# Patient Record
Sex: Female | Born: 1960 | Race: White | Hispanic: No | Marital: Single | Smoking: Never smoker
Health system: Southern US, Community
[De-identification: ages and names within clinical notes are randomized; demographics above are authoritative.]

## PROBLEM LIST (undated history)

## (undated) DIAGNOSIS — G4700 Insomnia, unspecified: Secondary | ICD-10-CM

## (undated) DIAGNOSIS — K219 Gastro-esophageal reflux disease without esophagitis: Secondary | ICD-10-CM

## (undated) DIAGNOSIS — I10 Essential (primary) hypertension: Secondary | ICD-10-CM

## (undated) DIAGNOSIS — C801 Malignant (primary) neoplasm, unspecified: Secondary | ICD-10-CM

## (undated) DIAGNOSIS — J302 Other seasonal allergic rhinitis: Secondary | ICD-10-CM

## (undated) DIAGNOSIS — Z923 Personal history of irradiation: Secondary | ICD-10-CM

## (undated) DIAGNOSIS — Z9221 Personal history of antineoplastic chemotherapy: Secondary | ICD-10-CM

## (undated) HISTORY — PX: FOOT SURGERY: SHX648

## (undated) HISTORY — PX: CYST REMOVAL NECK: SHX6281

---

## 1999-12-04 ENCOUNTER — Encounter: Admission: RE | Admit: 1999-12-04 | Discharge: 1999-12-04 | Payer: Self-pay | Admitting: Obstetrics and Gynecology

## 1999-12-04 ENCOUNTER — Encounter: Payer: Self-pay | Admitting: Obstetrics and Gynecology

## 1999-12-08 ENCOUNTER — Encounter: Payer: Self-pay | Admitting: Obstetrics and Gynecology

## 1999-12-08 ENCOUNTER — Encounter: Admission: RE | Admit: 1999-12-08 | Discharge: 1999-12-08 | Payer: Self-pay | Admitting: Obstetrics and Gynecology

## 2000-01-08 ENCOUNTER — Other Ambulatory Visit: Admission: RE | Admit: 2000-01-08 | Discharge: 2000-01-08 | Payer: Self-pay | Admitting: Obstetrics and Gynecology

## 2000-01-09 ENCOUNTER — Encounter: Payer: Self-pay | Admitting: Obstetrics and Gynecology

## 2000-01-09 ENCOUNTER — Ambulatory Visit (HOSPITAL_COMMUNITY): Admission: RE | Admit: 2000-01-09 | Discharge: 2000-01-09 | Payer: Self-pay | Admitting: Obstetrics and Gynecology

## 2000-03-20 ENCOUNTER — Ambulatory Visit (HOSPITAL_COMMUNITY): Admission: RE | Admit: 2000-03-20 | Discharge: 2000-03-20 | Payer: Self-pay | Admitting: Obstetrics and Gynecology

## 2000-03-20 ENCOUNTER — Encounter: Payer: Self-pay | Admitting: Obstetrics and Gynecology

## 2000-04-04 ENCOUNTER — Other Ambulatory Visit: Admission: RE | Admit: 2000-04-04 | Discharge: 2000-04-04 | Payer: Self-pay | Admitting: Obstetrics and Gynecology

## 2000-04-04 ENCOUNTER — Encounter (INDEPENDENT_AMBULATORY_CARE_PROVIDER_SITE_OTHER): Payer: Self-pay | Admitting: Specialist

## 2001-03-07 ENCOUNTER — Other Ambulatory Visit: Admission: RE | Admit: 2001-03-07 | Discharge: 2001-03-07 | Payer: Self-pay | Admitting: Obstetrics and Gynecology

## 2002-01-16 ENCOUNTER — Encounter: Payer: Self-pay | Admitting: Obstetrics and Gynecology

## 2002-01-16 ENCOUNTER — Encounter: Admission: RE | Admit: 2002-01-16 | Discharge: 2002-01-16 | Payer: Self-pay | Admitting: Obstetrics and Gynecology

## 2002-05-22 ENCOUNTER — Other Ambulatory Visit: Admission: RE | Admit: 2002-05-22 | Discharge: 2002-05-22 | Payer: Self-pay | Admitting: Obstetrics and Gynecology

## 2003-03-24 ENCOUNTER — Encounter: Admission: RE | Admit: 2003-03-24 | Discharge: 2003-03-24 | Payer: Self-pay | Admitting: Obstetrics and Gynecology

## 2003-05-28 ENCOUNTER — Other Ambulatory Visit: Admission: RE | Admit: 2003-05-28 | Discharge: 2003-05-28 | Payer: Self-pay | Admitting: Obstetrics and Gynecology

## 2004-06-12 ENCOUNTER — Encounter: Admission: RE | Admit: 2004-06-12 | Discharge: 2004-06-12 | Payer: Self-pay | Admitting: Obstetrics and Gynecology

## 2004-08-11 ENCOUNTER — Other Ambulatory Visit: Admission: RE | Admit: 2004-08-11 | Discharge: 2004-08-11 | Payer: Self-pay | Admitting: Obstetrics and Gynecology

## 2005-07-27 ENCOUNTER — Encounter: Admission: RE | Admit: 2005-07-27 | Discharge: 2005-07-27 | Payer: Self-pay | Admitting: Obstetrics and Gynecology

## 2005-10-12 ENCOUNTER — Other Ambulatory Visit: Admission: RE | Admit: 2005-10-12 | Discharge: 2005-10-12 | Payer: Self-pay | Admitting: Obstetrics and Gynecology

## 2006-09-13 ENCOUNTER — Encounter: Admission: RE | Admit: 2006-09-13 | Discharge: 2006-09-13 | Payer: Self-pay | Admitting: Obstetrics and Gynecology

## 2007-10-14 ENCOUNTER — Encounter: Admission: RE | Admit: 2007-10-14 | Discharge: 2007-10-14 | Payer: Self-pay | Admitting: Obstetrics and Gynecology

## 2009-05-10 ENCOUNTER — Encounter: Admission: RE | Admit: 2009-05-10 | Discharge: 2009-05-10 | Payer: Self-pay | Admitting: Obstetrics and Gynecology

## 2010-05-24 ENCOUNTER — Other Ambulatory Visit: Payer: Self-pay | Admitting: Obstetrics and Gynecology

## 2010-05-24 DIAGNOSIS — Z1231 Encounter for screening mammogram for malignant neoplasm of breast: Secondary | ICD-10-CM

## 2010-05-31 ENCOUNTER — Ambulatory Visit
Admission: RE | Admit: 2010-05-31 | Discharge: 2010-05-31 | Disposition: A | Discharge status: Home / Self Care | Payer: BC Managed Care – PPO | Source: Ambulatory Visit | Attending: Obstetrics and Gynecology | Admitting: Obstetrics and Gynecology

## 2010-05-31 DIAGNOSIS — Z1231 Encounter for screening mammogram for malignant neoplasm of breast: Secondary | ICD-10-CM

## 2011-06-27 ENCOUNTER — Other Ambulatory Visit: Payer: Self-pay | Admitting: Obstetrics and Gynecology

## 2011-06-27 DIAGNOSIS — Z1231 Encounter for screening mammogram for malignant neoplasm of breast: Secondary | ICD-10-CM

## 2011-07-02 ENCOUNTER — Other Ambulatory Visit: Payer: Self-pay | Admitting: Obstetrics and Gynecology

## 2011-07-02 DIAGNOSIS — Z780 Asymptomatic menopausal state: Secondary | ICD-10-CM

## 2011-07-13 ENCOUNTER — Ambulatory Visit: Payer: BC Managed Care – PPO

## 2011-07-20 ENCOUNTER — Ambulatory Visit
Admission: RE | Admit: 2011-07-20 | Discharge: 2011-07-20 | Disposition: A | Discharge status: Home / Self Care | Payer: Managed Care, Other (non HMO) | Source: Ambulatory Visit | Attending: Obstetrics and Gynecology | Admitting: Obstetrics and Gynecology

## 2011-07-20 DIAGNOSIS — Z1231 Encounter for screening mammogram for malignant neoplasm of breast: Secondary | ICD-10-CM

## 2011-07-20 DIAGNOSIS — Z780 Asymptomatic menopausal state: Secondary | ICD-10-CM

## 2012-06-30 ENCOUNTER — Other Ambulatory Visit: Payer: Self-pay

## 2012-06-30 DIAGNOSIS — Z1231 Encounter for screening mammogram for malignant neoplasm of breast: Secondary | ICD-10-CM

## 2012-07-21 ENCOUNTER — Ambulatory Visit
Admission: RE | Admit: 2012-07-21 | Discharge: 2012-07-21 | Disposition: A | Discharge status: Home / Self Care | Payer: Managed Care, Other (non HMO) | Source: Ambulatory Visit

## 2012-07-21 DIAGNOSIS — Z1231 Encounter for screening mammogram for malignant neoplasm of breast: Secondary | ICD-10-CM

## 2013-07-02 ENCOUNTER — Other Ambulatory Visit: Payer: Self-pay

## 2013-07-02 DIAGNOSIS — Z1231 Encounter for screening mammogram for malignant neoplasm of breast: Secondary | ICD-10-CM

## 2013-07-27 ENCOUNTER — Encounter (INDEPENDENT_AMBULATORY_CARE_PROVIDER_SITE_OTHER): Payer: Self-pay

## 2013-07-27 ENCOUNTER — Ambulatory Visit
Admission: RE | Admit: 2013-07-27 | Discharge: 2013-07-27 | Disposition: A | Discharge status: Home / Self Care | Payer: BC Managed Care – PPO | Source: Ambulatory Visit

## 2013-07-27 DIAGNOSIS — Z1231 Encounter for screening mammogram for malignant neoplasm of breast: Secondary | ICD-10-CM

## 2014-08-16 ENCOUNTER — Other Ambulatory Visit: Payer: Self-pay

## 2014-08-16 DIAGNOSIS — Z803 Family history of malignant neoplasm of breast: Secondary | ICD-10-CM

## 2014-08-16 DIAGNOSIS — Z1231 Encounter for screening mammogram for malignant neoplasm of breast: Secondary | ICD-10-CM

## 2014-09-27 ENCOUNTER — Ambulatory Visit
Admission: RE | Admit: 2014-09-27 | Discharge: 2014-09-27 | Disposition: A | Discharge status: Home / Self Care | Payer: BLUE CROSS/BLUE SHIELD | Source: Ambulatory Visit

## 2014-09-27 DIAGNOSIS — Z803 Family history of malignant neoplasm of breast: Secondary | ICD-10-CM

## 2014-09-27 DIAGNOSIS — Z1231 Encounter for screening mammogram for malignant neoplasm of breast: Secondary | ICD-10-CM

## 2015-11-11 ENCOUNTER — Other Ambulatory Visit: Payer: Self-pay | Admitting: Obstetrics and Gynecology

## 2015-11-11 DIAGNOSIS — Z1231 Encounter for screening mammogram for malignant neoplasm of breast: Secondary | ICD-10-CM

## 2015-12-29 ENCOUNTER — Ambulatory Visit
Admission: RE | Admit: 2015-12-29 | Discharge: 2015-12-29 | Disposition: A | Discharge status: Home / Self Care | Payer: BLUE CROSS/BLUE SHIELD | Source: Ambulatory Visit | Attending: Obstetrics and Gynecology | Admitting: Obstetrics and Gynecology

## 2015-12-29 DIAGNOSIS — Z1231 Encounter for screening mammogram for malignant neoplasm of breast: Secondary | ICD-10-CM

## 2016-11-19 ENCOUNTER — Other Ambulatory Visit: Payer: Self-pay | Admitting: Obstetrics and Gynecology

## 2016-11-19 DIAGNOSIS — Z139 Encounter for screening, unspecified: Secondary | ICD-10-CM

## 2016-12-31 ENCOUNTER — Ambulatory Visit
Admission: RE | Admit: 2016-12-31 | Discharge: 2016-12-31 | Disposition: A | Discharge status: Home / Self Care | Payer: BLUE CROSS/BLUE SHIELD | Source: Ambulatory Visit | Attending: Obstetrics and Gynecology | Admitting: Obstetrics and Gynecology

## 2016-12-31 DIAGNOSIS — Z139 Encounter for screening, unspecified: Secondary | ICD-10-CM

## 2018-01-15 ENCOUNTER — Other Ambulatory Visit: Payer: Self-pay | Admitting: Obstetrics and Gynecology

## 2018-01-15 DIAGNOSIS — Z1231 Encounter for screening mammogram for malignant neoplasm of breast: Secondary | ICD-10-CM

## 2018-02-24 ENCOUNTER — Ambulatory Visit: Payer: 59

## 2018-02-24 ENCOUNTER — Ambulatory Visit
Admission: RE | Admit: 2018-02-24 | Discharge: 2018-02-24 | Disposition: A | Discharge status: Home / Self Care | Payer: Managed Care, Other (non HMO) | Source: Ambulatory Visit | Attending: Obstetrics and Gynecology | Admitting: Obstetrics and Gynecology

## 2018-02-24 DIAGNOSIS — Z1231 Encounter for screening mammogram for malignant neoplasm of breast: Secondary | ICD-10-CM

## 2018-02-26 ENCOUNTER — Other Ambulatory Visit: Payer: Self-pay | Admitting: Obstetrics and Gynecology

## 2018-02-26 DIAGNOSIS — R928 Other abnormal and inconclusive findings on diagnostic imaging of breast: Secondary | ICD-10-CM

## 2018-02-28 ENCOUNTER — Ambulatory Visit
Admission: RE | Admit: 2018-02-28 | Discharge: 2018-02-28 | Disposition: A | Discharge status: Home / Self Care | Payer: Managed Care, Other (non HMO) | Source: Ambulatory Visit | Attending: Obstetrics and Gynecology | Admitting: Obstetrics and Gynecology

## 2018-02-28 ENCOUNTER — Other Ambulatory Visit: Payer: Self-pay | Admitting: Obstetrics and Gynecology

## 2018-02-28 DIAGNOSIS — R928 Other abnormal and inconclusive findings on diagnostic imaging of breast: Secondary | ICD-10-CM

## 2018-02-28 DIAGNOSIS — N6310 Unspecified lump in the right breast, unspecified quadrant: Secondary | ICD-10-CM

## 2018-03-02 DIAGNOSIS — C801 Malignant (primary) neoplasm, unspecified: Secondary | ICD-10-CM

## 2018-03-02 HISTORY — DX: Malignant (primary) neoplasm, unspecified: C80.1

## 2018-03-04 ENCOUNTER — Other Ambulatory Visit: Payer: Managed Care, Other (non HMO)

## 2018-03-06 ENCOUNTER — Ambulatory Visit
Admission: RE | Admit: 2018-03-06 | Discharge: 2018-03-06 | Disposition: A | Discharge status: Home / Self Care | Payer: Managed Care, Other (non HMO) | Source: Ambulatory Visit | Attending: Obstetrics and Gynecology | Admitting: Obstetrics and Gynecology

## 2018-03-06 DIAGNOSIS — N6310 Unspecified lump in the right breast, unspecified quadrant: Secondary | ICD-10-CM

## 2018-03-17 ENCOUNTER — Other Ambulatory Visit: Payer: Self-pay | Admitting: General Surgery

## 2018-03-17 ENCOUNTER — Other Ambulatory Visit: Payer: Self-pay

## 2018-03-17 ENCOUNTER — Encounter (HOSPITAL_BASED_OUTPATIENT_CLINIC_OR_DEPARTMENT_OTHER)
Admission: RE | Admit: 2018-03-17 | Discharge: 2018-03-17 | Disposition: A | Discharge status: Home / Self Care | Payer: Managed Care, Other (non HMO) | Source: Ambulatory Visit | Attending: General Surgery | Admitting: General Surgery

## 2018-03-17 ENCOUNTER — Encounter (HOSPITAL_BASED_OUTPATIENT_CLINIC_OR_DEPARTMENT_OTHER): Payer: Self-pay | Admitting: *Deleted

## 2018-03-17 DIAGNOSIS — C50311 Malignant neoplasm of lower-inner quadrant of right female breast: Secondary | ICD-10-CM

## 2018-03-17 DIAGNOSIS — Z01818 Encounter for other preprocedural examination: Secondary | ICD-10-CM | POA: Diagnosis not present

## 2018-03-17 LAB — BASIC METABOLIC PANEL
Anion gap: 9 (ref 5–15)
BUN: 14 mg/dL (ref 6–20)
CO2: 26 mmol/L (ref 22–32)
Calcium: 9.7 mg/dL (ref 8.9–10.3)
Chloride: 101 mmol/L (ref 98–111)
Creatinine, Ser: 0.76 mg/dL (ref 0.44–1.00)
GFR calc Af Amer: >60 mL/min (ref >60)
GFR calc non Af Amer: >60 mL/min (ref >60)
Glucose, Bld: 125 mg/dL — ABNORMAL HIGH (ref 70–99)
Potassium: 2.9 mmol/L — ABNORMAL LOW (ref 3.5–5.1)
Sodium: 136 mmol/L (ref 135–145)

## 2018-03-17 NOTE — Progress Notes (Signed)
Instructed Kim Montgomery to drink Ensure by 1030 day of surgery with teach back method. Instructed on using CHG soap as well.

## 2018-03-18 ENCOUNTER — Telehealth: Payer: Self-pay | Admitting: Hematology and Oncology

## 2018-03-18 NOTE — Progress Notes (Signed)
K+ 2.9, Dr. Eligha Bridegroom instructed pt to take potassium chloride SA TID until surgery, notified pt.  Pt verbalized understanding. EKG reviewed, will proceed with surgery as scheduled.

## 2018-03-18 NOTE — Telephone Encounter (Signed)
Pt has been cld and scheduled to see Dr. Lindi Adie on 3/18 at 330pm. Pt aware to arrive 15 minutes early.

## 2018-03-18 NOTE — Progress Notes (Signed)
[facility] CONSULT NOTE  Patient Care Team: [name], MD as PCP - General (Family Medicine)  CHIEF COMPLAINTS/PURPOSE OF CONSULTATION: Newly diagnosed breast cancer  HISTORY OF PRESENTING ILLNESS:  Kim Montgomery 58 y.o. female is here because of recent diagnosis of right breast cancer.  The cancer was detected on a screening mammogram on 02/24/18. A diagnostic mammogram on 02/28/18 showed an 6m mass in the right breast. A biopsy from 03/06/18 showed the cancer to be grade 2 IDC with DCIS, HER2 positive, ER 100%, PR 1%, Ki67 30%.   She presents to the clinic today to discuss her treatment plan.  She met with Dr. WDonne Hazelwho plans for surgery next Friday.   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  She reports/denies a family history of breast cancer. Her lumpectomy is shceduled for 3/23/20with Dr. WDonne Hazel   I reviewed her records extensively and collaborated the history with the patient.  SUMMARY OF ONCOLOGIC HISTORY:   Malignant neoplasm of lower-outer quadrant of right breast of female, estrogen receptor positive ([place]   03/06/2018 Initial Diagnosis    Screening mammogram detected right breast calcifications, 8 mm mass detected by ultrasound, axilla negative, biopsy revealed grade 2 IDC with high-grade DCIS, ER 100%, PR 1%, Ki-67 30%, HER-2 +3+ by IHC, T1BN0 stage Ia    03/19/2018 Cancer Staging    Staging form: Breast, AJCC 8th Edition - Clinical stage from 03/19/2018: Stage IA (cT1b, cN0, cM0, G2, ER+, PR+, HER2+) - Signed by GNicholas Lose MD on 03/19/2018      MEDICAL HISTORY:  Past Medical History:  Diagnosis Date  . Cancer ([place] 03/2018   right breast cancer  . GERD (gastroesophageal reflux disease)   . Hypertension   . Insomnia   . Seasonal allergies     SURGICAL HISTORY: Past Surgical History:  Procedure Laterality Date  . CYST REMOVAL NECK     at age 58 . FOOT SURGERY Right     SOCIAL  HISTORY: Social History   Socioeconomic History  . Marital status: Single    Spouse name: Not on file  . Number of children: Not on file  . Years of education: Not on file  . Highest education level: Not on file  Occupational History  . Not on file  Social Needs  . Financial resource strain: Not on file  . Food insecurity:    Worry: Not on file    Inability: Not on file  . Transportation needs:    Medical: Not on file    Non-medical: Not on file  Tobacco Use  . Smoking status: Never Smoker  . Smokeless tobacco: Never Used  Substance and Sexual Activity  . Alcohol use: Yes    Comment: social  . Drug use: Never  . Sexual activity: Not on file  Lifestyle  . Physical activity:    Days per week: Not on file    Minutes per session: Not on file  . Stress: Not on file  Relationships  . Social connections:    Talks on phone: Not on file    Gets together: Not on file    Attends religious service: Not on file    Active member of club or organization: Not on file    Attends meetings of clubs or organizations: Not on file    Relationship status: Not on file  . Intimate partner violence:    Fear of current or ex  partner: Not on file    Emotionally abused: Not on file    Physically abused: Not on file    Forced sexual activity: Not on file  Other Topics Concern  . Not on file  Social History Narrative  . Not on file    FAMILY HISTORY: Family History  Problem Relation Age of Onset  . Breast cancer Sister        early to mid 94's    ALLERGIES:  has No Known Allergies.  MEDICATIONS:  Current Outpatient Medications  Medication Sig Dispense Refill  . albuterol (PROVENTIL HFA;VENTOLIN HFA) 108 (90 Base) MCG/ACT inhaler Inhale into the lungs every 6 (six) hours as needed for wheezing or shortness of breath.    [name] atenolol-chlorthalidone (TENORETIC) 100-25 MG tablet Take 1 tablet by mouth daily.    . clonazePAM (KLONOPIN) 0.5 MG tablet Take 0.5 mg by mouth at bedtime.    [name]  omeprazole (PRILOSEC) 20 MG capsule Take 20 mg by mouth daily.    . potassium chloride SA (K-DUR,KLOR-CON) 20 MEQ tablet Take 20 mEq by mouth 2 (two) times daily.     No current facility-administered medications for this visit.     REVIEW OF SYSTEMS:   Constitutional: Denies fevers, chills or abnormal night sweats Eyes: Denies blurriness of vision, double vision or watery eyes Ears, nose, mouth, throat, and face: Denies mucositis or sore throat Respiratory: Denies cough, dyspnea or wheezes Cardiovascular: Denies palpitation, chest discomfort or lower extremity swelling Gastrointestinal:  Denies nausea, heartburn or change in bowel habits Skin: Denies abnormal skin rashes Lymphatics: Denies new lymphadenopathy or easy bruising Neurological:Denies numbness, tingling or new weaknesses Behavioral/Psych: Mood is stable, no new changes  Breast:  Denies any palpable lumps or discharge All other systems were reviewed with the patient and are negative.  PHYSICAL EXAMINATION: ECOG PERFORMANCE STATUS: 0 - Asymptomatic  Vitals:   03/19/18 1538  BP: 122/72  Pulse: 74  Resp: 18  Temp: 99 F (37.2 C)  SpO2: 97%   Filed Weights   03/19/18 1538  Weight: 156 lb 8 oz (71 kg)    GENERAL:alert, no distress and comfortable SKIN: skin color, texture, turgor are normal, no rashes or significant lesions EYES: normal, conjunctiva are pink and non-injected, sclera clear OROPHARYNX:no exudate, no erythema and lips, buccal mucosa, and tongue normal  NECK: supple, thyroid normal size, non-tender, without nodularity LYMPH:  no palpable lymphadenopathy in the cervical, axillary or inguinal LUNGS: clear to auscultation and percussion with normal breathing effort HEART: regular rate & rhythm and no murmurs and no lower extremity edema ABDOMEN:abdomen soft, non-tender and normal bowel sounds Musculoskeletal:no cyanosis of digits and no clubbing  PSYCH: alert & oriented x 3 with fluent speech NEURO: no  focal motor/sensory deficits BREAST: No palpable nodules in breast. No palpable axillary or supraclavicular lymphadenopathy (exam performed in the presence of a chaperone)   LABORATORY DATA:  I have reviewed the data as listed No results found for: WBC, HGB, HCT, MCV, PLT Lab Results  Component Value Date   NA 136 03/17/2018   K 2.9 (L) 03/17/2018   CL 101 03/17/2018   CO2 26 03/17/2018    RADIOGRAPHIC STUDIES: I have personally reviewed the radiological reports and agreed with the findings in the report.  ASSESSMENT AND PLAN:  Malignant neoplasm of lower-outer quadrant of right breast of female, estrogen receptor positive ([place]) 03/06/2018:Screening mammogram detected right breast calcifications, 8 mm mass detected by ultrasound, axilla negative, biopsy revealed grade 2 IDC with high-grade DCIS,  ER 100%, PR 1%, Ki-67 30%, HER-2 +3+ by IHC, T1BN0 stage Ia  Pathology and radiology counseling: Discussed with the patient, the details of pathology including the type of breast cancer,the clinical staging, the significance of ER, PR and HER-2/neu receptors and the implications for treatment. After reviewing the pathology in detail, we proceeded to discuss the different treatment options between surgery, radiation, chemotherapy, antiestrogen therapies.  Recommendation: 1.  Breast conserving surgery with sentinel lymph node biopsy 2.  Adjuvant chemotherapy with Herceptin followed by 1 year of Herceptin maintenance if the final tumor is greater than 5 mm. 3.  Adjuvant radiation therapy 4.  Followed by adjuvant antiestrogen therapy  I briefly discussed the risks and benefits of chemotherapy with Taxol Herceptin and Herceptin maintenance.  We will await the results of the final surgery to make a determination on benefits of chemo. Patient is very keen on receiving chemotherapy and she has been planning for working through the chemo by working from home. Return to clinic after surgery to discuss her  treatment plan.    All questions were answered. The patient knows to call the clinic with any problems, questions or concerns.   Nicholas Lose, MD 03/19/2018   I, Cloyde Reams Dorshimer, am acting as scribe for Nicholas Lose, MD.  I have reviewed the above documentation for accuracy and completeness, and I agree with the above.

## 2018-03-19 ENCOUNTER — Other Ambulatory Visit: Payer: Self-pay

## 2018-03-19 ENCOUNTER — Inpatient Hospital Stay: Payer: Managed Care, Other (non HMO) | Attending: Hematology and Oncology | Admitting: Hematology and Oncology

## 2018-03-19 DIAGNOSIS — I10 Essential (primary) hypertension: Secondary | ICD-10-CM | POA: Insufficient documentation

## 2018-03-19 DIAGNOSIS — Z170 Estrogen receptor positive status [ER+]: Secondary | ICD-10-CM | POA: Diagnosis not present

## 2018-03-19 DIAGNOSIS — Z79899 Other long term (current) drug therapy: Secondary | ICD-10-CM | POA: Insufficient documentation

## 2018-03-19 DIAGNOSIS — C50511 Malignant neoplasm of lower-outer quadrant of right female breast: Secondary | ICD-10-CM | POA: Diagnosis not present

## 2018-03-19 NOTE — Assessment & Plan Note (Signed)
03/06/2018:Screening mammogram detected right breast calcifications, 8 mm mass detected by ultrasound, axilla negative, biopsy revealed grade 2 IDC with high-grade DCIS, ER 100%, PR 1%, Ki-67 30%, HER-2 +3+ by IHC, T1BN0 stage Ia  Pathology and radiology counseling: Discussed with the patient, the details of pathology including the type of breast cancer,the clinical staging, the significance of ER, PR and HER-2/neu receptors and the implications for treatment. After reviewing the pathology in detail, we proceeded to discuss the different treatment options between surgery, radiation, chemotherapy, antiestrogen therapies.  Recommendation: 1.  Breast conserving surgery with sentinel lymph node biopsy 2.  Adjuvant chemotherapy with Herceptin followed by 1 year of Herceptin maintenance if the final tumor is greater than 5 mm. 3.  Adjuvant radiation therapy 4.  Followed by adjuvant antiestrogen therapy  I briefly discussed the risks and benefits of chemotherapy with Taxol Herceptin and Herceptin maintenance.  We will await the results of the final surgery to make a determination on benefits of chemo.  Return to clinic after surgery to discuss her treatment plan.

## 2018-03-20 ENCOUNTER — Encounter: Payer: Self-pay | Admitting: *Deleted

## 2018-03-21 ENCOUNTER — Ambulatory Visit
Admission: RE | Admit: 2018-03-21 | Discharge: 2018-03-21 | Disposition: A | Discharge status: Home / Self Care | Payer: Managed Care, Other (non HMO) | Source: Ambulatory Visit | Attending: General Surgery | Admitting: General Surgery

## 2018-03-21 ENCOUNTER — Inpatient Hospital Stay: Admission: RE | Admit: 2018-03-21 | Payer: Managed Care, Other (non HMO) | Source: Ambulatory Visit

## 2018-03-21 ENCOUNTER — Other Ambulatory Visit: Payer: Self-pay

## 2018-03-21 DIAGNOSIS — C50311 Malignant neoplasm of lower-inner quadrant of right female breast: Secondary | ICD-10-CM

## 2018-03-23 NOTE — Anesthesia Preprocedure Evaluation (Addendum)
Anesthesia Evaluation  Patient identified by MRN, date of birth, ID band Patient awake    Reviewed: Allergy & Precautions, NPO status , Patient's Chart, lab work & pertinent test results  Airway Mallampati: II  TM Distance: >3 FB Neck ROM: Full    Dental no notable dental hx. (+) Teeth Intact, Dental Advisory Given   Pulmonary neg pulmonary ROS,    Pulmonary exam normal breath sounds clear to auscultation       Cardiovascular hypertension, Pt. on medications Normal cardiovascular exam Rhythm:Regular Rate:Normal     Neuro/Psych negative neurological ROS     GI/Hepatic Neg liver ROS,   Endo/Other  negative endocrine ROS  Renal/GU negative Renal ROS     Musculoskeletal   Abdominal   Peds  Hematology   Anesthesia Other Findings   Reproductive/Obstetrics                            Anesthesia Physical Anesthesia Plan  ASA: II  Anesthesia Plan: General   Post-op Pain Management:  Regional for Post-op pain   Induction:   PONV Risk Score and Plan: 3 and Treatment may vary due to age or medical condition and Scopolamine patch - Pre-op  Airway Management Planned: LMA  Additional Equipment:   Intra-op Plan:   Post-operative Plan: Extubation in OR  Informed Consent: I have reviewed the patients History and Physical, chart, labs and discussed the procedure including the risks, benefits and alternatives for the proposed anesthesia with the patient or authorized representative who has indicated his/her understanding and acceptance.     Dental advisory given  Plan Discussed with: CRNA  Anesthesia Plan Comments: (R breast lumpectomy w R axillary w Pecs block)       Anesthesia Quick Evaluation

## 2018-03-24 ENCOUNTER — Ambulatory Visit (HOSPITAL_BASED_OUTPATIENT_CLINIC_OR_DEPARTMENT_OTHER): Payer: Managed Care, Other (non HMO) | Admitting: Anesthesiology

## 2018-03-24 ENCOUNTER — Encounter (HOSPITAL_BASED_OUTPATIENT_CLINIC_OR_DEPARTMENT_OTHER): Payer: Self-pay | Admitting: Certified Registered"

## 2018-03-24 ENCOUNTER — Ambulatory Visit (HOSPITAL_COMMUNITY)
Admission: RE | Admit: 2018-03-24 | Discharge: 2018-03-24 | Disposition: A | Discharge status: Home / Self Care | Payer: Managed Care, Other (non HMO) | Source: Ambulatory Visit | Attending: General Surgery | Admitting: General Surgery

## 2018-03-24 ENCOUNTER — Ambulatory Visit (HOSPITAL_COMMUNITY)
Admission: RE | Admit: 2018-03-24 | Discharge: 2018-03-24 | Disposition: A | Discharge status: Home / Self Care | Payer: Managed Care, Other (non HMO) | Attending: General Surgery | Admitting: General Surgery

## 2018-03-24 ENCOUNTER — Ambulatory Visit
Admission: RE | Admit: 2018-03-24 | Discharge: 2018-03-24 | Disposition: A | Discharge status: Home / Self Care | Payer: Managed Care, Other (non HMO) | Source: Ambulatory Visit | Attending: General Surgery | Admitting: General Surgery

## 2018-03-24 ENCOUNTER — Encounter (HOSPITAL_BASED_OUTPATIENT_CLINIC_OR_DEPARTMENT_OTHER)
Admission: RE | Disposition: A | Discharge status: Home / Self Care | Payer: Self-pay | Source: Home / Self Care | Attending: General Surgery

## 2018-03-24 ENCOUNTER — Other Ambulatory Visit: Payer: Self-pay

## 2018-03-24 DIAGNOSIS — C50911 Malignant neoplasm of unspecified site of right female breast: Secondary | ICD-10-CM | POA: Insufficient documentation

## 2018-03-24 DIAGNOSIS — Z803 Family history of malignant neoplasm of breast: Secondary | ICD-10-CM | POA: Diagnosis not present

## 2018-03-24 DIAGNOSIS — Z79899 Other long term (current) drug therapy: Secondary | ICD-10-CM | POA: Insufficient documentation

## 2018-03-24 DIAGNOSIS — I10 Essential (primary) hypertension: Secondary | ICD-10-CM | POA: Diagnosis not present

## 2018-03-24 DIAGNOSIS — K219 Gastro-esophageal reflux disease without esophagitis: Secondary | ICD-10-CM | POA: Insufficient documentation

## 2018-03-24 DIAGNOSIS — C50311 Malignant neoplasm of lower-inner quadrant of right female breast: Secondary | ICD-10-CM

## 2018-03-24 HISTORY — DX: Gastro-esophageal reflux disease without esophagitis: K21.9

## 2018-03-24 HISTORY — PX: BREAST LUMPECTOMY: SHX2

## 2018-03-24 HISTORY — DX: Other seasonal allergic rhinitis: J30.2

## 2018-03-24 HISTORY — PX: BREAST LUMPECTOMY WITH RADIOACTIVE SEED AND SENTINEL LYMPH NODE BIOPSY: SHX6550

## 2018-03-24 HISTORY — DX: Malignant (primary) neoplasm, unspecified: C80.1

## 2018-03-24 HISTORY — DX: Essential (primary) hypertension: I10

## 2018-03-24 HISTORY — DX: Insomnia, unspecified: G47.00

## 2018-03-24 LAB — BASIC METABOLIC PANEL
Anion gap: 10 (ref 5–15)
BUN: 10 mg/dL (ref 6–20)
CO2: 25 mmol/L (ref 22–32)
Calcium: 9.5 mg/dL (ref 8.9–10.3)
Chloride: 102 mmol/L (ref 98–111)
Creatinine, Ser: 0.75 mg/dL (ref 0.44–1.00)
GFR calc Af Amer: >60 mL/min (ref >60)
GFR calc non Af Amer: >60 mL/min (ref >60)
Glucose, Bld: 84 mg/dL (ref 70–99)
Potassium: 3.3 mmol/L — ABNORMAL LOW (ref 3.5–5.1)
Sodium: 137 mmol/L (ref 135–145)

## 2018-03-24 SURGERY — BREAST LUMPECTOMY WITH RADIOACTIVE SEED AND SENTINEL LYMPH NODE BIOPSY
Anesthesia: General | Site: Breast | Laterality: Right

## 2018-03-24 MED ORDER — SCOPOLAMINE 1 MG/3DAYS TD PT72
1.0000 | MEDICATED_PATCH | Freq: Once | TRANSDERMAL | Status: DC | PRN
  Administered 2018-03-24: 1.5 mg via TRANSDERMAL

## 2018-03-24 MED ORDER — PROPOFOL 10 MG/ML IV BOLUS
INTRAVENOUS | Status: AC
  Filled 2018-03-24: qty 20

## 2018-03-24 MED ORDER — GABAPENTIN 100 MG PO CAPS
ORAL_CAPSULE | ORAL | Status: AC
  Filled 2018-03-24: qty 1

## 2018-03-24 MED ORDER — ACETAMINOPHEN 10 MG/ML IV SOLN: 1000.0000 mg | Freq: Once | INTRAVENOUS | Status: DC | PRN

## 2018-03-24 MED ORDER — EPHEDRINE SULFATE 50 MG/ML IJ SOLN
INTRAMUSCULAR | Status: DC | PRN
  Administered 2018-03-24: 10 mg via INTRAVENOUS

## 2018-03-24 MED ORDER — GABAPENTIN 100 MG PO CAPS
100.0000 mg | ORAL_CAPSULE | ORAL | Status: AC
  Administered 2018-03-24: 100 mg via ORAL

## 2018-03-24 MED ORDER — BUPIVACAINE HCL (PF) 0.25 % IJ SOLN
INTRAMUSCULAR | Status: DC | PRN
  Administered 2018-03-24: 9 mL

## 2018-03-24 MED ORDER — LACTATED RINGERS IV SOLN
INTRAVENOUS | Status: DC
  Administered 2018-03-24: 13:00:00 via INTRAVENOUS

## 2018-03-24 MED ORDER — PROPOFOL 10 MG/ML IV BOLUS
INTRAVENOUS | Status: DC | PRN
  Administered 2018-03-24: 150 mg via INTRAVENOUS

## 2018-03-24 MED ORDER — PROPOFOL 500 MG/50ML IV EMUL
INTRAVENOUS | Status: AC
  Filled 2018-03-24: qty 50

## 2018-03-24 MED ORDER — MIDAZOLAM HCL 2 MG/2ML IJ SOLN
1.0000 mg | INTRAMUSCULAR | Status: DC | PRN
  Administered 2018-03-24: 1 mg via INTRAVENOUS

## 2018-03-24 MED ORDER — MIDAZOLAM HCL 2 MG/2ML IJ SOLN
INTRAMUSCULAR | Status: AC
  Filled 2018-03-24: qty 2

## 2018-03-24 MED ORDER — LIDOCAINE 2% (20 MG/ML) 5 ML SYRINGE
INTRAMUSCULAR | Status: AC
  Filled 2018-03-24: qty 15

## 2018-03-24 MED ORDER — BUPIVACAINE HCL (PF) 0.25 % IJ SOLN
INTRAMUSCULAR | Status: AC
  Filled 2018-03-24: qty 30

## 2018-03-24 MED ORDER — HYDROCODONE-ACETAMINOPHEN 7.5-325 MG PO TABS: 1.0000 | ORAL_TABLET | Freq: Once | ORAL | Status: DC | PRN

## 2018-03-24 MED ORDER — HEPARIN SOD (PORK) LOCK FLUSH 100 UNIT/ML IV SOLN
INTRAVENOUS | Status: AC
  Filled 2018-03-24: qty 5

## 2018-03-24 MED ORDER — DEXAMETHASONE SODIUM PHOSPHATE 10 MG/ML IJ SOLN
INTRAMUSCULAR | Status: AC
  Filled 2018-03-24: qty 2

## 2018-03-24 MED ORDER — PROPOFOL 500 MG/50ML IV EMUL
INTRAVENOUS | Status: DC | PRN
  Administered 2018-03-24: 25 ug/kg/min via INTRAVENOUS

## 2018-03-24 MED ORDER — KETOROLAC TROMETHAMINE 15 MG/ML IJ SOLN
15.0000 mg | INTRAMUSCULAR | Status: AC
  Administered 2018-03-24: 15 mg via INTRAVENOUS

## 2018-03-24 MED ORDER — TECHNETIUM TC 99M SULFUR COLLOID FILTERED
1.0000 | Freq: Once | INTRAVENOUS | Status: AC | PRN
  Administered 2018-03-24: 1 via INTRADERMAL

## 2018-03-24 MED ORDER — HYDROMORPHONE HCL 1 MG/ML IJ SOLN: 0.2500 mg | INTRAMUSCULAR | Status: DC | PRN

## 2018-03-24 MED ORDER — SODIUM CHLORIDE (PF) 0.9 % IJ SOLN
INTRAMUSCULAR | Status: AC
  Filled 2018-03-24: qty 10

## 2018-03-24 MED ORDER — SCOPOLAMINE 1 MG/3DAYS TD PT72
MEDICATED_PATCH | TRANSDERMAL | Status: AC
  Filled 2018-03-24: qty 1

## 2018-03-24 MED ORDER — BUPIVACAINE LIPOSOME 1.3 % IJ SUSP
INTRAMUSCULAR | Status: DC | PRN
  Administered 2018-03-24: 10 mL

## 2018-03-24 MED ORDER — KETOROLAC TROMETHAMINE 15 MG/ML IJ SOLN
INTRAMUSCULAR | Status: AC
  Filled 2018-03-24: qty 1

## 2018-03-24 MED ORDER — CEFAZOLIN SODIUM-DEXTROSE 2-4 GM/100ML-% IV SOLN
INTRAVENOUS | Status: AC
  Filled 2018-03-24: qty 100

## 2018-03-24 MED ORDER — DEXAMETHASONE SODIUM PHOSPHATE 4 MG/ML IJ SOLN
INTRAMUSCULAR | Status: DC | PRN
  Administered 2018-03-24: 10 mg via INTRAVENOUS

## 2018-03-24 MED ORDER — PROMETHAZINE HCL 25 MG/ML IJ SOLN: 6.2500 mg | INTRAMUSCULAR | Status: DC | PRN

## 2018-03-24 MED ORDER — OXYCODONE HCL 5 MG PO TABS: 5.0000 mg | ORAL_TABLET | Freq: Four times a day (QID) | ORAL | 0 refills | Status: DC | PRN

## 2018-03-24 MED ORDER — HEPARIN (PORCINE) IN NACL 1000-0.9 UT/500ML-% IV SOLN
INTRAVENOUS | Status: AC
  Filled 2018-03-24: qty 500

## 2018-03-24 MED ORDER — MEPERIDINE HCL 25 MG/ML IJ SOLN: 6.2500 mg | INTRAMUSCULAR | Status: DC | PRN

## 2018-03-24 MED ORDER — ACETAMINOPHEN 500 MG PO TABS
1000.0000 mg | ORAL_TABLET | ORAL | Status: AC
  Administered 2018-03-24: 1000 mg via ORAL

## 2018-03-24 MED ORDER — LIDOCAINE HCL (CARDIAC) PF 100 MG/5ML IV SOSY
PREFILLED_SYRINGE | INTRAVENOUS | Status: DC | PRN
  Administered 2018-03-24: 30 mg via INTRAVENOUS

## 2018-03-24 MED ORDER — BUPIVACAINE HCL (PF) 0.5 % IJ SOLN
INTRAMUSCULAR | Status: DC | PRN
  Administered 2018-03-24: 16 mL

## 2018-03-24 MED ORDER — FENTANYL CITRATE (PF) 100 MCG/2ML IJ SOLN
50.0000 ug | INTRAMUSCULAR | Status: AC | PRN
  Administered 2018-03-24 (×2): 50 ug via INTRAVENOUS
  Administered 2018-03-24: 25 ug via INTRAVENOUS

## 2018-03-24 MED ORDER — FENTANYL CITRATE (PF) 100 MCG/2ML IJ SOLN
INTRAMUSCULAR | Status: AC
  Filled 2018-03-24: qty 2

## 2018-03-24 MED ORDER — CEFAZOLIN SODIUM-DEXTROSE 2-4 GM/100ML-% IV SOLN
2.0000 g | INTRAVENOUS | Status: AC
  Administered 2018-03-24: 2 g via INTRAVENOUS

## 2018-03-24 MED ORDER — ACETAMINOPHEN 500 MG PO TABS
ORAL_TABLET | ORAL | Status: AC
  Filled 2018-03-24: qty 2

## 2018-03-24 MED ORDER — ONDANSETRON HCL 4 MG/2ML IJ SOLN
INTRAMUSCULAR | Status: AC
  Filled 2018-03-24: qty 8

## 2018-03-24 MED ORDER — METHYLENE BLUE 0.5 % INJ SOLN
INTRAVENOUS | Status: AC
  Filled 2018-03-24: qty 10

## 2018-03-24 SURGICAL SUPPLY — 76 items
ADH SKN CLS APL DERMABOND .7 (GAUZE/BANDAGES/DRESSINGS) ×2
APL PRP STRL LF DISP 70% ISPRP (MISCELLANEOUS) ×2
APL SKNCLS STERI-STRIP NONHPOA (GAUZE/BANDAGES/DRESSINGS) ×2
APPLIER CLIP 9.375 MED OPEN (MISCELLANEOUS)
APR CLP MED 9.3 20 MLT OPN (MISCELLANEOUS)
BAG DECANTER FOR FLEXI CONT (MISCELLANEOUS) ×4 IMPLANT
BENZOIN TINCTURE PRP APPL 2/3 (GAUZE/BANDAGES/DRESSINGS) ×4 IMPLANT
BINDER BREAST LRG (GAUZE/BANDAGES/DRESSINGS) IMPLANT
BINDER BREAST MEDIUM (GAUZE/BANDAGES/DRESSINGS) IMPLANT
BINDER BREAST XLRG (GAUZE/BANDAGES/DRESSINGS) ×3 IMPLANT
BINDER BREAST XXLRG (GAUZE/BANDAGES/DRESSINGS) IMPLANT
BLADE SURG 11 STRL SS (BLADE) ×4 IMPLANT
BLADE SURG 15 STRL LF DISP TIS (BLADE) ×2 IMPLANT
BLADE SURG 15 STRL SS (BLADE) ×4
CANISTER SUC SOCK COL 7IN (MISCELLANEOUS) IMPLANT
CANISTER SUCT 1200ML W/VALVE (MISCELLANEOUS) IMPLANT
CHLORAPREP W/TINT 26 (MISCELLANEOUS) ×4 IMPLANT
CLIP APPLIE 9.375 MED OPEN (MISCELLANEOUS) IMPLANT
CLIP VESOCCLUDE SM WIDE 6/CT (CLIP) ×4 IMPLANT
CLOSURE WOUND 1/2 X4 (GAUZE/BANDAGES/DRESSINGS) ×1
COVER BACK TABLE 60X90IN (DRAPES) ×4 IMPLANT
COVER MAYO STAND STRL (DRAPES) ×4 IMPLANT
COVER PROBE 5X48 (MISCELLANEOUS)
COVER PROBE W GEL 5X96 (DRAPES) ×4 IMPLANT
COVER WAND RF STERILE (DRAPES) IMPLANT
DECANTER SPIKE VIAL GLASS SM (MISCELLANEOUS) IMPLANT
DERMABOND ADVANCED (GAUZE/BANDAGES/DRESSINGS) ×2
DERMABOND ADVANCED .7 DNX12 (GAUZE/BANDAGES/DRESSINGS) ×2 IMPLANT
DRAPE C-ARM 42X72 X-RAY (DRAPES) ×4 IMPLANT
DRAPE LAPAROSCOPIC ABDOMINAL (DRAPES) ×4 IMPLANT
DRAPE UTILITY XL STRL (DRAPES) ×4 IMPLANT
DRSG TEGADERM 4X4.75 (GAUZE/BANDAGES/DRESSINGS) IMPLANT
ELECT COATED BLADE 2.86 ST (ELECTRODE) ×4 IMPLANT
ELECT REM PT RETURN 9FT ADLT (ELECTROSURGICAL) ×4
ELECTRODE REM PT RTRN 9FT ADLT (ELECTROSURGICAL) ×2 IMPLANT
GAUZE SPONGE 4X4 12PLY STRL LF (GAUZE/BANDAGES/DRESSINGS) ×4 IMPLANT
GLOVE BIO SURGEON STRL SZ7 (GLOVE) ×8 IMPLANT
GLOVE BIOGEL PI IND STRL 7.5 (GLOVE) ×6 IMPLANT
GLOVE BIOGEL PI INDICATOR 7.5 (GLOVE) ×6
GOWN STRL REUS W/ TWL LRG LVL3 (GOWN DISPOSABLE) ×4 IMPLANT
GOWN STRL REUS W/TWL LRG LVL3 (GOWN DISPOSABLE) ×8
HEMOSTAT ARISTA ABSORB 3G PWDR (HEMOSTASIS) IMPLANT
ILLUMINATOR WAVEGUIDE N/F (MISCELLANEOUS) ×3 IMPLANT
IV KIT MINILOC 20X1 SAFETY (NEEDLE) IMPLANT
KIT CVR 48X5XPRB PLUP LF (MISCELLANEOUS) IMPLANT
KIT MARKER MARGIN INK (KITS) ×4 IMPLANT
LIGHT WAVEGUIDE WIDE FLAT (MISCELLANEOUS) IMPLANT
NDL HYPO 25X1 1.5 SAFETY (NEEDLE) ×1 IMPLANT
NDL SAFETY ECLIPSE 18X1.5 (NEEDLE) IMPLANT
NEEDLE HYPO 18GX1.5 SHARP (NEEDLE)
NEEDLE HYPO 25X1 1.5 SAFETY (NEEDLE) ×4 IMPLANT
NS IRRIG 1000ML POUR BTL (IV SOLUTION) IMPLANT
PACK BASIN DAY SURGERY FS (CUSTOM PROCEDURE TRAY) ×4 IMPLANT
PENCIL BUTTON HOLSTER BLD 10FT (ELECTRODE) ×7 IMPLANT
SLEEVE SCD COMPRESS KNEE MED (MISCELLANEOUS) ×4 IMPLANT
SPONGE LAP 4X18 RFD (DISPOSABLE) ×4 IMPLANT
STRIP CLOSURE SKIN 1/2X4 (GAUZE/BANDAGES/DRESSINGS) ×3 IMPLANT
SUT ETHILON 2 0 FS 18 (SUTURE) IMPLANT
SUT MNCRL AB 4-0 PS2 18 (SUTURE) ×7 IMPLANT
SUT MON AB 5-0 PS2 18 (SUTURE) IMPLANT
SUT PROLENE 2 0 SH DA (SUTURE) ×4 IMPLANT
SUT SILK 2 0 SH (SUTURE) ×4 IMPLANT
SUT SILK 2 0 TIES 17X18 (SUTURE)
SUT SILK 2-0 18XBRD TIE BLK (SUTURE) IMPLANT
SUT VIC AB 2-0 SH 27 (SUTURE) ×8
SUT VIC AB 2-0 SH 27XBRD (SUTURE) ×3 IMPLANT
SUT VIC AB 3-0 SH 27 (SUTURE) ×8
SUT VIC AB 3-0 SH 27X BRD (SUTURE) ×3 IMPLANT
SUT VIC AB 5-0 PS2 18 (SUTURE) IMPLANT
SYR 5ML LUER SLIP (SYRINGE) ×4 IMPLANT
SYR CONTROL 10ML LL (SYRINGE) ×4 IMPLANT
TOWEL GREEN STERILE FF (TOWEL DISPOSABLE) ×4 IMPLANT
TRAY FAXITRON CT DISP (TRAY / TRAY PROCEDURE) ×4 IMPLANT
TUBE CONNECTING 20'X1/4 (TUBING)
TUBE CONNECTING 20X1/4 (TUBING) IMPLANT
YANKAUER SUCT BULB TIP NO VENT (SUCTIONS) IMPLANT

## 2018-03-24 NOTE — Transfer of Care (Signed)
Immediate Anesthesia Transfer of Care Note  Patient: Kim Montgomery  Procedure(s) Performed: RIGHT BREAST LUMPECTOMY WITH RADIOACTIVE SEED AND RIGHT AXILLARY SENTINEL LYMPH NODE BIOPSY (Right Breast)  Patient Location: PACU  Anesthesia Type:GA combined with regional for post-op pain  Level of Consciousness: awake, alert , oriented and patient cooperative  Airway & Oxygen Therapy: Patient Spontanous Breathing and Patient connected to face mask oxygen  Post-op Assessment: Report given to RN and Post -op Vital signs reviewed and stable  Post vital signs: Reviewed and stable  Last Vitals:  Vitals Value Taken Time  BP    Temp    Pulse 73 03/24/2018  3:15 PM  Resp    SpO2 100 % 03/24/2018  3:15 PM  Vitals shown include unvalidated device data.  Last Pain:  Vitals:   03/24/18 1226  TempSrc: Oral  PainSc: 0-No pain         Complications: No apparent anesthesia complications

## 2018-03-24 NOTE — Interval H&P Note (Signed)
History and Physical Interval Note:  03/24/2018 2:04 PM discused with Dr Lindi Adie today, will hold on port and await final tumor size.  Kim Montgomery  has presented today for surgery, with the diagnosis of right breast cancer.  The various methods of treatment have been discussed with the patient and family. After consideration of risks, benefits and other options for treatment, the patient has consented to  Procedure(s): RIGHT BREAST LUMPECTOMY WITH RADIOACTIVE SEED AND RIGHT AXILLARY SENTINEL LYMPH NODE BIOPSY (Right) INSERTION PORT-A-CATH WITH ULTRASOUND (N/A) as a surgical intervention.  The patient's history has been reviewed, patient examined, no change in status, stable for surgery.  I have reviewed the patient's chart and labs.  Questions were answered to the patient's satisfaction.     Rolm Bookbinder

## 2018-03-24 NOTE — Progress Notes (Signed)
Assisted Dr. Houser with right, ultrasound guided, pectoralis block. Side rails up, monitors on throughout procedure. See vital signs in flow sheet. Tolerated Procedure well. 

## 2018-03-24 NOTE — Anesthesia Procedure Notes (Addendum)
Anesthesia Regional Block: Pectoralis block   Pre-Anesthetic Checklist: ,, timeout performed, Correct Patient, Correct Site, Correct Laterality, Correct Procedure, Correct Position, site marked, Risks and benefits discussed, pre-op evaluation,  At surgeon's request and post-op pain management  Laterality: Right  Prep: Maximum Sterile Barrier Precautions used, chloraprep       Needles:  Injection technique: Single-shot  Needle Type: Echogenic Needle     Needle Length: 9cm  Needle Gauge: 21     Additional Needles:   Procedures:,,,, ultrasound used (permanent image in chart),,,,  Narrative:  Start time: 03/24/2018 12:49 PM End time: 03/24/2018 12:58 PM Injection made incrementally with aspirations every 5 mL.  Performed by: Personally  Anesthesiologist: Barnet Glasgow, MD  Additional Notes: Block assessed. Patient tolerated procedure well.

## 2018-03-24 NOTE — Discharge Instructions (Signed)
NO TYLENOL BEFORE 6:30 PM today!   NO IBUPROFEN BEFORE 6:30PM TODAY!        [facility] Office Phone Number [phone]  POST OP INSTRUCTIONS Take 400 mg of ibuprofen every 8 hours or 650 mg tylenol every 6 hours for next 72 hours then as needed. Use ice several times daily also. Always review your discharge instruction sheet given to you by the facility where your surgery was performed.  IF YOU HAVE DISABILITY OR FAMILY LEAVE FORMS, YOU MUST BRING THEM TO THE OFFICE FOR PROCESSING.  DO NOT GIVE THEM TO YOUR DOCTOR.  1. A prescription for pain medication may be given to you upon discharge.  Take your pain medication as prescribed, if needed.  If narcotic pain medicine is not needed, then you may take acetaminophen (Tylenol), naprosyn (Alleve) or ibuprofen (Advil) as needed. 2. Take your usually prescribed medications unless otherwise directed 3. If you need a refill on your pain medication, please contact your pharmacy.  They will contact our office to request authorization.  Prescriptions will not be filled after 5pm or on week-ends. 4. You should eat very light the first 24 hours after surgery, such as soup, crackers, pudding, etc.  Resume your normal diet the day after surgery. 5. Most patients will experience some swelling and bruising in the breast.  Ice packs and a good support bra will help.  Wear the breast binder provided or a sports bra for 72 hours day and night.  After that wear a sports bra during the day until you return to the office. Swelling and bruising can take several days to resolve.  6. It is common to experience some constipation if taking pain medication after surgery.  Increasing fluid intake and taking a stool softener will usually help or prevent this problem from occurring.  A mild laxative (Milk of Magnesia or Miralax) should be taken according to package directions if there are no bowel movements after 48 hours. 7. Unless discharge instructions  indicate otherwise, you may remove your bandages 48 hours after surgery and you may shower at that time.  You may have steri-strips (small skin tapes) in place directly over the incision.  These strips should be left on the skin for 7-10 days and will come off on their own.  If your surgeon used skin glue on the incision, you may shower in 24 hours.  The glue will flake off over the next 2-3 weeks.  Any sutures or staples will be removed at the office during your follow-up visit. 8. ACTIVITIES:  You may resume regular daily activities (gradually increasing) beginning the next day.  Wearing a good support bra or sports bra minimizes pain and swelling.  You may have sexual intercourse when it is comfortable. a. You may drive when you no longer are taking prescription pain medication, you can comfortably wear a seatbelt, and you can safely maneuver your car and apply brakes. b. RETURN TO WORK:  ______________________________________________________________________________________ 9. You should see your doctor in the office for a follow-up appointment approximately two weeks after your surgery.  Your doctors nurse will typically make your follow-up appointment when she calls you with your pathology report.  Expect your pathology report 3-4 business days after your surgery.  You may call to check if you do not hear from [place] after three days. 10. OTHER INSTRUCTIONS: _______________________________________________________________________________________________ _____________________________________________________________________________________________________________________________________ _____________________________________________________________________________________________________________________________________ _____________________________________________________________________________________________________________________________________  WHEN TO CALL DR WAKEFIELD: 1. Fever over 101.0 2. Nausea  and/or vomiting. 3. Extreme swelling or bruising. 4.  Continued bleeding from incision. 5. Increased pain, redness, or drainage from the incision.  The clinic staff is available to answer your questions during regular business hours.  Please dont hesitate to call and ask to speak to one of the nurses for clinical concerns.  If you have a medical emergency, go to the nearest emergency room or call 911.  A surgeon from [facility] is always on call at the hospital.  For further questions, please visit [email or web address]      Post Anesthesia Home Care Instructions  Activity: Get plenty of rest for the remainder of the day. A responsible individual must stay with you for 24 hours following the procedure.  For the next 24 hours, DO NOT: -Drive a car -Paediatric nurse -Drink alcoholic beverages -Take any medication unless instructed by your physician -Make any legal decisions or sign important papers.  Meals: Start with liquid foods such as gelatin or soup. Progress to regular foods as tolerated. Avoid greasy, spicy, heavy foods. If nausea and/or vomiting occur, drink only clear liquids until the nausea and/or vomiting subsides. Call your physician if vomiting continues.  Special Instructions/Symptoms: Your throat may feel dry or sore from the anesthesia or the breathing tube placed in your throat during surgery. If this causes discomfort, gargle with warm salt water. The discomfort should disappear within 24 hours.  If you had a scopolamine patch placed behind your ear for the management of post- operative nausea and/or vomiting:  1. The medication in the patch is effective for 72 hours, after which it should be removed.  Wrap patch in a tissue and discard in the trash. Wash hands thoroughly with soap and water. 2. You may remove the patch earlier than 72 hours if you experience unpleasant side effects which may include dry mouth, dizziness or visual  disturbances. 3. Avoid touching the patch. Wash your hands with soap and water after contact with the patch.           Information for Discharge Teaching: EXPAREL (bupivacaine liposome injectable suspension)   Your surgeon or anesthesiologist gave you EXPAREL(bupivacaine) to help control your pain after surgery.   EXPAREL is a local anesthetic that provides pain relief by numbing the tissue around the surgical site.  EXPAREL is designed to release pain medication over time and can control pain for up to 72 hours.  Depending on how you respond to EXPAREL, you may require less pain medication during your recovery.  Possible side effects:  Temporary loss of sensation or ability to move in the area where bupivacaine was injected.  Nausea, vomiting, constipation  Rarely, numbness and tingling in your mouth or lips, lightheadedness, or anxiety may occur.  Call your doctor right away if you think you may be experiencing any of these sensations, or if you have other questions regarding possible side effects.  Follow all other discharge instructions given to you by your surgeon or nurse. Eat a healthy diet and drink plenty of water or other fluids.  If you return to the hospital for any reason within 96 hours following the administration of EXPAREL, it is important for health care providers to know that you have received this anesthetic. A teal colored band has been placed on your arm with the date, time and amount of EXPAREL you have received in order to alert and inform your health care providers. Please leave this armband in place for the full 96 hours following administration, and then you may remove the band.

## 2018-03-24 NOTE — Anesthesia Procedure Notes (Signed)
Procedure Name: LMA Insertion Date/Time: 03/24/2018 2:14 PM Performed by: Signe Colt, CRNA Pre-anesthesia Checklist: Patient identified, Emergency Drugs available, Suction available and Patient being monitored Patient Re-evaluated:Patient Re-evaluated prior to induction Oxygen Delivery Method: Circle system utilized Preoxygenation: Pre-oxygenation with 100% oxygen Induction Type: IV induction Ventilation: Mask ventilation without difficulty LMA: LMA inserted LMA Size: 4.0 Number of attempts: 1 Airway Equipment and Method: Bite block Placement Confirmation: positive ETCO2 Tube secured with: Tape Dental Injury: Teeth and Oropharynx as per pre-operative assessment

## 2018-03-24 NOTE — Interval H&P Note (Signed)
History and Physical Interval Note:  03/24/2018 1:45 PM  Kim Montgomery  has presented today for surgery, with the diagnosis of right breast cancer.  The various methods of treatment have been discussed with the patient and family. After consideration of risks, benefits and other options for treatment, the patient has consented to  Procedure(s): RIGHT BREAST LUMPECTOMY WITH RADIOACTIVE SEED AND RIGHT AXILLARY SENTINEL LYMPH NODE BIOPSY (Right) INSERTION PORT-A-CATH WITH ULTRASOUND (N/A) as a surgical intervention.  The patient's history has been reviewed, patient examined, no change in status, stable for surgery.  I have reviewed the patient's chart and labs.  Questions were answered to the patient's satisfaction.     Rolm Bookbinder

## 2018-03-24 NOTE — H&P (Signed)
58 yof referred by Dr Marvel Plan for new right breast cancer. she has fh in her sister who had dcis at age 60 but no other. she has no mass or dc. she underwent screening mm that shows c density breasts. she has right breast calcifications. US showed an 21mm mass with negative axillary [place]. biopsy was done with clip placement. core biopsy was grade 2 idc with hg dcis. this was er pos, pr neg, her 2 positive and Ki is 30%. she is here to discuss options   Past Surgical History Illene Regulus, CMA; 03/17/2018 10:43 AM) Breast Biopsy  Right. Colon Polyp Removal - Colonoscopy  Foot Surgery  Right.  Diagnostic Studies History Illene Regulus, CMA; 03/17/2018 10:43 AM) Colonoscopy  1-5 years ago Mammogram  within last year Pap Smear  1-5 years ago  Allergies Illene Regulus, CMA; 03/17/2018 10:43 AM) No Known Drug Allergies [03/17/2018]:  Medication History (Alisha Spillers, CMA; 03/17/2018 10:44 AM) Atenolol-Chlorthalidone (100-25MG  Tablet, Oral) Active. Klor-Con M20 [facility] Tablet ER, Oral) Active. PriLOSEC OTC (20MG  Tablet DR, Oral) Active. Medications Reconciled  Social History Illene Regulus, CMA; 03/17/2018 10:43 AM) Alcohol use  Moderate alcohol use. No drug use  Tobacco use  Former smoker.  Family History Illene Regulus, [place]; 03/17/2018 10:43 AM) Cancer  Brother, Father, Sister. Diabetes Mellitus  Father. Heart Disease  Father. Hypertension  Brother, Father, Mother, Sister. Migraine Headache  Mother.  Pregnancy / Birth History Illene Regulus, CMA; 03/17/2018 10:43 AM) Age at menarche  45 years. Age of menopause  45-50 Contraceptive History  Oral contraceptives. Gravida  0 Para  0  Other Problems (Alisha Spillers, CMA; 03/17/2018 10:43 AM) Breast Cancer  Gastroesophageal Reflux Disease  High blood pressure  Lump In Breast     Review of Systems (Alisha Spillers CMA; 03/17/2018 10:43 AM) General Not Present- Appetite Loss, Chills,  Fatigue, Fever, Night Sweats, Weight Gain and Weight Loss. Skin Not Present- Change in Wart/Mole, Dryness, Hives, Jaundice, New Lesions, Non-Healing Wounds, Rash and Ulcer. HEENT Not Present- Earache, Hearing Loss, Hoarseness, Nose Bleed, Oral Ulcers, Ringing in the Ears, Seasonal Allergies, Sinus Pain, Sore Throat, Visual Disturbances, Wears glasses/contact lenses and Yellow Eyes. Respiratory Not Present- Bloody sputum, Chronic Cough, Difficulty Breathing, Snoring and Wheezing. Breast Present- Breast Mass. Not Present- Breast Pain, Nipple Discharge and Skin Changes. Cardiovascular Not Present- Chest Pain, Difficulty Breathing Lying Down, Leg Cramps, Palpitations, Rapid Heart Rate, Shortness of Breath and Swelling of Extremities. Gastrointestinal Not Present- Abdominal Pain, Bloating, Bloody Stool, Change in Bowel Habits, Chronic diarrhea, Constipation, Difficulty Swallowing, Excessive gas, Gets full quickly at meals, Hemorrhoids, Indigestion, Nausea, Rectal Pain and Vomiting. Female Genitourinary Not Present- Frequency, Nocturia, Painful Urination, Pelvic Pain and Urgency. Musculoskeletal Not Present- Back Pain, Joint Pain, Joint Stiffness, Muscle Pain, Muscle Weakness and Swelling of Extremities. Neurological Not Present- Decreased Memory, Fainting, Headaches, Numbness, Seizures, Tingling, Tremor, Trouble walking and Weakness. Psychiatric Not Present- Anxiety, Bipolar, Change in Sleep Pattern, Depression, Fearful and Frequent crying. Endocrine Not Present- Cold Intolerance, Excessive Hunger, Hair Changes, Heat Intolerance, Hot flashes and New Diabetes. Hematology Not Present- Blood Thinners, Easy Bruising, Excessive bleeding, Gland problems, HIV and Persistent Infections.  Vitals (Alisha Spillers CMA; 03/17/2018 10:43 AM) 03/17/2018 10:43 AM Weight: 157 lb Height: 61in Body Surface Area: 1.7 m Body Mass Index: 29.66 kg/m  Pulse: 71 (Regular)  BP: 110/64 (Sitting, Left Arm,  Standard)       Physical Exam Rolm Bookbinder MD; 03/17/2018 12:23 PM) General Mental Status-Alert.  Head and Neck Trachea-midline. Thyroid Gland Characteristics -  normal size and consistency.  Eye Sclera/Conjunctiva - Bilateral-No scleral icterus.  Chest and Lung Exam Chest and lung exam reveals -quiet, even and easy respiratory effort with no use of accessory muscles and on auscultation, normal breath sounds, no adventitious sounds and normal vocal resonance.  Breast Nipples-No Discharge. Breast Lump-No Palpable Breast Mass.  Cardiovascular Cardiovascular examination reveals -normal heart sounds, regular rate and rhythm with no murmurs.  Abdomen Note: soft nt/ no hepatomegaly   Neurologic Neurologic evaluation reveals -alert and oriented x 3 with no impairment of recent or remote memory.  Lymphatic Head & Neck  General Head & Neck Lymphatics: Bilateral - Description - Normal. Axillary  General Axillary Region: Bilateral - Description - Normal. Note: no [place] adenopathy     Assessment & Plan Rolm Bookbinder MD; 03/17/2018 12:25 PM) STAGE I CARCINOMA OF BREAST, RIGHT (C50.911) Story: Right breast seed guided lumpectomy, right ax sn biopsy, port, needs med onc prior surgery We discussed the staging and pathophysiology of breast cancer. We discussed all of the different options for treatment for breast cancer including surgery, chemotherapy, radiation therapy, Herceptin, and antiestrogen therapy. We discussed a sentinel lymph node biopsy as she does not appear to having lymph node involvement right now. We discussed the performance of that with injection of radioactive tracer. We discussed that there is a chance of having a positive node with a sentinel lymph node biopsy and we will await the permanent pathology to make any other first further decisions in terms of her treatment. We discussed up to a 5% risk lifetime of chronic shoulder pain as  well as lymphedema associated with a sentinel lymph node biopsy. We discussed the options for treatment of the breast cancer which included lumpectomy versus a mastectomy. We discussed the performance of the lumpectomy with radioactive seed placement. We discussed a 5% chance of a positive margin requiring reexcision in the operating room. We also discussed that she will need radiation therapy if she undergoes lumpectomy. We discussed mastectomy Mastectomy can be followed by reconstruction. The decision for lumpectomy vs mastectomy has no impact on decision for chemotherapy. Most mastectomy patients will not need radiation therapy. We discussed that there is no difference in her survival whether she undergoes lumpectomy with radiation therapy or antiestrogen therapy versus a mastectomy. There is also no real difference between her recurrence in the breast. we also discussed port placement We discussed the risks of operation including bleeding, infection, possible reoperation. She understands her further therapy will be based on what her stages at the time of her operation.

## 2018-03-24 NOTE — Anesthesia Postprocedure Evaluation (Signed)
Anesthesia Post Note  Patient: Kim Montgomery  Procedure(s) Performed: RIGHT BREAST LUMPECTOMY WITH RADIOACTIVE SEED AND RIGHT AXILLARY SENTINEL LYMPH NODE BIOPSY (Right Breast)     Patient location during evaluation: PACU Anesthesia Type: General Level of consciousness: awake and alert Pain management: pain level controlled Vital Signs Assessment: post-procedure vital signs reviewed and stable Respiratory status: spontaneous breathing, nonlabored ventilation, respiratory function stable and patient connected to nasal cannula oxygen Cardiovascular status: blood pressure returned to baseline and stable Postop Assessment: no apparent nausea or vomiting Anesthetic complications: no    Last Vitals:  Vitals:   03/24/18 1522 03/24/18 1530  BP:  (!) 118/58  Pulse: 85 85  Resp: 13 15  Temp: 36.5 C   SpO2: 100% 100%    Last Pain:  Vitals:   03/24/18 1530  TempSrc:   PainSc: 0-No pain                 Lidia Collum

## 2018-03-24 NOTE — Op Note (Signed)
Preoperative diagnosis: Stage I right breast cancer, triple positive Postoperative diagnosis: Same as above Procedure: 1.  Right breast seed guided lumpectomy 2.  Right deep axillary sentinel lymph node biopsy Surgeon: Dr. Serita Grammes Anesthesia: General with pectoral block Estimated blood loss: 20 cc Complications: None Drains: None Specimens: 1.  Right breast tissue containing the clip marked with paint 2.  Additional anterior margin marked short superior, long lateral, double deep 3.  Right deep axillary sentinel lymph nodes with highest count of 339 Sponge and count was correct at completion Disposition to recovery stable condition  Indications: This is a 58 year old female with a triple positive right breast cancer.  We have discussed options and elected to proceed with a seed guided lumpectomy and sentinel node biopsy.  After discussion with oncology were going to hold off on a port until we get the final pathology with the final size of the tumor.  Procedure: After informed consent was obtained the patient first underwent a pectoral block.  She then was injected with technetium in the standard periareolar fashion.  She was then placed under general anesthesia.  She had SCDs in place.  Antibiotics were given.  She was prepped and draped in the standard sterile surgical fashion.  A surgical timeout was then performed.  I located the radioactive seed in the right lower outer quadrant.  I then infiltrated Marcaine in the inframammary fold.  I then made an incision to hide it later.  I used the lighted retractor system to tunnel to the lesion.  I then remove the palpable tumor as well as the surrounding tissue in that attempt to get a clear margin.  I took the seed out separately.  I confirmed removal of the seed with mammography.  I confirmed removal of the clip with a mammogram as well.  I thought the anterior margin was close and I remove this up to the skin.  This was marked with  suture.  I then obtained hemostasis.  I placed clips in the cavity.  I then sewed this together with 2-0 Vicryl, 3-0 Vicryl, and 4-0 Monocryl.  Glue and Steri-Strips were applied.  I then located the sentinel nodes in the low axilla.  I infiltrated Marcaine and made a curvilinear incision below the axillary hairline.  I dissected through the axillary fascia.  I found several sentinel lymph nodes that were radioactive but not enlarged.  I remove these and passed them off the table.  I then did not notice any additional radioactivity in the axilla.  Hemostasis was obtained.  I closed this with 2-0 Vicryl, 3-0 Vicryl, and 4-0 Monocryl.  Glue and Steri-Strips were applied.  She tolerated this well was extubated and transferred to recovery stable.

## 2018-03-25 ENCOUNTER — Encounter (HOSPITAL_BASED_OUTPATIENT_CLINIC_OR_DEPARTMENT_OTHER): Payer: Self-pay | Admitting: General Surgery

## 2018-03-30 NOTE — Assessment & Plan Note (Signed)
03/06/2018:Screening mammogram detected right breast calcifications, 8 mm mass detected by ultrasound, axilla negative, biopsy revealed grade 2 IDC with high-grade DCIS, ER 100%, PR 1%, Ki-67 30%, HER-2 +3+ by IHC, T1BN0 stage Ia  3.23.20: Rt Lumpectomy Grade 2 IDC 0.6 cm, 0/4 LN Neg, Er 100%, PR 1%, Her 2: 3+ pos, T1bN0 Stage 1A  Pathology counseling: I discussed the final pathology report of the patient provided  a copy of this report. I discussed the margins as well as lymph node surgeries. We also discussed the final staging along with previously performed ER/PR and HER-2/neu testing.  Plan: 1. Adj Chemo with Taxol-Herceptin weekly X 12 followed by Herceptin q 3 weeks for a year. 2. Adj RT 3. Foll by Adj Anti estrogen therapy.  Chemo Counseling: Discussed the risks and benefits of chemo including hair loss, cytopenias, LFT changes, cardiac dysfunction due to herceptin  RTC to start chemo

## 2018-03-31 ENCOUNTER — Inpatient Hospital Stay (HOSPITAL_BASED_OUTPATIENT_CLINIC_OR_DEPARTMENT_OTHER): Payer: Managed Care, Other (non HMO) | Admitting: Hematology and Oncology

## 2018-03-31 ENCOUNTER — Telehealth: Payer: Self-pay | Admitting: *Deleted

## 2018-03-31 ENCOUNTER — Other Ambulatory Visit: Payer: Self-pay | Admitting: *Deleted

## 2018-03-31 DIAGNOSIS — Z170 Estrogen receptor positive status [ER+]: Secondary | ICD-10-CM | POA: Diagnosis not present

## 2018-03-31 DIAGNOSIS — C50511 Malignant neoplasm of lower-outer quadrant of right female breast: Secondary | ICD-10-CM

## 2018-03-31 MED ORDER — ONDANSETRON HCL 8 MG PO TABS: 8.0000 mg | ORAL_TABLET | Freq: Two times a day (BID) | ORAL | 1 refills | Status: DC | PRN

## 2018-03-31 MED ORDER — PROCHLORPERAZINE MALEATE 10 MG PO TABS: 10.0000 mg | ORAL_TABLET | Freq: Four times a day (QID) | ORAL | 1 refills | Status: DC | PRN

## 2018-03-31 MED ORDER — LIDOCAINE-PRILOCAINE 2.5-2.5 % EX CREA: TOPICAL_CREAM | CUTANEOUS | 3 refills | Status: DC

## 2018-03-31 NOTE — Progress Notes (Signed)
START ON PATHWAY REGIMEN - Breast   Paclitaxel Weekly + Trastuzumab Weekly:   Administer weekly:     Paclitaxel      Trastuzumab-xxxx      Trastuzumab-xxxx   **Always confirm dose/schedule in your pharmacy ordering system**  Trastuzumab (Maintenance - NO Loading Dose):   A cycle is every 21 days:     Trastuzumab-xxxx   **Always confirm dose/schedule in your pharmacy ordering system**  Patient Characteristics: Postoperative without Neoadjuvant Therapy (Pathologic Staging), Invasive Disease, Adjuvant Therapy, HER2 Positive, ER Positive, Node Negative, pT1b, pN0/N46m, Chemotherapy Indicated Therapeutic Status: Postoperative without Neoadjuvant Therapy (Pathologic Staging) AJCC Grade: G2 AJCC N Category: pN0 AJCC M Category: cM0 ER Status: Positive (+) AJCC 8 Stage Grouping: IA HER2 Status: Positive (+) Oncotype Dx Recurrence Score: Not Appropriate AJCC T Category: pT1b PR Status: Negative (-) Intervention Indicated: Chemotherapy Intent of Therapy: Curative Intent, Discussed with Patient

## 2018-03-31 NOTE — Progress Notes (Signed)
HEMATOLOGY-ONCOLOGY TELEPHONE VISIT PROGRESS NOTE  I connected with @PTNAME@ on 03/31/18 at 10:45 AM EDT by telephone and verified that I am speaking with the correct person using two identifiers.  I discussed the limitations, risks, security and privacy concerns of performing an evaluation and management service by telephone and the availability of in person appointments.  I also discussed with the patient that there may be a patient responsible charge related to this service. The patient expressed understanding and agreed to proceed.   History of Present Illness: To discuss chemo plan . 1 Week post op    Malignant neoplasm of lower-outer quadrant of right breast of female, estrogen receptor positive (HCC)   03/06/2018 Initial Diagnosis    Screening mammogram detected right breast calcifications, 8 mm mass detected by ultrasound, axilla negative, biopsy revealed grade 2 IDC with high-grade DCIS, ER 100%, PR 1%, Ki-67 30%, HER-2 +3+ by IHC, T1BN0 stage Ia    03/19/2018 Cancer Staging    Staging form: Breast, AJCC 8th Edition - Clinical stage from 03/19/2018: Stage IA (cT1b, cN0, cM0, G2, ER+, PR+, HER2+) - Signed by Gudena, Vinay, MD on 03/19/2018     Observations/Objective: Patient is recovering well from surgery. Bruising from surgery. Not much pain    Assessment Plan:  Malignant neoplasm of lower-outer quadrant of right breast of female, estrogen receptor positive (HCC) 03/06/2018:Screening mammogram detected right breast calcifications, 8 mm mass detected by ultrasound, axilla negative, biopsy revealed grade 2 IDC with high-grade DCIS, ER 100%, PR 1%, Ki-67 30%, HER-2 +3+ by IHC, T1BN0 stage Ia  3.23.20: Rt Lumpectomy Grade 2 IDC 0.6 cm, 0/4 LN Neg, Er 100%, PR 1%, Her 2: 3+ pos, T1bN0 Stage 1A  Pathology counseling: I discussed the final pathology report of the patient provided  a copy of this report. I discussed the margins as well as lymph node surgeries. We also discussed the final  staging along with previously performed ER/PR and HER-2/neu testing.  Plan: 1. Adj Chemo with Taxol-Herceptin weekly X 12 followed by Herceptin q 3 weeks for a year. 2. Adj RT 3. Foll by Adj Anti estrogen therapy.  Chemo Counseling: Discussed the risks and benefits of chemo including hair loss, cytopenias, LFT changes, cardiac dysfunction due to herceptin  RTC to start chemo end of April. I sent a message to Dr.Wakefield for port    I discussed the assessment and treatment plan with the patient. The patient was provided an opportunity to ask questions and all were answered. The patient agreed with the plan and demonstrated an understanding of the instructions. The patient was advised to call back or seek an in-person evaluation if the symptoms worsen or if the condition fails to improve as anticipated.   I provided 20 minutes of non-face-to-face time during this encounter. Viinay K Gudena, MD   

## 2018-03-31 NOTE — Telephone Encounter (Signed)
Left vm for pt informing she will receive a call from Dr. Cristal Generous office regarding port placement prior to surgery. Contact information provided for questions or concerns.

## 2018-04-02 ENCOUNTER — Other Ambulatory Visit: Payer: Self-pay | Admitting: General Surgery

## 2018-04-04 ENCOUNTER — Telehealth: Payer: Self-pay | Admitting: Hematology and Oncology

## 2018-04-04 NOTE — Telephone Encounter (Signed)
Scheduled appts per 4/2 sch msg. Called patient, no answer, left msg. Will mail printout. Left callback number if changes need to be made.

## 2018-04-10 ENCOUNTER — Telehealth: Payer: Self-pay | Admitting: Hematology and Oncology

## 2018-04-10 ENCOUNTER — Telehealth: Payer: Self-pay | Admitting: *Deleted

## 2018-04-10 NOTE — Telephone Encounter (Signed)
Left vm informing pt that chemo start date will change d/t concerns of covid. Informed scheduling will call with new date and time. Contact information provided for questions or needs.

## 2018-04-10 NOTE — Telephone Encounter (Signed)
Rescheduled and changed appts per 4/9 sch msg. Called to inform patient of changes. No answer. Left message of cancelled appts and new dates. Left call back number

## 2018-04-18 ENCOUNTER — Telehealth: Payer: Self-pay | Admitting: Hematology and Oncology

## 2018-04-18 NOTE — Telephone Encounter (Signed)
R/s appt per 4/16 sch message- pt is aware of appt date and time

## 2018-04-21 ENCOUNTER — Other Ambulatory Visit (HOSPITAL_COMMUNITY): Payer: Managed Care, Other (non HMO)

## 2018-04-21 ENCOUNTER — Other Ambulatory Visit: Payer: Managed Care, Other (non HMO)

## 2018-04-29 ENCOUNTER — Ambulatory Visit: Payer: Managed Care, Other (non HMO) | Admitting: Hematology and Oncology

## 2018-04-29 ENCOUNTER — Ambulatory Visit: Payer: Managed Care, Other (non HMO)

## 2018-04-29 ENCOUNTER — Other Ambulatory Visit: Payer: Managed Care, Other (non HMO)

## 2018-05-05 ENCOUNTER — Inpatient Hospital Stay: Payer: Managed Care, Other (non HMO) | Attending: Hematology and Oncology

## 2018-05-05 ENCOUNTER — Other Ambulatory Visit: Payer: Self-pay

## 2018-05-05 ENCOUNTER — Telehealth: Payer: Self-pay | Admitting: *Deleted

## 2018-05-05 ENCOUNTER — Ambulatory Visit (HOSPITAL_COMMUNITY)
Admission: RE | Admit: 2018-05-05 | Discharge: 2018-05-05 | Disposition: A | Discharge status: Home / Self Care | Payer: Managed Care, Other (non HMO) | Source: Ambulatory Visit | Attending: Hematology and Oncology | Admitting: Hematology and Oncology

## 2018-05-05 DIAGNOSIS — Z170 Estrogen receptor positive status [ER+]: Secondary | ICD-10-CM

## 2018-05-05 DIAGNOSIS — C50511 Malignant neoplasm of lower-outer quadrant of right female breast: Secondary | ICD-10-CM | POA: Insufficient documentation

## 2018-05-05 DIAGNOSIS — Z5111 Encounter for antineoplastic chemotherapy: Secondary | ICD-10-CM | POA: Insufficient documentation

## 2018-05-05 DIAGNOSIS — Z5112 Encounter for antineoplastic immunotherapy: Secondary | ICD-10-CM | POA: Insufficient documentation

## 2018-05-05 DIAGNOSIS — K1231 Oral mucositis (ulcerative) due to antineoplastic therapy: Secondary | ICD-10-CM | POA: Insufficient documentation

## 2018-05-05 DIAGNOSIS — Z79899 Other long term (current) drug therapy: Secondary | ICD-10-CM | POA: Insufficient documentation

## 2018-05-05 DIAGNOSIS — I10 Essential (primary) hypertension: Secondary | ICD-10-CM | POA: Insufficient documentation

## 2018-05-05 DIAGNOSIS — R51 Headache: Secondary | ICD-10-CM | POA: Insufficient documentation

## 2018-05-05 DIAGNOSIS — G4700 Insomnia, unspecified: Secondary | ICD-10-CM | POA: Insufficient documentation

## 2018-05-05 DIAGNOSIS — Z87891 Personal history of nicotine dependence: Secondary | ICD-10-CM | POA: Diagnosis not present

## 2018-05-05 NOTE — Progress Notes (Signed)
  Echocardiogram 2D Echocardiogram has been performed.  [name] 05/05/2018, 12:00 PM

## 2018-05-06 ENCOUNTER — Other Ambulatory Visit: Payer: Managed Care, Other (non HMO)

## 2018-05-06 ENCOUNTER — Ambulatory Visit: Payer: Managed Care, Other (non HMO)

## 2018-05-06 ENCOUNTER — Ambulatory Visit: Payer: Managed Care, Other (non HMO) | Admitting: Hematology and Oncology

## 2018-05-07 ENCOUNTER — Other Ambulatory Visit: Payer: Self-pay

## 2018-05-07 ENCOUNTER — Encounter (HOSPITAL_BASED_OUTPATIENT_CLINIC_OR_DEPARTMENT_OTHER): Payer: Self-pay | Admitting: *Deleted

## 2018-05-08 ENCOUNTER — Other Ambulatory Visit: Payer: Self-pay | Admitting: General Surgery

## 2018-05-09 ENCOUNTER — Encounter (HOSPITAL_BASED_OUTPATIENT_CLINIC_OR_DEPARTMENT_OTHER)
Admission: RE | Admit: 2018-05-09 | Discharge: 2018-05-09 | Disposition: A | Discharge status: Home / Self Care | Payer: Managed Care, Other (non HMO) | Source: Ambulatory Visit | Attending: General Surgery | Admitting: General Surgery

## 2018-05-09 ENCOUNTER — Other Ambulatory Visit (HOSPITAL_COMMUNITY)
Admission: RE | Admit: 2018-05-09 | Discharge: 2018-05-09 | Disposition: A | Discharge status: Home / Self Care | Payer: Managed Care, Other (non HMO) | Source: Ambulatory Visit | Attending: General Surgery | Admitting: General Surgery

## 2018-05-09 ENCOUNTER — Other Ambulatory Visit: Payer: Self-pay

## 2018-05-09 DIAGNOSIS — Z01812 Encounter for preprocedural laboratory examination: Secondary | ICD-10-CM | POA: Insufficient documentation

## 2018-05-09 DIAGNOSIS — Z1159 Encounter for screening for other viral diseases: Secondary | ICD-10-CM | POA: Insufficient documentation

## 2018-05-09 LAB — BASIC METABOLIC PANEL
Anion gap: 10 (ref 5–15)
BUN: 13 mg/dL (ref 6–20)
CO2: 26 mmol/L (ref 22–32)
Calcium: 9.6 mg/dL (ref 8.9–10.3)
Chloride: 101 mmol/L (ref 98–111)
Creatinine, Ser: 0.76 mg/dL (ref 0.44–1.00)
GFR calc Af Amer: >60 mL/min (ref >60)
GFR calc non Af Amer: >60 mL/min (ref >60)
Glucose, Bld: 95 mg/dL (ref 70–99)
Potassium: 4.1 mmol/L (ref 3.5–5.1)
Sodium: 137 mmol/L (ref 135–145)

## 2018-05-09 NOTE — Progress Notes (Signed)
Pt given and instructed to drink Ensure day of surgery by [phone] with teach back method.

## 2018-05-11 LAB — NOVEL CORONAVIRUS, NAA (HOSP ORDER, SEND-OUT TO REF LAB; TAT 18-24 HRS): SARS-CoV-2, NAA: NOT DETECTED

## 2018-05-12 NOTE — Assessment & Plan Note (Addendum)
03/06/2018:Screening mammogram detected right breast calcifications, 8 mm mass detected by ultrasound, axilla negative, biopsy revealed grade 2 IDC with high-grade DCIS, ER 100%, PR 1%, Ki-67 30%, HER-2 +3+ by IHC, T1BN0 stage Ia  3.23.20: Rt Lumpectomy Grade 2 IDC 0.6 cm, 0/4 LN Neg, Er 100%, PR 1%, Her 2: 3+ pos, T1bN0 Stage 1A Treatment plan: 1.  Adjuvant chemo with Taxol-Herceptin weekly X 12 followed by Herceptin q 3 weeks for a year. 2.  Adjuvant radiation therapy 3. Foll by adjuvant anti estrogen therapy. --------------------------------------------------------------------------------------------------------------------------------------------------------------- Current treatment: Cycle 1 day 1 adjuvant Taxol Herceptin Labs reviewed Chemo consent obtained Chemo education completed Echocardiogram: 05/05/2018: EF 60 to 65% Return to clinic in 1 week for toxicity check and follow-up with cycle 2

## 2018-05-12 NOTE — Anesthesia Preprocedure Evaluation (Addendum)
Anesthesia Evaluation  Patient identified by MRN, date of birth, ID band Patient awake    Reviewed: Allergy & Precautions, NPO status , Patient's Chart, lab work & pertinent test results  History of Anesthesia Complications Negative for: history of anesthetic complications  Airway Mallampati: II  TM Distance: >3 FB Neck ROM: Full    Dental  (+) Dental Advisory Given, Caps   Pulmonary neg pulmonary ROS,    breath sounds clear to auscultation       Cardiovascular hypertension, Pt. on medications and Pt. on home beta blockers  Rhythm:Regular Rate:Normal   '20 TTE - WNL    Neuro/Psych negative neurological ROS  negative psych ROS   GI/Hepatic Neg liver ROS, GERD  Medicated and Controlled,  Endo/Other  negative endocrine ROS  Renal/GU negative Renal ROS     Musculoskeletal negative musculoskeletal ROS (+)   Abdominal   Peds  Hematology negative hematology ROS (+)   Anesthesia Other Findings   Reproductive/Obstetrics  Breast cancer                             Anesthesia Physical Anesthesia Plan  ASA: II  Anesthesia Plan: General   Post-op Pain Management:    Induction: Intravenous  PONV Risk Score and Plan: 3 and Treatment may vary due to age or medical condition, Ondansetron and Dexamethasone  Airway Management Planned: LMA  Additional Equipment: None  Intra-op Plan:   Post-operative Plan: Extubation in OR  Informed Consent: I have reviewed the patients History and Physical, chart, labs and discussed the procedure including the risks, benefits and alternatives for the proposed anesthesia with the patient or authorized representative who has indicated his/her understanding and acceptance.     Dental advisory given  Plan Discussed with: CRNA and Anesthesiologist  Anesthesia Plan Comments:        Anesthesia Quick Evaluation

## 2018-05-13 ENCOUNTER — Encounter (HOSPITAL_BASED_OUTPATIENT_CLINIC_OR_DEPARTMENT_OTHER): Payer: Self-pay | Admitting: *Deleted

## 2018-05-13 ENCOUNTER — Ambulatory Visit (HOSPITAL_BASED_OUTPATIENT_CLINIC_OR_DEPARTMENT_OTHER)
Admission: RE | Admit: 2018-05-13 | Discharge: 2018-05-13 | Disposition: A | Discharge status: Home / Self Care | Payer: Managed Care, Other (non HMO) | Attending: General Surgery | Admitting: General Surgery

## 2018-05-13 ENCOUNTER — Other Ambulatory Visit: Payer: Managed Care, Other (non HMO)

## 2018-05-13 ENCOUNTER — Ambulatory Visit: Payer: Managed Care, Other (non HMO)

## 2018-05-13 ENCOUNTER — Ambulatory Visit (HOSPITAL_COMMUNITY): Payer: Managed Care, Other (non HMO)

## 2018-05-13 ENCOUNTER — Ambulatory Visit: Payer: Managed Care, Other (non HMO) | Admitting: Hematology and Oncology

## 2018-05-13 ENCOUNTER — Encounter (HOSPITAL_BASED_OUTPATIENT_CLINIC_OR_DEPARTMENT_OTHER)
Admission: RE | Disposition: A | Discharge status: Home / Self Care | Payer: Self-pay | Source: Home / Self Care | Attending: General Surgery

## 2018-05-13 ENCOUNTER — Ambulatory Visit (HOSPITAL_BASED_OUTPATIENT_CLINIC_OR_DEPARTMENT_OTHER): Payer: Managed Care, Other (non HMO) | Admitting: Anesthesiology

## 2018-05-13 DIAGNOSIS — K219 Gastro-esophageal reflux disease without esophagitis: Secondary | ICD-10-CM | POA: Diagnosis not present

## 2018-05-13 DIAGNOSIS — C50911 Malignant neoplasm of unspecified site of right female breast: Secondary | ICD-10-CM | POA: Insufficient documentation

## 2018-05-13 DIAGNOSIS — Z79899 Other long term (current) drug therapy: Secondary | ICD-10-CM | POA: Diagnosis not present

## 2018-05-13 DIAGNOSIS — Z803 Family history of malignant neoplasm of breast: Secondary | ICD-10-CM | POA: Diagnosis not present

## 2018-05-13 DIAGNOSIS — I10 Essential (primary) hypertension: Secondary | ICD-10-CM | POA: Diagnosis not present

## 2018-05-13 DIAGNOSIS — Z95828 Presence of other vascular implants and grafts: Secondary | ICD-10-CM

## 2018-05-13 DIAGNOSIS — Z170 Estrogen receptor positive status [ER+]: Secondary | ICD-10-CM | POA: Insufficient documentation

## 2018-05-13 HISTORY — PX: PORTACATH PLACEMENT: SHX2246

## 2018-05-13 SURGERY — INSERTION, TUNNELED CENTRAL VENOUS DEVICE, WITH PORT
Anesthesia: General | Site: Chest

## 2018-05-13 MED ORDER — HEPARIN (PORCINE) IN NACL 1000-0.9 UT/500ML-% IV SOLN
INTRAVENOUS | Status: AC
  Filled 2018-05-13: qty 500

## 2018-05-13 MED ORDER — DEXAMETHASONE SODIUM PHOSPHATE 10 MG/ML IJ SOLN
INTRAMUSCULAR | Status: AC
  Filled 2018-05-13: qty 1

## 2018-05-13 MED ORDER — LIDOCAINE 2% (20 MG/ML) 5 ML SYRINGE
INTRAMUSCULAR | Status: AC
  Filled 2018-05-13: qty 5

## 2018-05-13 MED ORDER — PROMETHAZINE HCL 25 MG/ML IJ SOLN: 6.2500 mg | INTRAMUSCULAR | Status: DC | PRN

## 2018-05-13 MED ORDER — FENTANYL CITRATE (PF) 100 MCG/2ML IJ SOLN
INTRAMUSCULAR | Status: AC
  Filled 2018-05-13: qty 2

## 2018-05-13 MED ORDER — GABAPENTIN 100 MG PO CAPS
ORAL_CAPSULE | ORAL | Status: AC
  Filled 2018-05-13: qty 1

## 2018-05-13 MED ORDER — BUPIVACAINE HCL (PF) 0.25 % IJ SOLN
INTRAMUSCULAR | Status: AC
  Filled 2018-05-13: qty 150

## 2018-05-13 MED ORDER — MIDAZOLAM HCL 2 MG/2ML IJ SOLN
INTRAMUSCULAR | Status: AC
  Filled 2018-05-13: qty 2

## 2018-05-13 MED ORDER — CHLORHEXIDINE GLUCONATE CLOTH 2 % EX PADS: 6.0000 | MEDICATED_PAD | Freq: Once | CUTANEOUS | Status: DC

## 2018-05-13 MED ORDER — FENTANYL CITRATE (PF) 100 MCG/2ML IJ SOLN
25.0000 ug | INTRAMUSCULAR | Status: DC | PRN
  Administered 2018-05-13: 25 ug via INTRAVENOUS

## 2018-05-13 MED ORDER — OXYCODONE HCL 5 MG PO TABS: 5.0000 mg | ORAL_TABLET | Freq: Once | ORAL | Status: DC | PRN

## 2018-05-13 MED ORDER — HEPARIN (PORCINE) IN NACL 2-0.9 UNITS/ML
INTRAMUSCULAR | Status: AC | PRN
  Administered 2018-05-13: 1

## 2018-05-13 MED ORDER — METHYLENE BLUE 0.5 % INJ SOLN
INTRAVENOUS | Status: AC
  Filled 2018-05-13: qty 10

## 2018-05-13 MED ORDER — BUPIVACAINE HCL (PF) 0.25 % IJ SOLN
INTRAMUSCULAR | Status: DC | PRN
  Administered 2018-05-13: 10 mL

## 2018-05-13 MED ORDER — HEPARIN SOD (PORK) LOCK FLUSH 100 UNIT/ML IV SOLN
INTRAVENOUS | Status: DC | PRN
  Administered 2018-05-13: 500 [IU]

## 2018-05-13 MED ORDER — ONDANSETRON HCL 4 MG/2ML IJ SOLN
INTRAMUSCULAR | Status: AC
  Filled 2018-05-13: qty 2

## 2018-05-13 MED ORDER — SCOPOLAMINE 1 MG/3DAYS TD PT72: 1.0000 | MEDICATED_PATCH | Freq: Once | TRANSDERMAL | Status: DC | PRN

## 2018-05-13 MED ORDER — PHENYLEPHRINE 40 MCG/ML (10ML) SYRINGE FOR IV PUSH (FOR BLOOD PRESSURE SUPPORT)
PREFILLED_SYRINGE | INTRAVENOUS | Status: DC | PRN
  Administered 2018-05-13 (×2): 80 ug via INTRAVENOUS

## 2018-05-13 MED ORDER — MIDAZOLAM HCL 2 MG/2ML IJ SOLN
1.0000 mg | INTRAMUSCULAR | Status: DC | PRN
  Administered 2018-05-13: 2 mg via INTRAVENOUS

## 2018-05-13 MED ORDER — SODIUM CHLORIDE (PF) 0.9 % IJ SOLN
INTRAMUSCULAR | Status: AC
  Filled 2018-05-13: qty 10

## 2018-05-13 MED ORDER — FENTANYL CITRATE (PF) 100 MCG/2ML IJ SOLN
50.0000 ug | INTRAMUSCULAR | Status: DC | PRN
  Administered 2018-05-13: 50 ug via INTRAVENOUS

## 2018-05-13 MED ORDER — OXYCODONE HCL 5 MG/5ML PO SOLN: 5.0000 mg | Freq: Once | ORAL | Status: DC | PRN

## 2018-05-13 MED ORDER — CEFAZOLIN SODIUM-DEXTROSE 2-4 GM/100ML-% IV SOLN
INTRAVENOUS | Status: AC
  Filled 2018-05-13: qty 100

## 2018-05-13 MED ORDER — ONDANSETRON HCL 4 MG/2ML IJ SOLN
INTRAMUSCULAR | Status: DC | PRN
  Administered 2018-05-13: 4 mg via INTRAVENOUS

## 2018-05-13 MED ORDER — CEFAZOLIN SODIUM-DEXTROSE 2-4 GM/100ML-% IV SOLN
2.0000 g | INTRAVENOUS | Status: AC
  Administered 2018-05-13: 2 g via INTRAVENOUS

## 2018-05-13 MED ORDER — DEXAMETHASONE SODIUM PHOSPHATE 4 MG/ML IJ SOLN
INTRAMUSCULAR | Status: DC | PRN
  Administered 2018-05-13: 10 mg via INTRAVENOUS

## 2018-05-13 MED ORDER — PROPOFOL 500 MG/50ML IV EMUL
INTRAVENOUS | Status: AC
  Filled 2018-05-13: qty 50

## 2018-05-13 MED ORDER — HEPARIN SOD (PORK) LOCK FLUSH 100 UNIT/ML IV SOLN
INTRAVENOUS | Status: AC
  Filled 2018-05-13: qty 5

## 2018-05-13 MED ORDER — PROPOFOL 10 MG/ML IV BOLUS
INTRAVENOUS | Status: DC | PRN
  Administered 2018-05-13: 150 mg via INTRAVENOUS

## 2018-05-13 MED ORDER — ACETAMINOPHEN 500 MG PO TABS
1000.0000 mg | ORAL_TABLET | ORAL | Status: AC
  Administered 2018-05-13: 07:00:00 1000 mg via ORAL

## 2018-05-13 MED ORDER — LIDOCAINE 2% (20 MG/ML) 5 ML SYRINGE
INTRAMUSCULAR | Status: DC | PRN
  Administered 2018-05-13: 80 mg via INTRAVENOUS

## 2018-05-13 MED ORDER — GABAPENTIN 100 MG PO CAPS
100.0000 mg | ORAL_CAPSULE | ORAL | Status: AC
  Administered 2018-05-13: 100 mg via ORAL

## 2018-05-13 MED ORDER — LACTATED RINGERS IV SOLN
INTRAVENOUS | Status: DC
  Administered 2018-05-13: 07:00:00 via INTRAVENOUS

## 2018-05-13 MED ORDER — ACETAMINOPHEN 500 MG PO TABS
ORAL_TABLET | ORAL | Status: AC
  Filled 2018-05-13: qty 2

## 2018-05-13 SURGICAL SUPPLY — 54 items
ADH SKN CLS APL DERMABOND .7 (GAUZE/BANDAGES/DRESSINGS) ×1
APL PRP STRL LF DISP 70% ISPRP (MISCELLANEOUS) ×1
APL SKNCLS STERI-STRIP NONHPOA (GAUZE/BANDAGES/DRESSINGS) ×1
BAG DECANTER FOR FLEXI CONT (MISCELLANEOUS) ×3 IMPLANT
BENZOIN TINCTURE PRP APPL 2/3 (GAUZE/BANDAGES/DRESSINGS) ×3 IMPLANT
BLADE SURG 11 STRL SS (BLADE) ×3 IMPLANT
BLADE SURG 15 STRL LF DISP TIS (BLADE) ×1 IMPLANT
BLADE SURG 15 STRL SS (BLADE) ×3
CANISTER SUCT 1200ML W/VALVE (MISCELLANEOUS) IMPLANT
CHLORAPREP W/TINT 26 (MISCELLANEOUS) ×3 IMPLANT
CLOSURE WOUND 1/2 X4 (GAUZE/BANDAGES/DRESSINGS) ×1
COVER BACK TABLE REUSABLE LG (DRAPES) ×3 IMPLANT
COVER MAYO STAND REUSABLE (DRAPES) ×3 IMPLANT
COVER PROBE 5X48 (MISCELLANEOUS)
COVER WAND RF STERILE (DRAPES) IMPLANT
DECANTER SPIKE VIAL GLASS SM (MISCELLANEOUS) IMPLANT
DERMABOND ADVANCED (GAUZE/BANDAGES/DRESSINGS) ×2
DERMABOND ADVANCED .7 DNX12 (GAUZE/BANDAGES/DRESSINGS) ×1 IMPLANT
DRAPE C-ARM 42X72 X-RAY (DRAPES) ×3 IMPLANT
DRAPE LAPAROSCOPIC ABDOMINAL (DRAPES) ×3 IMPLANT
DRAPE UTILITY XL STRL (DRAPES) ×3 IMPLANT
DRSG TEGADERM 4X4.75 (GAUZE/BANDAGES/DRESSINGS) ×6 IMPLANT
ELECT COATED BLADE 2.86 ST (ELECTRODE) ×3 IMPLANT
ELECT REM PT RETURN 9FT ADLT (ELECTROSURGICAL) ×3
ELECTRODE REM PT RTRN 9FT ADLT (ELECTROSURGICAL) ×1 IMPLANT
GAUZE SPONGE 4X4 12PLY STRL LF (GAUZE/BANDAGES/DRESSINGS) ×3 IMPLANT
GLOVE BIO SURGEON STRL SZ7 (GLOVE) ×3 IMPLANT
GLOVE BIOGEL PI IND STRL 7.5 (GLOVE) ×1 IMPLANT
GLOVE BIOGEL PI INDICATOR 7.5 (GLOVE) ×2
GOWN STRL REUS W/ TWL LRG LVL3 (GOWN DISPOSABLE) ×2 IMPLANT
GOWN STRL REUS W/TWL LRG LVL3 (GOWN DISPOSABLE) ×6
IV KIT MINILOC 20X1 SAFETY (NEEDLE) IMPLANT
KIT CVR 48X5XPRB PLUP LF (MISCELLANEOUS) IMPLANT
KIT PORT POWER 8FR ISP CVUE (Port) ×3 IMPLANT
NDL HYPO 25X1 1.5 SAFETY (NEEDLE) ×1 IMPLANT
NDL SAFETY ECLIPSE 18X1.5 (NEEDLE) IMPLANT
NEEDLE HYPO 18GX1.5 SHARP (NEEDLE)
NEEDLE HYPO 25X1 1.5 SAFETY (NEEDLE) ×3 IMPLANT
PACK BASIN DAY SURGERY FS (CUSTOM PROCEDURE TRAY) ×3 IMPLANT
PENCIL BUTTON HOLSTER BLD 10FT (ELECTRODE) ×3 IMPLANT
SLEEVE SCD COMPRESS KNEE MED (MISCELLANEOUS) ×3 IMPLANT
STRIP CLOSURE SKIN 1/2X4 (GAUZE/BANDAGES/DRESSINGS) ×2 IMPLANT
SUT MNCRL AB 4-0 PS2 18 (SUTURE) ×3 IMPLANT
SUT PROLENE 2 0 SH DA (SUTURE) ×3 IMPLANT
SUT SILK 2 0 TIES 17X18 (SUTURE)
SUT SILK 2-0 18XBRD TIE BLK (SUTURE) IMPLANT
SUT VIC AB 3-0 SH 27 (SUTURE) ×3
SUT VIC AB 3-0 SH 27X BRD (SUTURE) ×1 IMPLANT
SYR 5ML LUER SLIP (SYRINGE) ×5 IMPLANT
SYR CONTROL 10ML LL (SYRINGE) ×3 IMPLANT
TOWEL GREEN STERILE FF (TOWEL DISPOSABLE) ×3 IMPLANT
TUBE CONNECTING 20'X1/4 (TUBING)
TUBE CONNECTING 20X1/4 (TUBING) IMPLANT
YANKAUER SUCT BULB TIP NO VENT (SUCTIONS) IMPLANT

## 2018-05-13 NOTE — Op Note (Signed)
Preoperative diagnosis stage I her 2 positive breast cancer Postoperative diagnosis: same as above Procedure: right ij US guided powerport insertion Surgeon: Dr Serita Grammes EBL: minimal Anes: general  Specimensnone Complications none Drains none Sponge count correct Dispo to pacu stable  Indications: This is a 44 yof I have done a lumpectomy/sn on. She has her 2 positive disease and final size merits antiher2 therapy. We discussed port placement.  Procedure: After informed consent was obtained the patient was taken to the operating room. She was given antibiotics. Sequential compression devices were on her legs. She was then placed under general anesthesia. Then she was prepped and draped in the standard sterile surgical fashion. Surgical timeout was then performed.  Ithenused the ultrasound to identify the right internal jugular vein. I then accessed the vein using the ultrasound.This aspirated blood. I then placed the wire. This was confirmed by fluoroscopy and ultrasound to be in the correct position.I then infiltrated marcaine and made anincision. I created a pocket.I tunneled the line between the 2 sites.I then dilated the tract and placed the dilator assembly with the sheath. This was done under fluoroscopy. I then removed the sheath and dilator. The wire was also removed. The line was then pulled back to be in the venacava. I hooked this up to the port. I sutured this into place with 2-0 Prolene in 2 places. This aspirated blood and flushed easily.This was confirmed with a final fluoroscopy. I then closed this with 2-0 Vicryl and 4-0 Monocryl.I then accessed the port and placed heparin in it. Will leave accessed for chemotherapy tomorrow. Dermabond was placed on both the incisions.dressings were placed She tolerated this well and was transferred to the recovery room in stable condition

## 2018-05-13 NOTE — Anesthesia Postprocedure Evaluation (Signed)
Anesthesia Post Note  Patient: Kim Montgomery  Procedure(s) Performed: INSERTION PORT-A-CATH WITH ULTRASOUND (N/A Chest)     Patient location during evaluation: PACU Anesthesia Type: General Level of consciousness: awake and alert Pain management: pain level controlled Vital Signs Assessment: post-procedure vital signs reviewed and stable Respiratory status: spontaneous breathing, nonlabored ventilation and respiratory function stable Cardiovascular status: blood pressure returned to baseline and stable Postop Assessment: no apparent nausea or vomiting Anesthetic complications: no    Last Vitals:  Vitals:   05/13/18 0845 05/13/18 0900  BP: 114/64 115/67  Pulse: 69 66  Resp: 12 12  Temp:    SpO2: 95% 95%    Last Pain:  Vitals:   05/13/18 0900  TempSrc:   PainSc: [place]

## 2018-05-13 NOTE — Discharge Instructions (Signed)
PORT-A-CATH: POST OP INSTRUCTIONS  Always review your discharge instruction sheet given to you by the facility where your surgery was performed.   1. A prescription for pain medication may be given to you upon discharge. Take your pain medication as prescribed, if needed. If narcotic pain medicine is not needed, then you make take acetaminophen (Tylenol) or ibuprofen (Advil) as needed.  2. Take your usually prescribed medications unless otherwise directed. 3. If you need a refill on your pain medication, please contact our office. All narcotic pain medicine now requires a paper prescription.  Phoned in and fax refills are no longer allowed by law.  Prescriptions will not be filled after 5 pm or on weekends.  4. You should follow a light diet for the remainder of the day after your procedure. 5. Most patients will experience some mild swelling and/or bruising in the area of the incision. It may take several days to resolve. 6. It is common to experience some constipation if taking pain medication after surgery. Increasing fluid intake and taking a stool softener (such as Colace) will usually help or prevent this problem from occurring. A mild laxative (Milk of Magnesia or Miralax) should be taken according to package directions if there are no bowel movements after 48 hours.  7. Unless discharge instructions indicate otherwise, you may remove your bandages 48 hours after surgery, and you may shower at that time. You may have steri-strips (small white skin tapes) in place directly over the incision.  These strips should be left on the skin for 7-10 days.  If your surgeon used Dermabond (skin glue) on the incision, you may shower in 24 hours.  The glue will flake off over the next 2-3 weeks.  8. If your port is left accessed at the end of surgery (needle left in port), the dressing cannot get wet and should only by changed by a healthcare professional. When the port is no longer accessed (when the  needle has been removed), follow step 7.   9. ACTIVITIES:  Limit activity involving your arms for the next 72 hours. Do no strenuous exercise or activity for 1 week. You may drive when you are no longer taking prescription pain medication, you can comfortably wear a seatbelt, and you can maneuver your car. 10.You may need to see your doctor in the office for a follow-up appointment.  Please       check with your doctor.  11.When you receive a new Port-a-Cath, you will get a product guide and        ID card.  Please keep them in case you need them.  WHEN TO CALL YOUR DOCTOR [phone]): 1. Fever over 101.0 2. Chills 3. Continued bleeding from incision 4. Increased redness and tenderness at the site 5. Shortness of breath, difficulty breathing   The clinic staff is available to answer your questions during regular business hours. Please dont hesitate to call and ask to speak to one of the nurses or medical assistants for clinical concerns. If you have a medical emergency, go to the nearest emergency room or call 911.  A surgeon from [facility] is always on call at the hospital.     For further information, please visit [email or web address]    NO Tylenol before 1pm today!        Post Anesthesia Home Care Instructions  Activity: Get plenty of rest for the remainder of the day. A responsible individual must stay with you for 24  hours following the procedure.  For the next 24 hours, DO NOT: -Drive a car -Paediatric nurse -Drink alcoholic beverages -Take any medication unless instructed by your physician -Make any legal decisions or sign important papers.  Meals: Start with liquid foods such as gelatin or soup. Progress to regular foods as tolerated. Avoid greasy, spicy, heavy foods. If nausea and/or vomiting occur, drink only clear liquids until the nausea and/or vomiting subsides. Call your physician if vomiting continues.  Special  Instructions/Symptoms: Your throat may feel dry or sore from the anesthesia or the breathing tube placed in your throat during surgery. If this causes discomfort, gargle with warm salt water. The discomfort should disappear within 24 hours.  If you had a scopolamine patch placed behind your ear for the management of post- operative nausea and/or vomiting:  1. The medication in the patch is effective for 72 hours, after which it should be removed.  Wrap patch in a tissue and discard in the trash. Wash hands thoroughly with soap and water. 2. You may remove the patch earlier than 72 hours if you experience unpleasant side effects which may include dry mouth, dizziness or visual disturbances. 3. Avoid touching the patch. Wash your hands with soap and water after contact with the patch.

## 2018-05-13 NOTE — Anesthesia Procedure Notes (Signed)
Procedure Name: LMA Insertion Date/Time: 05/13/2018 7:34 AM Performed by: Lieutenant Diego, CRNA Pre-anesthesia Checklist: Patient identified, Emergency Drugs available, Suction available and Patient being monitored Patient Re-evaluated:Patient Re-evaluated prior to induction Oxygen Delivery Method: Circle system utilized Preoxygenation: Pre-oxygenation with 100% oxygen Induction Type: IV induction Ventilation: Mask ventilation without difficulty LMA: LMA inserted LMA Size: 4.0 Number of attempts: 1 Airway Equipment and Method: Bite block Placement Confirmation: positive ETCO2 and breath sounds checked- equal and bilateral Tube secured with: Tape Dental Injury: Teeth and Oropharynx as per pre-operative assessment

## 2018-05-13 NOTE — H&P (Signed)
Kim Montgomery is an 58 y.o. female.   Chief Complaint: breast cancer HPI: 85 yof s/p lump/sn for breast cancer. Doing well. Now needs to start chemotherapy. Discussed port placement  Past Medical History:  Diagnosis Date  . Cancer ([place]) 03/2018   right breast cancer  . GERD (gastroesophageal reflux disease)   . Hypertension   . Insomnia   . Seasonal allergies     Past Surgical History:  Procedure Laterality Date  . BREAST LUMPECTOMY WITH RADIOACTIVE SEED AND SENTINEL LYMPH NODE BIOPSY Right 03/24/2018   Procedure: RIGHT BREAST LUMPECTOMY WITH RADIOACTIVE SEED AND RIGHT AXILLARY SENTINEL LYMPH NODE BIOPSY;  Surgeon: Rolm Bookbinder, MD;  Location: [facility];  Service: General;  Laterality: Right;  . CYST REMOVAL NECK     at age 24  . FOOT SURGERY Right     Family History  Problem Relation Age of Onset  . Breast cancer Sister        early to mid 55's   Social History:  reports that she has never smoked. She has never used smokeless tobacco. She reports current alcohol use. She reports that she does not use drugs.  Allergies: No Known Allergies  Medications Prior to Admission  Medication Sig Dispense Refill  . albuterol (PROVENTIL HFA;VENTOLIN HFA) 108 (90 Base) MCG/ACT inhaler Inhale into the lungs every 6 (six) hours as needed for wheezing or shortness of breath.    [name] atenolol-chlorthalidone (TENORETIC) 100-25 MG tablet Take 1 tablet by mouth daily.    . clonazePAM (KLONOPIN) 0.5 MG tablet Take 0.5 mg by mouth at bedtime.    . Melatonin 10 MG TABS Take by mouth.    [name] omeprazole (PRILOSEC) 20 MG capsule Take 20 mg by mouth daily.    . potassium chloride SA (K-DUR,KLOR-CON) 20 MEQ tablet Take 20 mEq by mouth 2 (two) times daily.    [name] lidocaine-prilocaine (EMLA) cream Apply to affected area once 30 g 3  . ondansetron (ZOFRAN) 8 MG tablet Take 1 tablet (8 mg total) by mouth 2 (two) times daily as needed (Nausea or vomiting). 30 tablet 1  .  prochlorperazine (COMPAZINE) 10 MG tablet Take 1 tablet (10 mg total) by mouth every 6 (six) hours as needed (Nausea or vomiting). 30 tablet 1    No results found for this or any previous visit (from the past 48 hour(s)). No results found.  ROS Negative  Blood pressure (!) 123/59, pulse 69, temperature 98.2 F (36.8 C), temperature source Oral, height 5\' 1"  (1.549 m), weight 71.4 kg, SpO2 99 %. Physical Exam  cv rrr Lungs clear Incisions healing  Assessment/Plan Breast cancer Port placement  Rolm Bookbinder, MD 05/13/2018, 7:17 AM

## 2018-05-13 NOTE — Progress Notes (Signed)
Patient Care Team: [name], MD as PCP - General (Family Medicine)  DIAGNOSIS:    ICD-10-CM   1. Malignant neoplasm of lower-outer quadrant of right breast of female, estrogen receptor positive ([place]) C50.511    Z17.0     SUMMARY OF ONCOLOGIC HISTORY:   Malignant neoplasm of lower-outer quadrant of right breast of female, estrogen receptor positive ([place])   03/06/2018 Initial Diagnosis    Screening mammogram detected right breast calcifications, 8 mm mass detected by ultrasound, axilla negative, biopsy revealed grade 2 IDC with high-grade DCIS, ER 100%, PR 1%, Ki-67 30%, HER-2 +3+ by IHC, T1BN0 stage Ia    03/19/2018 Cancer Staging    Staging form: Breast, AJCC 8th Edition - Clinical stage from 03/19/2018: Stage IA (cT1b, cN0, cM0, G2, ER+, PR+, HER2+) - Signed by Nicholas Lose, MD on 03/19/2018    05/14/2018 -  Chemotherapy    The patient had trastuzumab (HERCEPTIN) 273 mg in sodium chloride 0.9 % 250 mL chemo infusion, 4 mg/kg = 273 mg, Intravenous,  Once, 0 of 16 cycles PACLitaxel (TAXOL) 138 mg in sodium chloride 0.9 % 250 mL chemo infusion (</= 62m/m2), 80 mg/m2 = 138 mg, Intravenous,  Once, 0 of 3 cycles  for chemotherapy treatment.      CHIEF COMPLIANT: Cycle 1 Taxol Herceptin  INTERVAL HISTORY: Kim Blanchettis a 58y.o. with above-mentioned history of right breast cancer who underwent a lumpectomy and will begin adjuvant chemotherapy with weekly Taxol and Herceptin today. ECHO on 05/05/18 showed an ejection fraction in the range of 60-65%. Her port was inserted yesterday by Dr. WDonne Hazel She presents to the clinic today for cycle 1.   REVIEW OF SYSTEMS:   Constitutional: Denies fevers, chills or abnormal weight loss Eyes: Denies blurriness of vision Ears, nose, mouth, throat, and face: Denies mucositis or sore throat Respiratory: Denies cough, dyspnea or wheezes Cardiovascular: Denies palpitation, chest discomfort Gastrointestinal: Denies nausea, heartburn or  change in bowel habits Skin: Denies abnormal skin rashes Lymphatics: Denies new lymphadenopathy or easy bruising Neurological: Denies numbness, tingling or new weaknesses Behavioral/Psych: Mood is stable, no new changes  Extremities: No lower extremity edema Breast: denies any pain or lumps or nodules in either breasts All other systems were reviewed with the patient and are negative.  I have reviewed the past medical history, past surgical history, social history and family history with the patient and they are unchanged from previous note.  ALLERGIES:  has No Known Allergies.  MEDICATIONS:  Current Outpatient Medications  Medication Sig Dispense Refill  . albuterol (PROVENTIL HFA;VENTOLIN HFA) 108 (90 Base) MCG/ACT inhaler Inhale into the lungs every 6 (six) hours as needed for wheezing or shortness of breath.    .Marland Kitchenatenolol-chlorthalidone (TENORETIC) 100-25 MG tablet Take 1 tablet by mouth daily.    . clonazePAM (KLONOPIN) 0.5 MG tablet Take 0.5 mg by mouth at bedtime.    . lidocaine-prilocaine (EMLA) cream Apply to affected area once 30 g 3  . Melatonin 10 MG TABS Take by mouth.    .Marland Kitchenomeprazole (PRILOSEC) 20 MG capsule Take 20 mg by mouth daily.    . ondansetron (ZOFRAN) 8 MG tablet Take 1 tablet (8 mg total) by mouth 2 (two) times daily as needed (Nausea or vomiting). 30 tablet 1  . potassium chloride SA (K-DUR,KLOR-CON) 20 MEQ tablet Take 20 mEq by mouth 2 (two) times daily.    . prochlorperazine (COMPAZINE) 10 MG tablet Take 1 tablet (10 mg total) by mouth every 6 (  six) hours as needed (Nausea or vomiting). 30 tablet 1   No current facility-administered medications for this visit.     PHYSICAL EXAMINATION: ECOG PERFORMANCE STATUS: 0 - Asymptomatic  Vitals:   05/14/18 1130  BP: 134/68  Pulse: 63  Resp: 17  Temp: 97.8 F (36.6 C)  SpO2: 99%   Filed Weights   05/14/18 1130  Weight: 159 lb 12.8 oz (72.5 kg)    GENERAL: alert, no distress and comfortable SKIN: skin  color, texture, turgor are normal, no rashes or significant lesions EYES: normal, Conjunctiva are pink and non-injected, sclera clear OROPHARYNX: no exudate, no erythema and lips, buccal mucosa, and tongue normal  NECK: supple, thyroid normal size, non-tender, without nodularity LYMPH: no palpable lymphadenopathy in the cervical, axillary or inguinal LUNGS: clear to auscultation and percussion with normal breathing effort HEART: regular rate & rhythm and no murmurs and no lower extremity edema ABDOMEN: abdomen soft, non-tender and normal bowel sounds MUSCULOSKELETAL: no cyanosis of digits and no clubbing  NEURO: alert & oriented x 3 with fluent speech, no focal motor/sensory deficits EXTREMITIES: No lower extremity edema  LABORATORY DATA:  I have reviewed the data as listed CMP Latest Ref Rng & Units 05/09/2018 03/24/2018 03/17/2018  Glucose 70 - 99 mg/dL 95 84 125(H)  BUN 6 - 20 mg/dL _0 Creatinine 0.44 - 1.00 mg/dL 0.76 0.75 0.76  Sodium 135 - 145 mmol/L 137 137 136  Potassium 3.5 - 5.1 mmol/L 4.1 3.3(L) 2.9(L)  Chloride 98 - 111 mmol/L 101 102 101  CO2 22 - 32 mmol/L _1 Calcium 8.9 - 10.3 mg/dL 9.6 9.5 9.7    Lab Results  Component Value Date   WBC 12.3 (H) 05/14/2018   HGB 12.5 05/14/2018   HCT 37.0 05/14/2018   MCV 92.7 05/14/2018   PLT 174 05/14/2018   NEUTROABS 9.0 (H) 05/14/2018    ASSESSMENT & PLAN:  Malignant neoplasm of lower-outer quadrant of right breast of female, estrogen receptor positive ([place]) 03/06/2018:Screening mammogram detected right breast calcifications, 8 mm mass detected by ultrasound, axilla negative, biopsy revealed grade 2 IDC with high-grade DCIS, ER 100%, PR 1%, Ki-67 30%, HER-2 +3+ by IHC, T1BN0 stage Ia  3.23.20: Rt Lumpectomy Grade 2 IDC 0.6 cm, 0/4 LN Neg, Er 100%, PR 1%, Her 2: 3+ pos, T1bN0 Stage 1A Treatment plan: 1.  Adjuvant chemo with Taxol-Herceptin weekly X 12 followed by Herceptin q 3 weeks for a year. 2.  Adjuvant  radiation therapy 3. Foll by adjuvant anti estrogen therapy. --------------------------------------------------------------------------------------------------------------------------------------------------------------- Current treatment: Cycle 1 day 1 adjuvant Taxol Herceptin Labs reviewed Chemo consent obtained Chemo education completed  Patient has soreness related to the recent port placement. Echocardiogram: 05/05/2018: EF 60 to 65% Return to clinic in 1 week for toxicity check and follow-up with cycle 2  No orders of the defined types were placed in this encounter.  The patient has a good understanding of the overall plan. she agrees with it. she will call with any problems that may develop before the next visit here.  Nicholas Lose, MD 05/14/2018  Julious Oka Dorshimer am acting as scribe for Dr. Nicholas Lose.  I have reviewed the above documentation for accuracy and completeness, and I agree with the above.

## 2018-05-13 NOTE — Interval H&P Note (Signed)
History and Physical Interval Note:  05/13/2018 7:18 AM  Kim Montgomery  has presented today for surgery, with the diagnosis of BREAST CANCER.  The various methods of treatment have been discussed with the patient and family. After consideration of risks, benefits and other options for treatment, the patient has consented to  Procedure(s): INSERTION PORT-A-CATH WITH ULTRASOUND (N/A) as a surgical intervention.  The patient's history has been reviewed, patient examined, no change in status, stable for surgery.  I have reviewed the patient's chart and labs.  Questions were answered to the patient's satisfaction.     Rolm Bookbinder

## 2018-05-13 NOTE — Transfer of Care (Signed)
Immediate Anesthesia Transfer of Care Note  Patient: Sunset Joshi  Procedure(s) Performed: INSERTION PORT-A-CATH WITH ULTRASOUND (N/A Chest)  Patient Location: PACU  Anesthesia Type:General  Level of Consciousness: sedated  Airway & Oxygen Therapy: Patient Spontanous Breathing and Patient connected to nasal cannula oxygen  Post-op Assessment: Report given to RN and Post -op Vital signs reviewed and stable  Post vital signs: Reviewed and stable  Last Vitals:  Vitals Value Taken Time  BP    Temp    Pulse    Resp    SpO2      Last Pain:  Vitals:   05/13/18 0640  TempSrc: Oral  PainSc: 0-No pain         Complications: No apparent anesthesia complications

## 2018-05-14 ENCOUNTER — Ambulatory Visit: Payer: Managed Care, Other (non HMO)

## 2018-05-14 ENCOUNTER — Encounter (HOSPITAL_BASED_OUTPATIENT_CLINIC_OR_DEPARTMENT_OTHER): Payer: Self-pay | Admitting: General Surgery

## 2018-05-14 ENCOUNTER — Encounter: Payer: Self-pay | Admitting: Hematology and Oncology

## 2018-05-14 ENCOUNTER — Inpatient Hospital Stay: Payer: Managed Care, Other (non HMO)

## 2018-05-14 ENCOUNTER — Inpatient Hospital Stay (HOSPITAL_BASED_OUTPATIENT_CLINIC_OR_DEPARTMENT_OTHER): Payer: Managed Care, Other (non HMO) | Admitting: Hematology and Oncology

## 2018-05-14 ENCOUNTER — Other Ambulatory Visit: Payer: Self-pay

## 2018-05-14 VITALS — BP 141/75 | HR 62 | Temp 98.7°F | Resp 16

## 2018-05-14 DIAGNOSIS — R51 Headache: Secondary | ICD-10-CM | POA: Diagnosis not present

## 2018-05-14 DIAGNOSIS — Z170 Estrogen receptor positive status [ER+]: Secondary | ICD-10-CM

## 2018-05-14 DIAGNOSIS — C50511 Malignant neoplasm of lower-outer quadrant of right female breast: Secondary | ICD-10-CM

## 2018-05-14 DIAGNOSIS — Z95828 Presence of other vascular implants and grafts: Secondary | ICD-10-CM

## 2018-05-14 DIAGNOSIS — Z79899 Other long term (current) drug therapy: Secondary | ICD-10-CM | POA: Diagnosis not present

## 2018-05-14 DIAGNOSIS — I10 Essential (primary) hypertension: Secondary | ICD-10-CM | POA: Diagnosis not present

## 2018-05-14 DIAGNOSIS — Z5112 Encounter for antineoplastic immunotherapy: Secondary | ICD-10-CM | POA: Diagnosis not present

## 2018-05-14 DIAGNOSIS — Z5111 Encounter for antineoplastic chemotherapy: Secondary | ICD-10-CM | POA: Diagnosis present

## 2018-05-14 DIAGNOSIS — K1231 Oral mucositis (ulcerative) due to antineoplastic therapy: Secondary | ICD-10-CM | POA: Diagnosis not present

## 2018-05-14 DIAGNOSIS — G4700 Insomnia, unspecified: Secondary | ICD-10-CM | POA: Diagnosis not present

## 2018-05-14 LAB — CMP (CANCER CENTER ONLY)
ALT: 34 U/L (ref 0–44)
AST: 29 U/L (ref 15–41)
Albumin: 3.7 g/dL (ref 3.5–5.0)
Alkaline Phosphatase: 74 U/L (ref 38–126)
Anion gap: 7 (ref 5–15)
BUN: 17 mg/dL (ref 6–20)
CO2: 29 mmol/L (ref 22–32)
Calcium: 9.1 mg/dL (ref 8.9–10.3)
Chloride: 103 mmol/L (ref 98–111)
Creatinine: 0.82 mg/dL (ref 0.44–1.00)
GFR, Est AFR Am: >60 mL/min (ref >60)
GFR, Estimated: >60 mL/min (ref >60)
Glucose, Bld: 107 mg/dL — ABNORMAL HIGH (ref 70–99)
Potassium: 3.6 mmol/L (ref 3.5–5.1)
Sodium: 139 mmol/L (ref 135–145)
Total Bilirubin: 0.4 mg/dL (ref 0.3–1.2)
Total Protein: 6.9 g/dL (ref 6.5–8.1)

## 2018-05-14 LAB — CBC WITH DIFFERENTIAL (CANCER CENTER ONLY)
Abs Immature Granulocytes: 0.04 10*3/uL (ref 0.00–0.07)
Basophils Absolute: 0 10*3/uL (ref 0.0–0.1)
Basophils Relative: 0 %
Eosinophils Absolute: 0 10*3/uL (ref 0.0–0.5)
Eosinophils Relative: 0 %
HCT: 37 % (ref 36.0–46.0)
Hemoglobin: 12.5 g/dL (ref 12.0–15.0)
Immature Granulocytes: 0 %
Lymphocytes Relative: 19 %
Lymphs Abs: 2.3 10*3/uL (ref 0.7–4.0)
MCH: 31.3 pg (ref 26.0–34.0)
MCHC: 33.8 g/dL (ref 30.0–36.0)
MCV: 92.7 fL (ref 80.0–100.0)
Monocytes Absolute: 0.9 10*3/uL (ref 0.1–1.0)
Monocytes Relative: 7 %
Neutro Abs: 9 10*3/uL — ABNORMAL HIGH (ref 1.7–7.7)
Neutrophils Relative %: 74 %
Platelet Count: 174 10*3/uL (ref 150–400)
RBC: 3.99 MIL/uL (ref 3.87–5.11)
RDW: 13.2 % (ref 11.5–15.5)
WBC Count: 12.3 10*3/uL — ABNORMAL HIGH (ref 4.0–10.5)
nRBC: 0 % (ref 0.0–0.2)

## 2018-05-14 MED ORDER — SODIUM CHLORIDE 0.9 % IV SOLN
80.0000 mg/m2 | Freq: Once | INTRAVENOUS | Status: AC
  Administered 2018-05-14: 138 mg via INTRAVENOUS
  Filled 2018-05-14: qty 23

## 2018-05-14 MED ORDER — FAMOTIDINE IN NACL 20-0.9 MG/50ML-% IV SOLN
20.0000 mg | Freq: Once | INTRAVENOUS | Status: AC
  Administered 2018-05-14: 20 mg via INTRAVENOUS

## 2018-05-14 MED ORDER — SODIUM CHLORIDE 0.9% FLUSH
10.0000 mL | INTRAVENOUS | Status: DC | PRN
  Administered 2018-05-14: 17:00:00 10 mL
  Filled 2018-05-14: qty 10

## 2018-05-14 MED ORDER — ACETAMINOPHEN 325 MG PO TABS
ORAL_TABLET | ORAL | Status: AC
  Filled 2018-05-14: qty 2

## 2018-05-14 MED ORDER — ACETAMINOPHEN 325 MG PO TABS
650.0000 mg | ORAL_TABLET | Freq: Once | ORAL | Status: AC
  Administered 2018-05-14: 650 mg via ORAL

## 2018-05-14 MED ORDER — FAMOTIDINE IN NACL 20-0.9 MG/50ML-% IV SOLN
INTRAVENOUS | Status: AC
  Filled 2018-05-14: qty 50

## 2018-05-14 MED ORDER — SODIUM CHLORIDE 0.9% FLUSH
10.0000 mL | INTRAVENOUS | Status: DC | PRN
  Administered 2018-05-14: 10 mL via INTRAVENOUS
  Filled 2018-05-14: qty 10

## 2018-05-14 MED ORDER — SODIUM CHLORIDE 0.9 % IV SOLN
Freq: Once | INTRAVENOUS | Status: AC
  Administered 2018-05-14: 12:00:00 via INTRAVENOUS
  Filled 2018-05-14: qty 250

## 2018-05-14 MED ORDER — DIPHENHYDRAMINE HCL 50 MG/ML IJ SOLN
INTRAMUSCULAR | Status: AC
  Filled 2018-05-14: qty 1

## 2018-05-14 MED ORDER — HEPARIN SOD (PORK) LOCK FLUSH 100 UNIT/ML IV SOLN
500.0000 [IU] | Freq: Once | INTRAVENOUS | Status: AC | PRN
  Administered 2018-05-14: 500 [IU]
  Filled 2018-05-14: qty 5

## 2018-05-14 MED ORDER — TRASTUZUMAB CHEMO 150 MG IV SOLR
300.0000 mg | Freq: Once | INTRAVENOUS | Status: AC
  Administered 2018-05-14: 300 mg via INTRAVENOUS
  Filled 2018-05-14: qty 14.29

## 2018-05-14 MED ORDER — DIPHENHYDRAMINE HCL 50 MG/ML IJ SOLN
50.0000 mg | Freq: Once | INTRAMUSCULAR | Status: AC
  Administered 2018-05-14: 50 mg via INTRAVENOUS

## 2018-05-14 NOTE — Patient Instructions (Signed)

## 2018-05-14 NOTE — Assessment & Plan Note (Signed)
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 Adj Chemo with Taxol-Herceptin weekly X 12 followed by Herceptin q 3 weeks for a year. 2. Adj RT 3. Foll by Adj Anti estrogen therapy. ------------------------------------------------------------------------------------------------------------------------------------------- Current treatment: Cycle 1 day 1 Taxol Herceptin Labs reviewed Echocardiogram: EF 60 to 65% Chemo consent obtained Chemo education completed  Return to clinic in 1 week for toxicity check.

## 2018-05-14 NOTE — Patient Instructions (Signed)
[facility] Discharge Instructions for Patients Receiving Chemotherapy  Today you received the following chemotherapy agents: herceptin; Taxol  To help prevent nausea and vomiting after your treatment, we encourage you to take your nausea medication as directed.   If you develop nausea and vomiting that is not controlled by your nausea medication, call the clinic.   BELOW ARE SYMPTOMS THAT SHOULD BE REPORTED IMMEDIATELY:  *FEVER GREATER THAN 100.5 F  *CHILLS WITH OR WITHOUT FEVER  NAUSEA AND VOMITING THAT IS NOT CONTROLLED WITH YOUR NAUSEA MEDICATION  *UNUSUAL SHORTNESS OF BREATH  *UNUSUAL BRUISING OR BLEEDING  TENDERNESS IN MOUTH AND THROAT WITH OR WITHOUT PRESENCE OF ULCERS  *URINARY PROBLEMS  *BOWEL PROBLEMS  UNUSUAL RASH Items with * indicate a potential emergency and should be followed up as soon as possible.  Feel free to call the clinic should you have any questions or concerns. The clinic phone number is [phone].  Please show the [place] at check-in to the Emergency Department and triage nurse.   Trastuzumab injection for infusion What is this medicine? TRASTUZUMAB (tras TOO zoo mab) is a monoclonal antibody. It is used to treat breast cancer and stomach cancer. This medicine may be used for other purposes; ask your health care provider or pharmacist if you have questions. COMMON BRAND NAME(S): Herceptin, Calla Kicks, OGIVRI What should I tell my health care provider before I take this medicine? They need to know if you have any of these conditions: -heart disease -heart failure -lung or breathing disease, like asthma -an unusual or allergic reaction to trastuzumab, benzyl alcohol, or other medications, foods, dyes, or preservatives -pregnant or trying to get pregnant -breast-feeding How should I use this medicine? This drug is given as an infusion into a vein. It is administered in a hospital or clinic by a specially trained health  care professional. Talk to your pediatrician regarding the use of this medicine in children. This medicine is not approved for use in children. Overdosage: If you think you have taken too much of this medicine contact a poison control center or emergency room at once. NOTE: This medicine is only for you. Do not share this medicine with others. What if I miss a dose? It is important not to miss a dose. Call your doctor or health care professional if you are unable to keep an appointment. What may interact with this medicine? This medicine may interact with the following medications: -certain types of chemotherapy, such as daunorubicin, doxorubicin, epirubicin, and idarubicin This list may not describe all possible interactions. Give your health care provider a list of all the medicines, herbs, non-prescription drugs, or dietary supplements you use. Also tell them if you smoke, drink alcohol, or use illegal drugs. Some items may interact with your medicine. What should I watch for while using this medicine? Visit your doctor for checks on your progress. Report any side effects. Continue your course of treatment even though you feel ill unless your doctor tells you to stop. Call your doctor or health care professional for advice if you get a fever, chills or sore throat, or other symptoms of a cold or flu. Do not treat yourself. Try to avoid being around people who are sick. You may experience fever, chills and shaking during your first infusion. These effects are usually mild and can be treated with other medicines. Report any side effects during the infusion to your health care professional. Fever and chills usually do not happen with later infusions. Do  not become pregnant while taking this medicine or for 7 months after stopping it. Women should inform their doctor if they wish to become pregnant or think they might be pregnant. Women of child-bearing potential will need to have a negative pregnancy  test before starting this medicine. There is a potential for serious side effects to an unborn child. Talk to your health care professional or pharmacist for more information. Do not breast-feed an infant while taking this medicine or for 7 months after stopping it. Women must use effective birth control with this medicine. What side effects may I notice from receiving this medicine? Side effects that you should report to your doctor or health care professional as soon as possible: -allergic reactions like skin rash, itching or hives, swelling of the face, lips, or tongue -chest pain or palpitations -cough -dizziness -feeling faint or lightheaded, falls -fever -general ill feeling or flu-like symptoms -signs of worsening heart failure like breathing problems; swelling in your legs and feet -unusually weak or tired Side effects that usually do not require medical attention (report to your doctor or health care professional if they continue or are bothersome): -bone pain -changes in taste -diarrhea -joint pain -nausea/vomiting -weight loss This list may not describe all possible side effects. Call your doctor for medical advice about side effects. You may report side effects to FDA at 1-800-FDA-1088. Where should I keep my medicine? This drug is given in a hospital or clinic and will not be stored at home. NOTE: This sheet is a summary. It may not cover all possible information. If you have questions about this medicine, talk to your doctor, pharmacist, or health care provider.  2019 Elsevier/Gold Standard (2015-12-13 14:37:52)  Paclitaxel injection What is this medicine? PACLITAXEL (PAK li TAX el) is a chemotherapy drug. It targets fast dividing cells, like cancer cells, and causes these cells to die. This medicine is used to treat ovarian cancer, breast cancer, lung cancer, Kaposi's sarcoma, and other cancers. This medicine may be used for other purposes; ask your health care provider  or pharmacist if you have questions. COMMON BRAND NAME(S): Onxol, Taxol What should I tell my health care provider before I take this medicine? They need to know if you have any of these conditions: -history of irregular heartbeat -liver disease -low blood counts, like low white cell, platelet, or red cell counts -lung or breathing disease, like asthma -tingling of the fingers or toes, or other nerve disorder -an unusual or allergic reaction to paclitaxel, alcohol, polyoxyethylated castor oil, other chemotherapy, other medicines, foods, dyes, or preservatives -pregnant or trying to get pregnant -breast-feeding How should I use this medicine? This drug is given as an infusion into a vein. It is administered in a hospital or clinic by a specially trained health care professional. Talk to your pediatrician regarding the use of this medicine in children. Special care may be needed. Overdosage: If you think you have taken too much of this medicine contact a poison control center or emergency room at once. NOTE: This medicine is only for you. Do not share this medicine with others. What if I miss a dose? It is important not to miss your dose. Call your doctor or health care professional if you are unable to keep an appointment. What may interact with this medicine? Do not take this medicine with any of the following medications: -disulfiram -metronidazole This medicine may also interact with the following medications: -antiviral medicines for hepatitis, HIV or AIDS -certain antibiotics like erythromycin  and clarithromycin -certain medicines for fungal infections like ketoconazole and itraconazole -certain medicines for seizures like carbamazepine, phenobarbital, phenytoin -gemfibrozil -nefazodone -rifampin -St. John's wort This list may not describe all possible interactions. Give your health care provider a list of all the medicines, herbs, non-prescription drugs, or dietary supplements  you use. Also tell them if you smoke, drink alcohol, or use illegal drugs. Some items may interact with your medicine. What should I watch for while using this medicine? Your condition will be monitored carefully while you are receiving this medicine. You will need important blood work done while you are taking this medicine. This medicine can cause serious allergic reactions. To reduce your risk you will need to take other medicine(s) before treatment with this medicine. If you experience allergic reactions like skin rash, itching or hives, swelling of the face, lips, or tongue, tell your doctor or health care professional right away. In some cases, you may be given additional medicines to help with side effects. Follow all directions for their use. This drug may make you feel generally unwell. This is not uncommon, as chemotherapy can affect healthy cells as well as cancer cells. Report any side effects. Continue your course of treatment even though you feel ill unless your doctor tells you to stop. Call your doctor or health care professional for advice if you get a fever, chills or sore throat, or other symptoms of a cold or flu. Do not treat yourself. This drug decreases your body's ability to fight infections. Try to avoid being around people who are sick. This medicine may increase your risk to bruise or bleed. Call your doctor or health care professional if you notice any unusual bleeding. Be careful brushing and flossing your teeth or using a toothpick because you may get an infection or bleed more easily. If you have any dental work done, tell your dentist you are receiving this medicine. Avoid taking products that contain aspirin, acetaminophen, ibuprofen, naproxen, or ketoprofen unless instructed by your doctor. These medicines may hide a fever. Do not become pregnant while taking this medicine. Women should inform their doctor if they wish to become pregnant or think they might be pregnant.  There is a potential for serious side effects to an unborn child. Talk to your health care professional or pharmacist for more information. Do not breast-feed an infant while taking this medicine. Men are advised not to father a child while receiving this medicine. This product may contain alcohol. Ask your pharmacist or healthcare provider if this medicine contains alcohol. Be sure to tell all healthcare providers you are taking this medicine. Certain medicines, like metronidazole and disulfiram, can cause an unpleasant reaction when taken with alcohol. The reaction includes flushing, headache, nausea, vomiting, sweating, and increased thirst. The reaction can last from 30 minutes to several hours. What side effects may I notice from receiving this medicine? Side effects that you should report to your doctor or health care professional as soon as possible: -allergic reactions like skin rash, itching or hives, swelling of the face, lips, or tongue -breathing problems -changes in vision -fast, irregular heartbeat -high or low blood pressure -mouth sores -pain, tingling, numbness in the hands or feet -signs of decreased platelets or bleeding - bruising, pinpoint red spots on the skin, black, tarry stools, blood in the urine -signs of decreased red blood cells - unusually weak or tired, feeling faint or lightheaded, falls -signs of infection - fever or chills, cough, sore throat, pain or difficulty passing urine -  signs and symptoms of liver injury like dark yellow or brown urine; general ill feeling or flu-like symptoms; light-colored stools; loss of appetite; nausea; right upper belly pain; unusually weak or tired; yellowing of the eyes or skin -swelling of the ankles, feet, hands -unusually slow heartbeat Side effects that usually do not require medical attention (report to your doctor or health care professional if they continue or are bothersome): -diarrhea -hair loss -loss of appetite -muscle  or joint pain -nausea, vomiting -pain, redness, or irritation at site where injected -tiredness This list may not describe all possible side effects. Call your doctor for medical advice about side effects. You may report side effects to FDA at 1-800-FDA-1088. Where should I keep my medicine? This drug is given in a hospital or clinic and will not be stored at home. NOTE: This sheet is a summary. It may not cover all possible information. If you have questions about this medicine, talk to your doctor, pharmacist, or health care provider.  2019 Elsevier/Gold Standard (2016-08-21 13:14:55)

## 2018-05-14 NOTE — Progress Notes (Signed)
Went to registration to introduce myself as Arboriculturist and to offer available resources.  Discussed one-time $1000 Radio broadcast assistant to assist with personal expenses while going through treatment. Also discussed available copay assistance for her diagnosis and treatment drug(Herceptin). Patient states she has almost reached her OOP for the year so she should not need.  Gave her my card for any additional financial questions or concerns.

## 2018-05-15 ENCOUNTER — Telehealth: Payer: Self-pay | Admitting: *Deleted

## 2018-05-15 ENCOUNTER — Telehealth: Payer: Self-pay

## 2018-05-15 NOTE — Telephone Encounter (Signed)
Spoke to pt regarding 1st. Chemo. Relate doing well, no c/o N/V. Discussed staying hydrated and taking anti-emetics. Encourage pt to call with needs. Received verbal understanding.

## 2018-05-15 NOTE — Telephone Encounter (Signed)
Nurse placed call to patient to follow up with first time treatment of Taxol.   Pt denies fever, nausea, or diarrhea.  Pt states, "I feel normal."  Nurse answered all questions, no further needs at this time.

## 2018-05-19 ENCOUNTER — Telehealth: Payer: Self-pay | Admitting: *Deleted

## 2018-05-19 NOTE — Progress Notes (Signed)
Patient Care Team: [name], MD as PCP - General (Family Medicine)  DIAGNOSIS:    ICD-10-CM   1. Malignant neoplasm of lower-outer quadrant of right breast of female, estrogen receptor positive ([place]) C50.511    Z17.0     SUMMARY OF ONCOLOGIC HISTORY:   Malignant neoplasm of lower-outer quadrant of right breast of female, estrogen receptor positive ([place])   03/06/2018 Initial Diagnosis    Screening mammogram detected right breast calcifications, 8 mm mass detected by ultrasound, axilla negative, biopsy revealed grade 2 IDC with high-grade DCIS, ER 100%, PR 1%, Ki-67 30%, HER-2 +3+ by IHC, T1BN0 stage Ia    03/19/2018 Cancer Staging    Staging form: Breast, AJCC 8th Edition - Clinical stage from 03/19/2018: Stage IA (cT1b, cN0, cM0, G2, ER+, PR+, HER2+) - Signed by Nicholas Lose, MD on 03/19/2018    03/24/2018 Surgery    Right lumpectomy [name]): IDC with DCIS, grade 3, 0.6cm, ER+ (100%), PR+ (1%), HER2+ (3+), Ki67 30%, clear margins, 0.4 SLN negative for carcinoma.     05/14/2018 -  Chemotherapy    The patient had trastuzumab (HERCEPTIN) 300 mg in sodium chloride 0.9 % 250 mL chemo infusion, 273 mg, Intravenous,  Once, 1 of 16 cycles Administration: 300 mg (05/14/2018) PACLitaxel (TAXOL) 138 mg in sodium chloride 0.9 % 250 mL chemo infusion (</= 106m/m2), 80 mg/m2 = 138 mg, Intravenous,  Once, 1 of 3 cycles Administration: 138 mg (05/14/2018)  for chemotherapy treatment.      CHIEF COMPLIANT: Cycle 2 Taxol Herceptin  INTERVAL HISTORY: Kim Emamiis a 58y.o. with above-mentioned history of right breast cancer who underwent a lumpectomy and is currently on adjuvant chemotherapy with weekly Taxol and Herceptin. She presents to the clinic today for cycle 2.   REVIEW OF SYSTEMS:   Constitutional: Denies fevers, chills or abnormal weight loss Eyes: Denies blurriness of vision Ears, nose, mouth, throat, and face: Denies mucositis or sore throat Respiratory: Denies  cough, dyspnea or wheezes Cardiovascular: Denies palpitation, chest discomfort Gastrointestinal: Denies nausea, heartburn or change in bowel habits Skin: Denies abnormal skin rashes Lymphatics: Denies new lymphadenopathy or easy bruising Neurological: Denies numbness, tingling or new weaknesses Behavioral/Psych: Mood is stable, no new changes  Extremities: No lower extremity edema Breast: denies any pain or lumps or nodules in either breasts All other systems were reviewed with the patient and are negative.  I have reviewed the past medical history, past surgical history, social history and family history with the patient and they are unchanged from previous note.  ALLERGIES:  has No Known Allergies.  MEDICATIONS:  Current Outpatient Medications  Medication Sig Dispense Refill  . albuterol (PROVENTIL HFA;VENTOLIN HFA) 108 (90 Base) MCG/ACT inhaler Inhale into the lungs every 6 (six) hours as needed for wheezing or shortness of breath.    .Marland Kitchenatenolol-chlorthalidone (TENORETIC) 100-25 MG tablet Take 1 tablet by mouth daily.    . clonazePAM (KLONOPIN) 0.5 MG tablet Take 0.5 mg by mouth at bedtime.    . lidocaine-prilocaine (EMLA) cream Apply to affected area once 30 g 3  . Melatonin 10 MG TABS Take by mouth.    .Marland Kitchenomeprazole (PRILOSEC) 20 MG capsule Take 20 mg by mouth daily.    . ondansetron (ZOFRAN) 8 MG tablet Take 1 tablet (8 mg total) by mouth 2 (two) times daily as needed (Nausea or vomiting). 30 tablet 1  . potassium chloride SA (K-DUR,KLOR-CON) 20 MEQ tablet Take 20 mEq by mouth 2 (two) times daily.    .[name]  prochlorperazine (COMPAZINE) 10 MG tablet Take 1 tablet (10 mg total) by mouth every 6 (six) hours as needed (Nausea or vomiting). 30 tablet 1   No current facility-administered medications for this visit.     PHYSICAL EXAMINATION: ECOG PERFORMANCE STATUS: 1 - Symptomatic but completely ambulatory  Vitals:   05/20/18 1014  BP: 131/76  Pulse: 68  Resp: 17  Temp: 98.2 F (36.8  C)  SpO2: 100%   Filed Weights   05/20/18 1014  Weight: 155 lb 14.4 oz (70.7 kg)    GENERAL: alert, no distress and comfortable SKIN: skin color, texture, turgor are normal, no rashes or significant lesions EYES: normal, Conjunctiva are pink and non-injected, sclera clear OROPHARYNX: no exudate, no erythema and lips, buccal mucosa, and tongue normal  NECK: supple, thyroid normal size, non-tender, without nodularity LYMPH: no palpable lymphadenopathy in the cervical, axillary or inguinal LUNGS: clear to auscultation and percussion with normal breathing effort HEART: regular rate & rhythm and no murmurs and no lower extremity edema ABDOMEN: abdomen soft, non-tender and normal bowel sounds MUSCULOSKELETAL: no cyanosis of digits and no clubbing  NEURO: alert & oriented x 3 with fluent speech, no focal motor/sensory deficits EXTREMITIES: No lower extremity edema  LABORATORY DATA:  I have reviewed the data as listed CMP Latest Ref Rng & Units 05/14/2018 05/09/2018 03/24/2018  Glucose 70 - 99 mg/dL 107(H) 95 84  BUN 6 - 20 mg/dL _0 Creatinine 0.44 - 1.00 mg/dL 0.82 0.76 0.75  Sodium 135 - 145 mmol/L 139 137 137  Potassium 3.5 - 5.1 mmol/L 3.6 4.1 3.3(L)  Chloride 98 - 111 mmol/L 103 101 102  CO2 22 - 32 mmol/L _1 Calcium 8.9 - 10.3 mg/dL 9.1 9.6 9.5  Total Protein 6.5 - 8.1 g/dL 6.9 - -  Total Bilirubin 0.3 - 1.2 mg/dL 0.4 - -  Alkaline Phos 38 - 126 U/L 74 - -  AST 15 - 41 U/L 29 - -  ALT 0 - 44 U/L 34 - -    Lab Results  Component Value Date   WBC 5.0 05/20/2018   HGB 12.8 05/20/2018   HCT 38.1 05/20/2018   MCV 92.9 05/20/2018   PLT 206 05/20/2018   NEUTROABS 3.0 05/20/2018    ASSESSMENT & PLAN:  Malignant neoplasm of lower-outer quadrant of right breast of female, estrogen receptor positive ([place]) 03/06/2018:Screening mammogram detected right breast calcifications, 8 mm mass detected by ultrasound, axilla negative, biopsy revealed grade 2 IDC with high-grade  DCIS, ER 100%, PR 1%, Ki-67 30%, HER-2 +3+ by IHC, T1BN0 stage Ia  3.23.20: Rt Lumpectomy Grade 2 IDC 0.6 cm, 0/4 LN Neg, Er 100%, PR 1%, Her 2: 3+ pos, T1bN0 Stage 1A  Treatment plan: 1. Adj Chemo with Taxol-Herceptin weekly X 12 followed by Herceptin q 3 weeks for a year. 2. Adj RT 3. Foll by Adj Anti estrogen therapy. ------------------------------------------------------------------------------------------------------------------------------------------- Current treatment: Cycle 2 day 1 Taxol Herceptin Labs reviewed Echocardiogram: EF 60 to 65%  Chemo toxicities: 1.  Intermittent headaches which are mild Tolerated the treatment extremely well.  Previous problems with carpal tunnel syndrome.  Return to clinic weekly for Taxol every other week for follow-up with me.  No orders of the defined types were placed in this encounter.  The patient has a good understanding of the overall plan. she agrees with it. she will call with any problems that may develop before the next visit here.  Nicholas Lose, MD 05/20/2018  Kim Montgomery  am acting as scribe for Dr. Nicholas Lose.  I have reviewed the above documentation for accuracy and completeness, and I agree with the above.

## 2018-05-20 ENCOUNTER — Inpatient Hospital Stay: Payer: Managed Care, Other (non HMO)

## 2018-05-20 ENCOUNTER — Inpatient Hospital Stay: Payer: Managed Care, Other (non HMO) | Admitting: Hematology and Oncology

## 2018-05-20 ENCOUNTER — Other Ambulatory Visit: Payer: Self-pay

## 2018-05-20 DIAGNOSIS — Z170 Estrogen receptor positive status [ER+]: Secondary | ICD-10-CM

## 2018-05-20 DIAGNOSIS — R51 Headache: Secondary | ICD-10-CM | POA: Diagnosis not present

## 2018-05-20 DIAGNOSIS — C50511 Malignant neoplasm of lower-outer quadrant of right female breast: Secondary | ICD-10-CM | POA: Diagnosis not present

## 2018-05-20 DIAGNOSIS — Z5112 Encounter for antineoplastic immunotherapy: Secondary | ICD-10-CM | POA: Diagnosis not present

## 2018-05-20 DIAGNOSIS — Z95828 Presence of other vascular implants and grafts: Secondary | ICD-10-CM

## 2018-05-20 LAB — CBC WITH DIFFERENTIAL (CANCER CENTER ONLY)
Abs Immature Granulocytes: 0.04 10*3/uL (ref 0.00–0.07)
Basophils Absolute: 0 10*3/uL (ref 0.0–0.1)
Basophils Relative: 1 %
Eosinophils Absolute: 0.2 10*3/uL (ref 0.0–0.5)
Eosinophils Relative: 5 %
HCT: 38.1 % (ref 36.0–46.0)
Hemoglobin: 12.8 g/dL (ref 12.0–15.0)
Immature Granulocytes: 1 %
Lymphocytes Relative: 29 %
Lymphs Abs: 1.4 10*3/uL (ref 0.7–4.0)
MCH: 31.2 pg (ref 26.0–34.0)
MCHC: 33.6 g/dL (ref 30.0–36.0)
MCV: 92.9 fL (ref 80.0–100.0)
Monocytes Absolute: 0.2 10*3/uL (ref 0.1–1.0)
Monocytes Relative: 4 %
Neutro Abs: 3 10*3/uL (ref 1.7–7.7)
Neutrophils Relative %: 60 %
Platelet Count: 206 10*3/uL (ref 150–400)
RBC: 4.1 MIL/uL (ref 3.87–5.11)
RDW: 12.5 % (ref 11.5–15.5)
WBC Count: 5 10*3/uL (ref 4.0–10.5)
nRBC: 0 % (ref 0.0–0.2)

## 2018-05-20 LAB — CMP (CANCER CENTER ONLY)
ALT: 52 U/L — ABNORMAL HIGH (ref 0–44)
AST: 42 U/L — ABNORMAL HIGH (ref 15–41)
Albumin: 3.8 g/dL (ref 3.5–5.0)
Alkaline Phosphatase: 81 U/L (ref 38–126)
Anion gap: 9 (ref 5–15)
BUN: 15 mg/dL (ref 6–20)
CO2: 27 mmol/L (ref 22–32)
Calcium: 9.8 mg/dL (ref 8.9–10.3)
Chloride: 102 mmol/L (ref 98–111)
Creatinine: 0.79 mg/dL (ref 0.44–1.00)
GFR, Est AFR Am: >60 mL/min (ref >60)
GFR, Estimated: >60 mL/min (ref >60)
Glucose, Bld: 102 mg/dL — ABNORMAL HIGH (ref 70–99)
Potassium: 3.7 mmol/L (ref 3.5–5.1)
Sodium: 138 mmol/L (ref 135–145)
Total Bilirubin: 0.7 mg/dL (ref 0.3–1.2)
Total Protein: 7.2 g/dL (ref 6.5–8.1)

## 2018-05-20 MED ORDER — HEPARIN SOD (PORK) LOCK FLUSH 100 UNIT/ML IV SOLN
500.0000 [IU] | Freq: Once | INTRAVENOUS | Status: AC | PRN
  Administered 2018-05-20: 14:00:00 500 [IU]
  Filled 2018-05-20: qty 5

## 2018-05-20 MED ORDER — DIPHENHYDRAMINE HCL 50 MG/ML IJ SOLN
50.0000 mg | Freq: Once | INTRAMUSCULAR | Status: AC
  Administered 2018-05-20: 11:00:00 50 mg via INTRAVENOUS

## 2018-05-20 MED ORDER — SODIUM CHLORIDE 0.9 % IV SOLN
Freq: Once | INTRAVENOUS | Status: AC
  Administered 2018-05-20: 11:00:00 via INTRAVENOUS
  Filled 2018-05-20: qty 250

## 2018-05-20 MED ORDER — ACETAMINOPHEN 325 MG PO TABS
650.0000 mg | ORAL_TABLET | Freq: Once | ORAL | Status: AC
  Administered 2018-05-20: 11:00:00 650 mg via ORAL

## 2018-05-20 MED ORDER — SODIUM CHLORIDE 0.9% FLUSH
10.0000 mL | INTRAVENOUS | Status: DC | PRN
  Administered 2018-05-20: 14:00:00 10 mL
  Filled 2018-05-20: qty 10

## 2018-05-20 MED ORDER — TRASTUZUMAB CHEMO 150 MG IV SOLR
150.0000 mg | Freq: Once | INTRAVENOUS | Status: AC
  Administered 2018-05-20: 150 mg via INTRAVENOUS
  Filled 2018-05-20: qty 7.14

## 2018-05-20 MED ORDER — FAMOTIDINE IN NACL 20-0.9 MG/50ML-% IV SOLN
20.0000 mg | Freq: Once | INTRAVENOUS | Status: AC
  Administered 2018-05-20: 20 mg via INTRAVENOUS

## 2018-05-20 MED ORDER — SODIUM CHLORIDE 0.9 % IV SOLN
80.0000 mg/m2 | Freq: Once | INTRAVENOUS | Status: AC
  Administered 2018-05-20: 138 mg via INTRAVENOUS
  Filled 2018-05-20: qty 23

## 2018-05-20 MED ORDER — SODIUM CHLORIDE 0.9% FLUSH
10.0000 mL | INTRAVENOUS | Status: DC | PRN
  Administered 2018-05-20: 10 mL via INTRAVENOUS
  Filled 2018-05-20: qty 10

## 2018-05-20 MED ORDER — COLD PACK MISC ONCOLOGY
1.0000 | Freq: Once | Status: AC | PRN
  Administered 2018-05-20: 12:00:00 1 via TOPICAL
  Filled 2018-05-20: qty 1

## 2018-05-20 MED ORDER — DIPHENHYDRAMINE HCL 50 MG/ML IJ SOLN
INTRAMUSCULAR | Status: AC
  Filled 2018-05-20: qty 1

## 2018-05-20 MED ORDER — FAMOTIDINE IN NACL 20-0.9 MG/50ML-% IV SOLN
INTRAVENOUS | Status: AC
  Filled 2018-05-20: qty 50

## 2018-05-20 MED ORDER — ACETAMINOPHEN 325 MG PO TABS
ORAL_TABLET | ORAL | Status: AC
  Filled 2018-05-20: qty 2

## 2018-05-20 NOTE — Patient Instructions (Signed)
[place] Discharge Instructions for Patients Receiving Chemotherapy  Today you received the following chemotherapy agents: Taxol other agent: Herceptin  To help prevent nausea and vomiting after your treatment, we encourage you to take your nausea medication: As directed by your MD   If you develop nausea and vomiting that is not controlled by your nausea medication, call the clinic.   BELOW ARE SYMPTOMS THAT SHOULD BE REPORTED IMMEDIATELY:  *FEVER GREATER THAN 100.5 F  *CHILLS WITH OR WITHOUT FEVER  NAUSEA AND VOMITING THAT IS NOT CONTROLLED WITH YOUR NAUSEA MEDICATION  *UNUSUAL SHORTNESS OF BREATH  *UNUSUAL BRUISING OR BLEEDING  TENDERNESS IN MOUTH AND THROAT WITH OR WITHOUT PRESENCE OF ULCERS  *URINARY PROBLEMS  *BOWEL PROBLEMS  UNUSUAL RASH Items with * indicate a potential emergency and should be followed up as soon as possible.  Feel free to call the clinic should you have any questions or concerns. The clinic phone number is [phone].  Please show the [facility] at check-in to the Emergency Department and triage nurse.  Coronavirus (COVID-19) Are you at risk?  Are you at risk for the Coronavirus (COVID-19)?  To be considered HIGH RISK for Coronavirus (COVID-19), you have to meet the following criteria:  . Traveled to [place], [place], [place], [place] or [place]; or in the [place] to [place], [place], [place], or [place]; and have fever, cough, and shortness of breath within the last 2 weeks of travel OR . Been in close contact with a person diagnosed with COVID-19 within the last 2 weeks and have fever, cough, and shortness of breath . IF YOU DO NOT MEET THESE CRITERIA, YOU ARE CONSIDERED LOW RISK FOR COVID-19.  What to do if you are HIGH RISK for COVID-19?  [name] If you are having a medical emergency, call 911. . Seek medical care right away. Before you go to a doctor's office, urgent care or emergency department, call ahead  and tell them about your recent travel, contact with someone diagnosed with COVID-19, and your symptoms. You should receive instructions from your physician's office regarding next steps of care.  . When you arrive at healthcare provider, tell the healthcare staff immediately you have returned from visiting [place], [place], [place], [place] or [place]; or traveled in the [place] to [place], [place], [place], or [place]; in the last two weeks or you have been in close contact with a person diagnosed with COVID-19 in the last 2 weeks.   . Tell the health care staff about your symptoms: fever, cough and shortness of breath. . After you have been seen by a medical provider, you will be either: o Tested for (COVID-19) and discharged home on quarantine except to seek medical care if symptoms worsen, and asked to  - Stay home and avoid contact with others until you get your results (4-5 days)  - Avoid travel on public transportation if possible (such as bus, train, or airplane) or o Sent to the Emergency Department by EMS for evaluation, COVID-19 testing, and possible admission depending on your condition and test results.  What to do if you are LOW RISK for COVID-19?  Reduce your risk of any infection by using the same precautions used for avoiding the common cold or flu:  [name] Wash your hands often with soap and warm water for at least 20 seconds.  If soap and water are not readily available, use an alcohol-based hand sanitizer with at least 60% alcohol.  [name]  If coughing or sneezing, cover your mouth and nose by coughing or sneezing into the elbow areas of your shirt or coat, into a tissue or into your sleeve (not your hands). . Avoid shaking hands with others and consider head nods or verbal greetings only. . Avoid touching your eyes, nose, or mouth with unwashed hands.  . Avoid close contact with people who are sick. . Avoid places or events with large numbers of people in one location, like  concerts or sporting events. . Carefully consider travel plans you have or are making. . If you are planning any travel outside or inside the [place], visit the CDC's Travelers' Health webpage for the latest health notices. . If you have some symptoms but not all symptoms, continue to monitor at home and seek medical attention if your symptoms worsen. . If you are having a medical emergency, call 911.   [place] / e-Visit: [email or web address]         [facility]: [facility]: [phone]                   [facility]: [phone]

## 2018-05-20 NOTE — Patient Instructions (Signed)

## 2018-05-22 ENCOUNTER — Telehealth: Payer: Self-pay | Admitting: *Deleted

## 2018-05-27 ENCOUNTER — Other Ambulatory Visit: Payer: Self-pay | Admitting: Hematology and Oncology

## 2018-05-27 ENCOUNTER — Inpatient Hospital Stay: Payer: Managed Care, Other (non HMO)

## 2018-05-27 ENCOUNTER — Other Ambulatory Visit: Payer: Self-pay | Admitting: Medical

## 2018-05-27 ENCOUNTER — Other Ambulatory Visit: Payer: Self-pay

## 2018-05-27 ENCOUNTER — Telehealth: Payer: Self-pay | Admitting: Emergency Medicine

## 2018-05-27 ENCOUNTER — Inpatient Hospital Stay (HOSPITAL_BASED_OUTPATIENT_CLINIC_OR_DEPARTMENT_OTHER): Payer: Managed Care, Other (non HMO) | Admitting: Medical

## 2018-05-27 VITALS — BP 124/73 | HR 72 | Temp 98.6°F | Resp 16 | Wt 153.0 lb

## 2018-05-27 DIAGNOSIS — Z5112 Encounter for antineoplastic immunotherapy: Secondary | ICD-10-CM | POA: Diagnosis not present

## 2018-05-27 DIAGNOSIS — I10 Essential (primary) hypertension: Secondary | ICD-10-CM | POA: Diagnosis not present

## 2018-05-27 DIAGNOSIS — C50511 Malignant neoplasm of lower-outer quadrant of right female breast: Secondary | ICD-10-CM | POA: Diagnosis not present

## 2018-05-27 DIAGNOSIS — K1231 Oral mucositis (ulcerative) due to antineoplastic therapy: Secondary | ICD-10-CM | POA: Diagnosis not present

## 2018-05-27 DIAGNOSIS — Z170 Estrogen receptor positive status [ER+]: Secondary | ICD-10-CM | POA: Diagnosis not present

## 2018-05-27 DIAGNOSIS — Z95828 Presence of other vascular implants and grafts: Secondary | ICD-10-CM | POA: Insufficient documentation

## 2018-05-27 LAB — CBC WITH DIFFERENTIAL (CANCER CENTER ONLY)
Abs Immature Granulocytes: 0.01 10*3/uL (ref 0.00–0.07)
Basophils Absolute: 0 10*3/uL (ref 0.0–0.1)
Basophils Relative: 1 %
Eosinophils Absolute: 0.2 10*3/uL (ref 0.0–0.5)
Eosinophils Relative: 5 %
HCT: 37.6 % (ref 36.0–46.0)
Hemoglobin: 12.7 g/dL (ref 12.0–15.0)
Immature Granulocytes: 0 %
Lymphocytes Relative: 45 %
Lymphs Abs: 1.4 10*3/uL (ref 0.7–4.0)
MCH: 30.5 pg (ref 26.0–34.0)
MCHC: 33.8 g/dL (ref 30.0–36.0)
MCV: 90.2 fL (ref 80.0–100.0)
Monocytes Absolute: 0.2 10*3/uL (ref 0.1–1.0)
Monocytes Relative: 6 %
Neutro Abs: 1.4 10*3/uL — ABNORMAL LOW (ref 1.7–7.7)
Neutrophils Relative %: 43 %
Platelet Count: 273 10*3/uL (ref 150–400)
RBC: 4.17 MIL/uL (ref 3.87–5.11)
RDW: 12 % (ref 11.5–15.5)
WBC Count: 3.2 10*3/uL — ABNORMAL LOW (ref 4.0–10.5)
nRBC: 0 % (ref 0.0–0.2)

## 2018-05-27 LAB — CMP (CANCER CENTER ONLY)
ALT: 48 U/L — ABNORMAL HIGH (ref 0–44)
AST: 35 U/L (ref 15–41)
Albumin: 4.1 g/dL (ref 3.5–5.0)
Alkaline Phosphatase: 87 U/L (ref 38–126)
Anion gap: 9 (ref 5–15)
BUN: 16 mg/dL (ref 6–20)
CO2: 29 mmol/L (ref 22–32)
Calcium: 10.1 mg/dL (ref 8.9–10.3)
Chloride: 101 mmol/L (ref 98–111)
Creatinine: 0.86 mg/dL (ref 0.44–1.00)
GFR, Est AFR Am: >60 mL/min (ref >60)
GFR, Estimated: >60 mL/min (ref >60)
Glucose, Bld: 104 mg/dL — ABNORMAL HIGH (ref 70–99)
Potassium: 3.6 mmol/L (ref 3.5–5.1)
Sodium: 139 mmol/L (ref 135–145)
Total Bilirubin: 0.6 mg/dL (ref 0.3–1.2)
Total Protein: 7.6 g/dL (ref 6.5–8.1)

## 2018-05-27 MED ORDER — DIPHENHYDRAMINE HCL 50 MG/ML IJ SOLN
INTRAMUSCULAR | Status: AC
  Filled 2018-05-27: qty 1

## 2018-05-27 MED ORDER — SODIUM CHLORIDE 0.9 % IV SOLN
65.0000 mg/m2 | Freq: Once | INTRAVENOUS | Status: AC
  Administered 2018-05-27: 114 mg via INTRAVENOUS
  Filled 2018-05-27: qty 19

## 2018-05-27 MED ORDER — HEPARIN SOD (PORK) LOCK FLUSH 100 UNIT/ML IV SOLN
500.0000 [IU] | Freq: Once | INTRAVENOUS | Status: AC | PRN
  Administered 2018-05-27: 13:00:00 500 [IU]
  Filled 2018-05-27: qty 5

## 2018-05-27 MED ORDER — SODIUM CHLORIDE 0.9% FLUSH
10.0000 mL | INTRAVENOUS | Status: DC | PRN
  Administered 2018-05-27: 10 mL
  Filled 2018-05-27: qty 10

## 2018-05-27 MED ORDER — SODIUM CHLORIDE 0.9 % IV SOLN
Freq: Once | INTRAVENOUS | Status: AC
  Administered 2018-05-27: 09:00:00 via INTRAVENOUS
  Filled 2018-05-27: qty 250

## 2018-05-27 MED ORDER — ACETAMINOPHEN 325 MG PO TABS
650.0000 mg | ORAL_TABLET | Freq: Once | ORAL | Status: AC
  Administered 2018-05-27: 11:00:00 650 mg via ORAL

## 2018-05-27 MED ORDER — FAMOTIDINE IN NACL 20-0.9 MG/50ML-% IV SOLN
20.0000 mg | Freq: Once | INTRAVENOUS | Status: AC
  Administered 2018-05-27: 11:00:00 20 mg via INTRAVENOUS

## 2018-05-27 MED ORDER — ACETAMINOPHEN 325 MG PO TABS
ORAL_TABLET | ORAL | Status: AC
  Filled 2018-05-27: qty 2

## 2018-05-27 MED ORDER — MAGIC MOUTHWASH: 5.0000 mL | Freq: Four times a day (QID) | ORAL | 3 refills | Status: DC | PRN

## 2018-05-27 MED ORDER — SODIUM CHLORIDE 0.9% FLUSH
10.0000 mL | INTRAVENOUS | Status: DC | PRN
  Administered 2018-05-27: 13:00:00 10 mL
  Filled 2018-05-27: qty 10

## 2018-05-27 MED ORDER — TRASTUZUMAB CHEMO 150 MG IV SOLR
2.0000 mg/kg | Freq: Once | INTRAVENOUS | Status: AC
  Administered 2018-05-27: 12:00:00 147 mg via INTRAVENOUS
  Filled 2018-05-27: qty 7

## 2018-05-27 MED ORDER — LIDOCAINE VISCOUS HCL 2 % MT SOLN: 5.0000 mL | OROMUCOSAL | 2 refills | Status: DC | PRN

## 2018-05-27 MED ORDER — MUGARD MT LIQD: 5.0000 mL | Freq: Every day | OROMUCOSAL | 3 refills | Status: DC

## 2018-05-27 MED ORDER — FAMOTIDINE IN NACL 20-0.9 MG/50ML-% IV SOLN
INTRAVENOUS | Status: AC
  Filled 2018-05-27: qty 50

## 2018-05-27 MED ORDER — DIPHENHYDRAMINE HCL 50 MG/ML IJ SOLN
50.0000 mg | Freq: Once | INTRAMUSCULAR | Status: AC
  Administered 2018-05-27: 11:00:00 50 mg via INTRAVENOUS

## 2018-05-27 NOTE — Addendum Note (Signed)
Addended by: Harle Stanford on: 05/27/2018 10:23 AM   Modules accepted: Orders

## 2018-05-27 NOTE — Progress Notes (Signed)
Per Dr Lindi Adie OK to treat with ANC 1.4

## 2018-05-27 NOTE — Assessment & Plan Note (Signed)
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 Adj Chemo with Taxol-Herceptin weekly X 12 followed by Herceptin q 3 weeks for a year. 2. Adj RT 3. Foll by Adj Anti estrogen therapy. ------------------------------------------------------------------------------------------------------------------------------------------- Current treatment: Cycle 4 day 1 Taxol Herceptin Labs reviewed Echocardiogram: EF 60 to 65%  Chemo toxicities: 1.  Intermittent headaches which are mild Tolerated the treatment extremely well.  Previous problems with carpal tunnel syndrome.  Return to clinic weekly for Taxol every other week for follow-up with me.

## 2018-05-27 NOTE — Progress Notes (Signed)
Symptoms Management Clinic Progress Note   Kim Montgomery [id number] 06-23-1960 58 y.o.  Kim Montgomery is managed by Dr. Nicholas Lose  Actively treated with chemotherapy/immunotherapy/hormonal therapy: yes  Current therapy: Herceptin and paclitaxel  Last treated: 05/20/2018 (cycle 1, day 8)  Next scheduled appointment with provider: 06/03/2018  Assessment: Plan:    Mucositis due to chemotherapy - Plan: Oral Wound Care Products (MUGARD) LIQD, lidocaine (XYLOCAINE) 2 % solution, magic mouthwash SOLN  Malignant neoplasm of lower-outer quadrant of right breast of female, estrogen receptor positive ([place])   Mucositis: The patient was given prescription for Mugard, viscous lidocaine, and Magic Mouthwash.  ER positive right breast cancer: The patient continues to be followed by Dr. Lindi Adie and is receiving cycle I, day 15 of Herceptin and paclitaxel today. She will next be seen on 06/03/2018.   Please see After Visit Summary for patient specific instructions.  Future Appointments  Date Time Provider [place]  06/03/2018  9:15 AM [facility] LAB 2 [facility] None  06/03/2018  9:30 AM [facility] FLUSH [facility] None  06/03/2018 10:00 AM Nicholas Lose, MD [facility] None  06/03/2018 11:00 AM [facility] INFUSION [facility] None  06/10/2018  8:45 AM [facility] LAB 2 [facility] None  06/10/2018  9:00 AM [facility] [place] FLUSH [facility] None  06/10/2018  9:30 AM [facility] INFUSION [facility] None  06/17/2018  1:00 PM [facility] LAB 5 [facility] None  06/17/2018  1:15 PM [facility] [place] FLUSH [facility] None  06/17/2018  1:45 PM Nicholas Lose, MD [facility] None  06/17/2018  2:30 PM [facility] INFUSION [facility] None    No orders of the defined types were placed in this encounter.      Subjective:   Patient ID:  Kim Montgomery is a 58 y.o. (DOB 1960/01/11) female.  Chief Complaint: No chief complaint on file.   HPI [name]  is a  58 year old female with a diagnosis of a ER positive malignant neoplasm of the right breast who is managed by Dr. Nicholas Lose.  She is status post cycle 1, day 8 of Herceptin and paclitaxel which was dosed on 05/20/2018.  She is seen in the infusion room today as she is receiving cycle 1, day 15 of Herceptin and paclitaxel.  She has developed mouth sores.  Medications: I have reviewed the patient's current medications.  Allergies: No Known Allergies  Past Medical History:  Diagnosis Date  . Cancer ([place]) 03/2018   right breast cancer  . GERD (gastroesophageal reflux disease)   . Hypertension   . Insomnia   . Seasonal allergies     Past Surgical History:  Procedure Laterality Date  . BREAST LUMPECTOMY WITH RADIOACTIVE SEED AND SENTINEL LYMPH NODE BIOPSY Right 03/24/2018   Procedure: RIGHT BREAST LUMPECTOMY WITH RADIOACTIVE SEED AND RIGHT AXILLARY SENTINEL LYMPH NODE BIOPSY;  Surgeon: Rolm Bookbinder, MD;  Location: [facility];  Service: General;  Laterality: Right;  . CYST REMOVAL NECK     at age 34  . FOOT SURGERY Right   . PORTACATH PLACEMENT N/A 05/13/2018   Procedure: INSERTION PORT-A-CATH WITH ULTRASOUND;  Surgeon: Rolm Bookbinder, MD;  Location: [facility];  Service: General;  Laterality: N/A;    Family History  Problem Relation Age of Onset  . Breast cancer Sister        early to mid 48's    Social History   Socioeconomic History  . Marital status: Single    Spouse name: Not on file  . Number of children: Not on  file  . Years of education: Not on file  . Highest education level: Not on file  Occupational History  . Not on file  Social Needs  . Financial resource strain: Not on file  . Food insecurity:    Worry: Not on file    Inability: Not on file  . Transportation needs:    Medical: Not on file    Non-medical: Not on file  Tobacco Use  . Smoking status: Never Smoker  . Smokeless tobacco: Never Used  Substance and Sexual  Activity  . Alcohol use: Yes    Comment: social  . Drug use: Never  . Sexual activity: Not on file  Lifestyle  . Physical activity:    Days per week: Not on file    Minutes per session: Not on file  . Stress: Not on file  Relationships  . Social connections:    Talks on phone: Not on file    Gets together: Not on file    Attends religious service: Not on file    Active member of club or organization: Not on file    Attends meetings of clubs or organizations: Not on file    Relationship status: Not on file  . Intimate partner violence:    Fear of current or ex partner: Not on file    Emotionally abused: Not on file    Physically abused: Not on file    Forced sexual activity: Not on file  Other Topics Concern  . Not on file  Social History Narrative  . Not on file    Past Medical History, Surgical history, Social history, and Family history were reviewed and updated as appropriate.   Please see review of systems for further details on the patient's review from today.   Review of Systems:  Review of Systems  Constitutional: Positive for appetite change. Negative for chills, diaphoresis and fever.  HENT: Positive for mouth sores. Negative for sore throat and trouble swallowing.   Respiratory: Negative for cough, shortness of breath and wheezing.   Cardiovascular: Negative for chest pain and palpitations.  Gastrointestinal: Negative for constipation, diarrhea, nausea and vomiting.    Objective:   Physical Exam:  There were no vitals taken for this visit. ECOG: 0  Physical Exam Constitutional:      General: She is not in acute distress.    Appearance: She is not toxic-appearing or diaphoretic.  HENT:     Head: Normocephalic and atraumatic.     Mouth/Throat:     Mouth: Mucous membranes are moist.     Pharynx: No oropharyngeal exudate or posterior oropharyngeal erythema.     Comments: Superficial ulcerations of the bilateral tongue greater on the right than left.  Eyes:     General: No scleral icterus.       Right eye: No discharge.        Left eye: No discharge.  Cardiovascular:     Rate and Rhythm: Normal rate and regular rhythm.     Heart sounds: Normal heart sounds. No murmur. No friction rub. No gallop.   Pulmonary:     Effort: Pulmonary effort is normal. No respiratory distress.     Breath sounds: Normal breath sounds. No wheezing or rales.  Skin:    General: Skin is warm and dry.  Neurological:     Mental Status: She is alert.  Psychiatric:        Mood and Affect: Mood normal.        Behavior: Behavior normal.  Thought Content: Thought content normal.        Judgment: Judgment normal.     Lab Review:     Component Value Date/Time   NA 139 05/27/2018 0842   K 3.6 05/27/2018 0842   CL 101 05/27/2018 0842   CO2 29 05/27/2018 0842   GLUCOSE 104 (H) 05/27/2018 0842   BUN 16 05/27/2018 0842   CREATININE 0.86 05/27/2018 0842   CALCIUM 10.1 05/27/2018 0842   PROT 7.6 05/27/2018 0842   ALBUMIN 4.1 05/27/2018 0842   AST 35 05/27/2018 0842   ALT 48 (H) 05/27/2018 0842   ALKPHOS 87 05/27/2018 0842   BILITOT 0.6 05/27/2018 0842   GFRNONAA >60 05/27/2018 0842   GFRAA >60 05/27/2018 0842       Component Value Date/Time   WBC 3.2 (L) 05/27/2018 0842   RBC 4.17 05/27/2018 0842   HGB 12.7 05/27/2018 0842   HCT 37.6 05/27/2018 0842   PLT 273 05/27/2018 0842   MCV 90.2 05/27/2018 0842   MCH 30.5 05/27/2018 0842   MCHC 33.8 05/27/2018 0842   RDW 12.0 05/27/2018 0842   LYMPHSABS 1.4 05/27/2018 0842   MONOABS 0.2 05/27/2018 0842   EOSABS 0.2 05/27/2018 0842   BASOSABS 0.0 05/27/2018 0842   -------------------------------  Imaging from last 24 hours (if applicable):  Radiology interpretation: Dg Chest Port 1 View  Result Date: 05/13/2018 CLINICAL DATA:  Check port placement EXAM: PORTABLE CHEST 1 VIEW COMPARISON:  None. FINDINGS: Cardiac shadows within normal limits. Right chest wall port is noted with catheter tip in  the mid superior vena cava. No pneumothorax is seen. No bony abnormality is noted. IMPRESSION: No pneumothorax following port placement. Electronically Signed   By: Inez Catalina M.D.   On: 05/13/2018 08:30   Dg Fluoro Guide Cv Line-no Report  Result Date: 05/13/2018 Fluoroscopy was utilized by the requesting physician.  No radiographic interpretation.

## 2018-05-27 NOTE — Patient Instructions (Signed)
[facility] Discharge Instructions for Patients Receiving Chemotherapy  Today you received the following chemotherapy agents:  Herceptin (trastuzumab), Taxol (paclitaxel)  To help prevent nausea and vomiting after your treatment, we encourage you to take your nausea medication as prescribed.   If you develop nausea and vomiting that is not controlled by your nausea medication, call the clinic.   BELOW ARE SYMPTOMS THAT SHOULD BE REPORTED IMMEDIATELY:  *FEVER GREATER THAN 100.5 F  *CHILLS WITH OR WITHOUT FEVER  NAUSEA AND VOMITING THAT IS NOT CONTROLLED WITH YOUR NAUSEA MEDICATION  *UNUSUAL SHORTNESS OF BREATH  *UNUSUAL BRUISING OR BLEEDING  TENDERNESS IN MOUTH AND THROAT WITH OR WITHOUT PRESENCE OF ULCERS  *URINARY PROBLEMS  *BOWEL PROBLEMS  UNUSUAL RASH Items with * indicate a potential emergency and should be followed up as soon as possible.  Feel free to call the clinic should you have any questions or concerns. The clinic phone number is [phone].  Please show the [facility] at check-in to the Emergency Department and triage nurse.

## 2018-05-27 NOTE — Telephone Encounter (Signed)
Returning pt's phone calls reporting that [facility] did not receive magic mouthwash prescription.  Scrip sent in again by fax by PA Lucianne Lei.  Pt VU.

## 2018-06-02 NOTE — Progress Notes (Signed)
Patient Care Team: [name], MD as PCP - General (Family Medicine)  DIAGNOSIS:    ICD-10-CM   1. Malignant neoplasm of lower-outer quadrant of right breast of female, estrogen receptor positive ([place]) C50.511    Z17.0     SUMMARY OF ONCOLOGIC HISTORY:   Malignant neoplasm of lower-outer quadrant of right breast of female, estrogen receptor positive ([place])   03/06/2018 Initial Diagnosis    Screening mammogram detected right breast calcifications, 8 mm mass detected by ultrasound, axilla negative, biopsy revealed grade 2 IDC with high-grade DCIS, ER 100%, PR 1%, Ki-67 30%, HER-2 +3+ by IHC, T1BN0 stage Ia    03/19/2018 Cancer Staging    Staging form: Breast, AJCC 8th Edition - Clinical stage from 03/19/2018: Stage IA (cT1b, cN0, cM0, G2, ER+, PR+, HER2+) - Signed by Nicholas Lose, MD on 03/19/2018    03/24/2018 Surgery    Right lumpectomy [name]): IDC with DCIS, grade 3, 0.6cm, ER+ (100%), PR+ (1%), HER2+ (3+), Ki67 30%, clear margins, 0.4 SLN negative for carcinoma.     05/14/2018 -  Chemotherapy    The patient had trastuzumab (HERCEPTIN) 300 mg in sodium chloride 0.9 % 250 mL chemo infusion, 273 mg, Intravenous,  Once, 1 of 16 cycles Administration: 300 mg (05/14/2018), 150 mg (05/20/2018), 147 mg (05/27/2018) PACLitaxel (TAXOL) 138 mg in sodium chloride 0.9 % 250 mL chemo infusion (</= 29m/m2), 80 mg/m2 = 138 mg, Intravenous,  Once, 1 of 3 cycles Dose modification: 65 mg/m2 (original dose 80 mg/m2, Cycle 2, Reason: Dose not tolerated) Administration: 138 mg (05/14/2018), 138 mg (05/20/2018), 114 mg (05/27/2018)  for chemotherapy treatment.      CHIEF COMPLIANT: Cycle 4 Taxol Herceptin  INTERVAL HISTORY: RMaddox Bratcheris a 58y.o. with above-mentioned history of right breast cancer who underwent a lumpectomy and is currently on adjuvant chemotherapy with weekly Taxol and Herceptin. On 05/27/18 she presented to the symptom management clinic for mouth sores.  The mouth sores  resolved with ice.  She presents to the clinic today for cycle 4.   REVIEW OF SYSTEMS:   Constitutional: Denies fevers, chills or abnormal weight loss, hair loss Eyes: Denies blurriness of vision Ears, nose, mouth, throat, and face: Mouth sores resolved with ice, nosebleeds intermittently Respiratory: Denies cough, dyspnea or wheezes Cardiovascular: Denies palpitation, chest discomfort Gastrointestinal: Denies nausea, heartburn or change in bowel habits Skin: Denies abnormal skin rashes Lymphatics: Denies new lymphadenopathy or easy bruising Neurological: Denies numbness, tingling or new weaknesses Behavioral/Psych: Mood is stable, no new changes  Extremities: No lower extremity edema Breast: denies any pain or lumps or nodules in either breasts All other systems were reviewed with the patient and are negative.  I have reviewed the past medical history, past surgical history, social history and family history with the patient and they are unchanged from previous note.  ALLERGIES:  has No Known Allergies.  MEDICATIONS:  Current Outpatient Medications  Medication Sig Dispense Refill  . albuterol (PROVENTIL HFA;VENTOLIN HFA) 108 (90 Base) MCG/ACT inhaler Inhale into the lungs every 6 (six) hours as needed for wheezing or shortness of breath.    .Marland Kitchenatenolol-chlorthalidone (TENORETIC) 100-25 MG tablet Take 1 tablet by mouth daily.    . clonazePAM (KLONOPIN) 0.5 MG tablet Take 0.5 mg by mouth at bedtime.    . lidocaine (XYLOCAINE) 2 % solution Use as directed 5 mLs in the mouth or throat every 3 (three) hours as needed for mouth pain. Swish and spit 200 mL 2  . lidocaine-prilocaine (  EMLA) cream Apply to affected area once 30 g 3  . magic mouthwash SOLN Take 5 mLs by mouth 4 (four) times daily as needed for mouth pain. 240 mL 3  . Melatonin 10 MG TABS Take by mouth.    [name] omeprazole (PRILOSEC) 20 MG capsule Take 20 mg by mouth daily.    . ondansetron (ZOFRAN) 8 MG tablet Take 1 tablet (8 mg  total) by mouth 2 (two) times daily as needed (Nausea or vomiting). 30 tablet 1  . Oral Wound Care Products [facility]) LIQD Use as directed 5 mLs in the mouth or throat 6 (six) times daily. 240 mL 3  . potassium chloride SA (K-DUR,KLOR-CON) 20 MEQ tablet Take 20 mEq by mouth 2 (two) times daily.    . prochlorperazine (COMPAZINE) 10 MG tablet Take 1 tablet (10 mg total) by mouth every 6 (six) hours as needed (Nausea or vomiting). 30 tablet 1   No current facility-administered medications for this visit.     PHYSICAL EXAMINATION: ECOG PERFORMANCE STATUS: 1 - Symptomatic but completely ambulatory  Vitals:   06/03/18 1016  BP: 118/68  Pulse: 72  Resp: 18  Temp: 98.2 F (36.8 C)  SpO2: 99%   Filed Weights   06/03/18 1016  Weight: 155 lb 11.2 oz (70.6 kg)    GENERAL: alert, no distress and comfortable SKIN: skin color, texture, turgor are normal, no rashes or significant lesions EYES: normal, Conjunctiva are pink and non-injected, sclera clear OROPHARYNX: no exudate, no erythema and lips, buccal mucosa, and tongue normal  NECK: supple, thyroid normal size, non-tender, without nodularity LYMPH: no palpable lymphadenopathy in the cervical, axillary or inguinal LUNGS: clear to auscultation and percussion with normal breathing effort HEART: regular rate & rhythm and no murmurs and no lower extremity edema ABDOMEN: abdomen soft, non-tender and normal bowel sounds MUSCULOSKELETAL: no cyanosis of digits and no clubbing  NEURO: alert & oriented x 3 with fluent speech, no focal motor/sensory deficits EXTREMITIES: No lower extremity edema  LABORATORY DATA:  I have reviewed the data as listed CMP Latest Ref Rng & Units 05/27/2018 05/20/2018 05/14/2018  Glucose 70 - 99 mg/dL 104(H) 102(H) 107(H)  BUN 6 - 20 mg/dL '16 15 17  ' Creatinine 0.44 - 1.00 mg/dL 0.86 0.79 0.82  Sodium 135 - 145 mmol/L 139 138 139  Potassium 3.5 - 5.1 mmol/L 3.6 3.7 3.6  Chloride 98 - 111 mmol/L 101 102 103  CO2 22 - 32  mmol/L '29 27 29  ' Calcium 8.9 - 10.3 mg/dL 10.1 9.8 9.1  Total Protein 6.5 - 8.1 g/dL 7.6 7.2 6.9  Total Bilirubin 0.3 - 1.2 mg/dL 0.6 0.7 0.4  Alkaline Phos 38 - 126 U/L 87 81 74  AST 15 - 41 U/L 35 42(H) 29  ALT 0 - 44 U/L 48(H) 52(H) 34    Lab Results  Component Value Date   WBC 4.1 06/03/2018   HGB 11.9 (L) 06/03/2018   HCT 34.6 (L) 06/03/2018   MCV 90.3 06/03/2018   PLT 250 06/03/2018   NEUTROABS 2.0 06/03/2018    ASSESSMENT & PLAN:  Malignant neoplasm of lower-outer quadrant of right breast of female, estrogen receptor positive ([place]) 03/06/2018:Screening mammogram detected right breast calcifications, 8 mm mass detected by ultrasound, axilla negative, biopsy revealed grade 2 IDC with high-grade DCIS, ER 100%, PR 1%, Ki-67 30%, HER-2 +3+ by IHC, T1BN0 stage Ia  3.23.20: Rt Lumpectomy Grade 2 IDC 0.6 cm, 0/4 LN Neg, Er 100%, PR 1%, Her 2: 3+ pos, T1bN0  Stage 1A  Treatment plan: 1. Adj Chemo with Taxol-Herceptin weekly X 12 followed by Herceptin q 3 weeks for a year. 2. Adj RT 3. Foll by Adj Anti estrogen therapy. ------------------------------------------------------------------------------------------------------------------------------------------- Current treatment: Cycle 4 day 1 Taxol Herceptin Labs reviewed Echocardiogram: EF 60 to 65%  Chemo toxicities: 1.  Intermittent headaches which are mild 2.  Mouth sores that resolved with ice 3.  Hair thinning  Tolerated the treatment extremely well.  Previous problems with carpal tunnel syndrome.  Return to clinic weekly for Taxol every other week for follow-up with me.    No orders of the defined types were placed in this encounter.  The patient has a good understanding of the overall plan. she agrees with it. she will call with any problems that may develop before the next visit here.  Nicholas Lose, MD 06/03/2018  Julious Oka Dorshimer am acting as scribe for Dr. Nicholas Lose.  I have reviewed the above  documentation for accuracy and completeness, and I agree with the above.

## 2018-06-03 ENCOUNTER — Other Ambulatory Visit: Payer: Self-pay

## 2018-06-03 ENCOUNTER — Inpatient Hospital Stay: Payer: Managed Care, Other (non HMO) | Attending: Hematology and Oncology | Admitting: Hematology and Oncology

## 2018-06-03 ENCOUNTER — Inpatient Hospital Stay: Payer: Managed Care, Other (non HMO)

## 2018-06-03 ENCOUNTER — Encounter: Payer: Self-pay | Admitting: *Deleted

## 2018-06-03 DIAGNOSIS — Z7951 Long term (current) use of inhaled steroids: Secondary | ICD-10-CM | POA: Diagnosis not present

## 2018-06-03 DIAGNOSIS — C50511 Malignant neoplasm of lower-outer quadrant of right female breast: Secondary | ICD-10-CM

## 2018-06-03 DIAGNOSIS — G5600 Carpal tunnel syndrome, unspecified upper limb: Secondary | ICD-10-CM | POA: Diagnosis not present

## 2018-06-03 DIAGNOSIS — Z5111 Encounter for antineoplastic chemotherapy: Secondary | ICD-10-CM | POA: Diagnosis not present

## 2018-06-03 DIAGNOSIS — L658 Other specified nonscarring hair loss: Secondary | ICD-10-CM | POA: Diagnosis not present

## 2018-06-03 DIAGNOSIS — T451X5A Adverse effect of antineoplastic and immunosuppressive drugs, initial encounter: Secondary | ICD-10-CM | POA: Diagnosis not present

## 2018-06-03 DIAGNOSIS — Z170 Estrogen receptor positive status [ER+]: Secondary | ICD-10-CM

## 2018-06-03 DIAGNOSIS — Z5112 Encounter for antineoplastic immunotherapy: Secondary | ICD-10-CM | POA: Diagnosis not present

## 2018-06-03 DIAGNOSIS — D6481 Anemia due to antineoplastic chemotherapy: Secondary | ICD-10-CM | POA: Diagnosis not present

## 2018-06-03 DIAGNOSIS — Z79899 Other long term (current) drug therapy: Secondary | ICD-10-CM | POA: Diagnosis not present

## 2018-06-03 DIAGNOSIS — K1379 Other lesions of oral mucosa: Secondary | ICD-10-CM | POA: Insufficient documentation

## 2018-06-03 DIAGNOSIS — R51 Headache: Secondary | ICD-10-CM

## 2018-06-03 DIAGNOSIS — G629 Polyneuropathy, unspecified: Secondary | ICD-10-CM | POA: Diagnosis not present

## 2018-06-03 DIAGNOSIS — Z95828 Presence of other vascular implants and grafts: Secondary | ICD-10-CM

## 2018-06-03 LAB — CBC WITH DIFFERENTIAL (CANCER CENTER ONLY)
Abs Immature Granulocytes: 0.02 10*3/uL (ref 0.00–0.07)
Basophils Absolute: 0 10*3/uL (ref 0.0–0.1)
Basophils Relative: 1 %
Eosinophils Absolute: 0.1 10*3/uL (ref 0.0–0.5)
Eosinophils Relative: 3 %
HCT: 34.6 % — ABNORMAL LOW (ref 36.0–46.0)
Hemoglobin: 11.9 g/dL — ABNORMAL LOW (ref 12.0–15.0)
Immature Granulocytes: 1 %
Lymphocytes Relative: 41 %
Lymphs Abs: 1.7 10*3/uL (ref 0.7–4.0)
MCH: 31.1 pg (ref 26.0–34.0)
MCHC: 34.4 g/dL (ref 30.0–36.0)
MCV: 90.3 fL (ref 80.0–100.0)
Monocytes Absolute: 0.3 10*3/uL (ref 0.1–1.0)
Monocytes Relative: 8 %
Neutro Abs: 2 10*3/uL (ref 1.7–7.7)
Neutrophils Relative %: 46 %
Platelet Count: 250 10*3/uL (ref 150–400)
RBC: 3.83 MIL/uL — ABNORMAL LOW (ref 3.87–5.11)
RDW: 12.4 % (ref 11.5–15.5)
WBC Count: 4.1 10*3/uL (ref 4.0–10.5)
nRBC: 0 % (ref 0.0–0.2)

## 2018-06-03 LAB — CMP (CANCER CENTER ONLY)
ALT: 34 U/L (ref 0–44)
AST: 29 U/L (ref 15–41)
Albumin: 3.8 g/dL (ref 3.5–5.0)
Alkaline Phosphatase: 83 U/L (ref 38–126)
Anion gap: 9 (ref 5–15)
BUN: 11 mg/dL (ref 6–20)
CO2: 27 mmol/L (ref 22–32)
Calcium: 9.4 mg/dL (ref 8.9–10.3)
Chloride: 102 mmol/L (ref 98–111)
Creatinine: 0.76 mg/dL (ref 0.44–1.00)
GFR, Est AFR Am: >60 mL/min (ref >60)
GFR, Estimated: >60 mL/min (ref >60)
Glucose, Bld: 106 mg/dL — ABNORMAL HIGH (ref 70–99)
Potassium: 3.6 mmol/L (ref 3.5–5.1)
Sodium: 138 mmol/L (ref 135–145)
Total Bilirubin: 0.4 mg/dL (ref 0.3–1.2)
Total Protein: 7 g/dL (ref 6.5–8.1)

## 2018-06-03 MED ORDER — SODIUM CHLORIDE 0.9 % IV SOLN
Freq: Once | INTRAVENOUS | Status: AC
  Administered 2018-06-03: 12:00:00 via INTRAVENOUS
  Filled 2018-06-03: qty 250

## 2018-06-03 MED ORDER — SODIUM CHLORIDE 0.9% FLUSH
10.0000 mL | INTRAVENOUS | Status: DC | PRN
  Administered 2018-06-03: 10 mL
  Filled 2018-06-03: qty 10

## 2018-06-03 MED ORDER — ACETAMINOPHEN 325 MG PO TABS
650.0000 mg | ORAL_TABLET | Freq: Once | ORAL | Status: AC
  Administered 2018-06-03: 650 mg via ORAL

## 2018-06-03 MED ORDER — DIPHENHYDRAMINE HCL 50 MG/ML IJ SOLN
50.0000 mg | Freq: Once | INTRAMUSCULAR | Status: AC
  Administered 2018-06-03: 50 mg via INTRAVENOUS

## 2018-06-03 MED ORDER — FAMOTIDINE IN NACL 20-0.9 MG/50ML-% IV SOLN
20.0000 mg | Freq: Once | INTRAVENOUS | Status: AC
  Administered 2018-06-03: 20 mg via INTRAVENOUS

## 2018-06-03 MED ORDER — HEPARIN SOD (PORK) LOCK FLUSH 100 UNIT/ML IV SOLN
500.0000 [IU] | Freq: Once | INTRAVENOUS | Status: AC | PRN
  Administered 2018-06-03: 500 [IU]
  Filled 2018-06-03: qty 5

## 2018-06-03 MED ORDER — DIPHENHYDRAMINE HCL 50 MG/ML IJ SOLN
INTRAMUSCULAR | Status: AC
  Filled 2018-06-03: qty 1

## 2018-06-03 MED ORDER — SODIUM CHLORIDE 0.9 % IV SOLN
65.0000 mg/m2 | Freq: Once | INTRAVENOUS | Status: AC
  Administered 2018-06-03: 114 mg via INTRAVENOUS
  Filled 2018-06-03: qty 19

## 2018-06-03 MED ORDER — FAMOTIDINE IN NACL 20-0.9 MG/50ML-% IV SOLN
INTRAVENOUS | Status: AC
  Filled 2018-06-03: qty 50

## 2018-06-03 MED ORDER — ACETAMINOPHEN 325 MG PO TABS
ORAL_TABLET | ORAL | Status: AC
  Filled 2018-06-03: qty 2

## 2018-06-03 MED ORDER — TRASTUZUMAB CHEMO 150 MG IV SOLR
2.0000 mg/kg | Freq: Once | INTRAVENOUS | Status: AC
  Administered 2018-06-03: 147 mg via INTRAVENOUS
  Filled 2018-06-03: qty 7

## 2018-06-03 NOTE — Patient Instructions (Signed)

## 2018-06-03 NOTE — Patient Instructions (Signed)
[facility] Discharge Instructions for Patients Receiving Chemotherapy  Today you received the following chemotherapy agents:  Herceptin and Taxol.  To help prevent nausea and vomiting after your treatment, we encourage you to take your nausea medication as directed.   If you develop nausea and vomiting that is not controlled by your nausea medication, call the clinic.   BELOW ARE SYMPTOMS THAT SHOULD BE REPORTED IMMEDIATELY:  *FEVER GREATER THAN 100.5 F  *CHILLS WITH OR WITHOUT FEVER  NAUSEA AND VOMITING THAT IS NOT CONTROLLED WITH YOUR NAUSEA MEDICATION  *UNUSUAL SHORTNESS OF BREATH  *UNUSUAL BRUISING OR BLEEDING  TENDERNESS IN MOUTH AND THROAT WITH OR WITHOUT PRESENCE OF ULCERS  *URINARY PROBLEMS  *BOWEL PROBLEMS  UNUSUAL RASH Items with * indicate a potential emergency and should be followed up as soon as possible.  Feel free to call the clinic should you have any questions or concerns. The clinic phone number is [phone].  Please show the CHEMO ALERT CARD at check-in to the Emergency Department and triage nurse.   

## 2018-06-04 ENCOUNTER — Telehealth: Payer: Self-pay | Admitting: Hematology and Oncology

## 2018-06-04 ENCOUNTER — Telehealth: Payer: Self-pay | Admitting: *Deleted

## 2018-06-04 NOTE — Telephone Encounter (Signed)
Talk with patient regarding schedule °

## 2018-06-10 ENCOUNTER — Inpatient Hospital Stay: Payer: Managed Care, Other (non HMO)

## 2018-06-10 ENCOUNTER — Other Ambulatory Visit: Payer: Self-pay

## 2018-06-10 VITALS — BP 125/74 | HR 69 | Temp 99.0°F | Resp 19 | Ht 61.0 in | Wt 155.8 lb

## 2018-06-10 DIAGNOSIS — Z95828 Presence of other vascular implants and grafts: Secondary | ICD-10-CM

## 2018-06-10 DIAGNOSIS — C50511 Malignant neoplasm of lower-outer quadrant of right female breast: Secondary | ICD-10-CM

## 2018-06-10 DIAGNOSIS — Z5112 Encounter for antineoplastic immunotherapy: Secondary | ICD-10-CM | POA: Diagnosis not present

## 2018-06-10 LAB — CBC WITH DIFFERENTIAL (CANCER CENTER ONLY)
Abs Immature Granulocytes: 0.05 10*3/uL (ref 0.00–0.07)
Basophils Absolute: 0.1 10*3/uL (ref 0.0–0.1)
Basophils Relative: 1 %
Eosinophils Absolute: 0.1 10*3/uL (ref 0.0–0.5)
Eosinophils Relative: 2 %
HCT: 33.8 % — ABNORMAL LOW (ref 36.0–46.0)
Hemoglobin: 11.5 g/dL — ABNORMAL LOW (ref 12.0–15.0)
Immature Granulocytes: 1 %
Lymphocytes Relative: 36 %
Lymphs Abs: 1.6 10*3/uL (ref 0.7–4.0)
MCH: 31.2 pg (ref 26.0–34.0)
MCHC: 34 g/dL (ref 30.0–36.0)
MCV: 91.6 fL (ref 80.0–100.0)
Monocytes Absolute: 0.4 10*3/uL (ref 0.1–1.0)
Monocytes Relative: 9 %
Neutro Abs: 2.3 10*3/uL (ref 1.7–7.7)
Neutrophils Relative %: 51 %
Platelet Count: 242 10*3/uL (ref 150–400)
RBC: 3.69 MIL/uL — ABNORMAL LOW (ref 3.87–5.11)
RDW: 13 % (ref 11.5–15.5)
WBC Count: 4.5 10*3/uL (ref 4.0–10.5)
nRBC: 0 % (ref 0.0–0.2)

## 2018-06-10 LAB — CMP (CANCER CENTER ONLY)
ALT: 36 U/L (ref 0–44)
AST: 32 U/L (ref 15–41)
Albumin: 4 g/dL (ref 3.5–5.0)
Alkaline Phosphatase: 75 U/L (ref 38–126)
Anion gap: 8 (ref 5–15)
BUN: 13 mg/dL (ref 6–20)
CO2: 27 mmol/L (ref 22–32)
Calcium: 9.6 mg/dL (ref 8.9–10.3)
Chloride: 103 mmol/L (ref 98–111)
Creatinine: 0.77 mg/dL (ref 0.44–1.00)
GFR, Est AFR Am: >60 mL/min (ref >60)
GFR, Estimated: >60 mL/min (ref >60)
Glucose, Bld: 98 mg/dL (ref 70–99)
Potassium: 3.9 mmol/L (ref 3.5–5.1)
Sodium: 138 mmol/L (ref 135–145)
Total Bilirubin: 0.5 mg/dL (ref 0.3–1.2)
Total Protein: 7 g/dL (ref 6.5–8.1)

## 2018-06-10 MED ORDER — SODIUM CHLORIDE 0.9% FLUSH
10.0000 mL | INTRAVENOUS | Status: DC | PRN
  Administered 2018-06-10: 09:00:00 10 mL
  Filled 2018-06-10: qty 10

## 2018-06-10 MED ORDER — SODIUM CHLORIDE 0.9 % IV SOLN
65.0000 mg/m2 | Freq: Once | INTRAVENOUS | Status: AC
  Administered 2018-06-10: 12:00:00 114 mg via INTRAVENOUS
  Filled 2018-06-10: qty 19

## 2018-06-10 MED ORDER — FAMOTIDINE IN NACL 20-0.9 MG/50ML-% IV SOLN
20.0000 mg | Freq: Once | INTRAVENOUS | Status: AC
  Administered 2018-06-10: 11:00:00 20 mg via INTRAVENOUS

## 2018-06-10 MED ORDER — DIPHENHYDRAMINE HCL 50 MG/ML IJ SOLN
50.0000 mg | Freq: Once | INTRAMUSCULAR | Status: AC
  Administered 2018-06-10: 50 mg via INTRAVENOUS

## 2018-06-10 MED ORDER — DIPHENHYDRAMINE HCL 50 MG/ML IJ SOLN
INTRAMUSCULAR | Status: AC
  Filled 2018-06-10: qty 1

## 2018-06-10 MED ORDER — FAMOTIDINE IN NACL 20-0.9 MG/50ML-% IV SOLN
INTRAVENOUS | Status: AC
  Filled 2018-06-10: qty 50

## 2018-06-10 MED ORDER — TRASTUZUMAB CHEMO 150 MG IV SOLR
150.0000 mg | Freq: Once | INTRAVENOUS | Status: AC
  Administered 2018-06-10: 11:00:00 150 mg via INTRAVENOUS
  Filled 2018-06-10: qty 7.14

## 2018-06-10 MED ORDER — ACETAMINOPHEN 325 MG PO TABS
ORAL_TABLET | ORAL | Status: AC
  Filled 2018-06-10: qty 2

## 2018-06-10 MED ORDER — HEPARIN SOD (PORK) LOCK FLUSH 100 UNIT/ML IV SOLN
500.0000 [IU] | Freq: Once | INTRAVENOUS | Status: AC | PRN
  Administered 2018-06-10: 500 [IU]
  Filled 2018-06-10: qty 5

## 2018-06-10 MED ORDER — SODIUM CHLORIDE 0.9 % IV SOLN
Freq: Once | INTRAVENOUS | Status: AC
  Administered 2018-06-10: 10:00:00 via INTRAVENOUS
  Filled 2018-06-10: qty 250

## 2018-06-10 MED ORDER — ACETAMINOPHEN 325 MG PO TABS
650.0000 mg | ORAL_TABLET | Freq: Once | ORAL | Status: AC
  Administered 2018-06-10: 11:00:00 650 mg via ORAL

## 2018-06-10 MED ORDER — SODIUM CHLORIDE 0.9% FLUSH
10.0000 mL | INTRAVENOUS | Status: DC | PRN
  Administered 2018-06-10: 13:00:00 10 mL
  Filled 2018-06-10: qty 10

## 2018-06-10 NOTE — Assessment & Plan Note (Signed)
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 Adj Chemo with Taxol-Herceptin weekly X 12 followed by Herceptin q 3 weeks for a year. 2. Adj RT 3. Foll by Adj Anti estrogen therapy. ------------------------------------------------------------------------------------------------------------------------------------------- Current treatment: Cycle6day 1 Taxol Herceptin Labs reviewed Echocardiogram: EF 60 to 65%  Chemotoxicities: 1.Intermittent headaches which are mild 2.  Mouth sores that resolved with ice 3.  Hair thinning  Tolerated the treatment extremely well.  Previous problems with carpal tunnel syndrome.  Return to clinicweekly for Taxol every other week for follow-up with me.

## 2018-06-10 NOTE — Patient Instructions (Signed)
[facility] Discharge Instructions for Patients Receiving Chemotherapy  Today you received the following chemotherapy agents:  Herceptin and Taxol.  To help prevent nausea and vomiting after your treatment, we encourage you to take your nausea medication as directed.   If you develop nausea and vomiting that is not controlled by your nausea medication, call the clinic.   BELOW ARE SYMPTOMS THAT SHOULD BE REPORTED IMMEDIATELY:  *FEVER GREATER THAN 100.5 F  *CHILLS WITH OR WITHOUT FEVER  NAUSEA AND VOMITING THAT IS NOT CONTROLLED WITH YOUR NAUSEA MEDICATION  *UNUSUAL SHORTNESS OF BREATH  *UNUSUAL BRUISING OR BLEEDING  TENDERNESS IN MOUTH AND THROAT WITH OR WITHOUT PRESENCE OF ULCERS  *URINARY PROBLEMS  *BOWEL PROBLEMS  UNUSUAL RASH Items with * indicate a potential emergency and should be followed up as soon as possible.  Feel free to call the clinic should you have any questions or concerns. The clinic phone number is [phone].  Please show the CHEMO ALERT CARD at check-in to the Emergency Department and triage nurse.   

## 2018-06-16 NOTE — Progress Notes (Signed)
Patient Care Team: [name], MD as PCP - General (Family Medicine)  DIAGNOSIS:    ICD-10-CM   1. 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    05/14/2018 -  Chemotherapy   The patient had trastuzumab (HERCEPTIN) 300 mg in sodium chloride 0.9 % 250 mL chemo infusion, 273 mg, Intravenous,  Once, 2 of 16 cycles Administration: 300 mg (05/14/2018), 150 mg (05/20/2018), 150 mg (06/10/2018), 147 mg (05/27/2018), 147 mg (06/03/2018) PACLitaxel (TAXOL) 138 mg in sodium chloride 0.9 % 250 mL chemo infusion (</= 80m/m2), 80 mg/m2 = 138 mg, Intravenous,  Once, 2 of 3 cycles Dose modification: 65 mg/m2 (original dose 80 mg/m2, Cycle 2, Reason: Dose not tolerated) Administration: 138 mg (05/14/2018), 138 mg (05/20/2018), 114 mg (06/10/2018), 114 mg (05/27/2018), 114 mg (06/03/2018)  for chemotherapy treatment.      CHIEF COMPLIANT: Cycle 6 Taxol Herceptin  INTERVAL HISTORY: Kim Montgomery a 58y.o. with above-mentioned history of right breast cancer who underwent a lumpectomy andis currently onadjuvant chemotherapy with weekly Taxol and Herceptin.She  presents to the clinic today for cycle 6.   REVIEW OF SYSTEMS:   Constitutional: Denies fevers, chills or abnormal weight loss Eyes: Denies blurriness of vision Ears, nose, mouth, throat, and face: Denies mucositis or sore throat Respiratory: Denies cough, dyspnea or wheezes Cardiovascular: Denies palpitation, chest discomfort Gastrointestinal: Denies nausea, heartburn or change in bowel habits Skin: Denies abnormal skin rashes Lymphatics: Denies new lymphadenopathy or easy bruising Neurological: Denies numbness, tingling or new weaknesses Behavioral/Psych: Mood is stable, no new changes  Extremities: No lower extremity edema Breast: denies any pain or lumps or nodules in either breasts All other systems were reviewed with the patient and are negative.  I have reviewed the past medical history, past surgical history, social history and family history with the patient and they are unchanged from previous note.  ALLERGIES:  has No Known Allergies.  MEDICATIONS:  Current Outpatient Medications  Medication Sig Dispense Refill  . albuterol (PROVENTIL HFA;VENTOLIN HFA) 108 (90 Base) MCG/ACT inhaler Inhale into the lungs every 6 (six) hours as needed for wheezing or shortness of breath.    .Marland Kitchenatenolol-chlorthalidone (TENORETIC) 100-25 MG tablet Take 1 tablet by mouth daily.    . clonazePAM (KLONOPIN) 0.5 MG tablet Take 0.5 mg by mouth at bedtime.    . lidocaine (XYLOCAINE) 2 % solution Use as directed 5 mLs in the mouth or throat every 3 (three) hours as needed for mouth pain. Swish and spit 200 mL 2  . lidocaine-prilocaine (EMLA) cream Apply to affected area once 30 g 3  . magic mouthwash SOLN Take 5 mLs by mouth 4 (  four) times daily as needed for mouth pain. 240 mL 3  . Melatonin 10 MG TABS Take by mouth.    [name] omeprazole (PRILOSEC) 20 MG capsule Take 20 mg by mouth daily.    . ondansetron (ZOFRAN) 8 MG tablet Take 1 tablet (8 mg total) by mouth 2 (two) times daily as needed (Nausea or  vomiting). 30 tablet 1  . Oral Wound Care Products [facility]) LIQD Use as directed 5 mLs in the mouth or throat 6 (six) times daily. 240 mL 3  . potassium chloride SA (K-DUR,KLOR-CON) 20 MEQ tablet Take 20 mEq by mouth 2 (two) times daily.    . prochlorperazine (COMPAZINE) 10 MG tablet Take 1 tablet (10 mg total) by mouth every 6 (six) hours as needed (Nausea or vomiting). 30 tablet 1   No current facility-administered medications for this visit.     PHYSICAL EXAMINATION: ECOG PERFORMANCE STATUS: 1 - Symptomatic but completely ambulatory  Vitals:   06/17/18 1342  BP: 128/60  Pulse: 77  Resp: 18  Temp: 99.1 F (37.3 C)   Filed Weights   06/17/18 1342  Weight: 155 lb 9.6 oz (70.6 kg)    Physical exam not done due to COVID-19 precautions LABORATORY DATA:  I have reviewed the data as listed CMP Latest Ref Rng & Units 06/10/2018 06/03/2018 05/27/2018  Glucose 70 - 99 mg/dL 98 106(H) 104(H)  BUN 6 - 20 mg/dL _0 Creatinine 0.44 - 1.00 mg/dL 0.77 0.76 0.86  Sodium 135 - 145 mmol/L 138 138 139  Potassium 3.5 - 5.1 mmol/L 3.9 3.6 3.6  Chloride 98 - 111 mmol/L 103 102 101  CO2 22 - 32 mmol/L _1 Calcium 8.9 - 10.3 mg/dL 9.6 9.4 10.1  Total Protein 6.5 - 8.1 g/dL 7.0 7.0 7.6  Total Bilirubin 0.3 - 1.2 mg/dL 0.5 0.4 0.6  Alkaline Phos 38 - 126 U/L 75 83 87  AST 15 - 41 U/L 32 29 35  ALT 0 - 44 U/L 36 34 48(H)    Lab Results  Component Value Date   WBC 5.2 06/17/2018   HGB 10.9 (L) 06/17/2018   HCT 31.5 (L) 06/17/2018   MCV 90.0 06/17/2018   PLT 246 06/17/2018   NEUTROABS 2.7 06/17/2018    ASSESSMENT & PLAN:  Malignant neoplasm of lower-outer quadrant of right breast of female, estrogen receptor positive ([place]) 03/06/2018:Screening mammogram detected right breast calcifications, 8 mm mass detected by ultrasound, axilla negative, 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 Adj Chemo with Taxol-Herceptin weekly X 12 followed by Herceptin q 3 weeks for a year. 2. Adj RT 3. Foll by Adj Anti estrogen therapy. ------------------------------------------------------------------------------------------------------------------------------------------- Current treatment: Cycle6day 1 Taxol Herceptin Labs reviewed Echocardiogram: EF 60 to 65%  Chemotoxicities: 1.Chemo induced anemia: Monitoring 2.  Mouth sores that resolved with ice 3.  Hair thinning Watching very closely for neuropathy.  So far she has not had any symptoms of neuropathy. She is icing her hands. Tolerated the treatment extremely well.  Previous problems with carpal tunnel syndrome. She plans to go to [employer] in October.  Return to clinicweekly for Taxol every other week for follow-up with me.    No orders of the defined types were placed in this encounter.  The patient has a good understanding of the overall plan. she agrees with it. she will call with any problems that may develop before the next visit here.  Nicholas Lose, MD 06/17/2018  Julious Oka Dorshimer am acting as scribe for Dr. Nicholas Lose.  I have reviewed the above documentation for accuracy and completeness, and I agree with the above.

## 2018-06-17 ENCOUNTER — Inpatient Hospital Stay: Payer: Managed Care, Other (non HMO)

## 2018-06-17 ENCOUNTER — Other Ambulatory Visit: Payer: Self-pay

## 2018-06-17 ENCOUNTER — Inpatient Hospital Stay (HOSPITAL_BASED_OUTPATIENT_CLINIC_OR_DEPARTMENT_OTHER): Payer: Managed Care, Other (non HMO) | Admitting: Hematology and Oncology

## 2018-06-17 DIAGNOSIS — Z5112 Encounter for antineoplastic immunotherapy: Secondary | ICD-10-CM | POA: Diagnosis not present

## 2018-06-17 DIAGNOSIS — T451X5A Adverse effect of antineoplastic and immunosuppressive drugs, initial encounter: Secondary | ICD-10-CM

## 2018-06-17 DIAGNOSIS — C50511 Malignant neoplasm of lower-outer quadrant of right female breast: Secondary | ICD-10-CM

## 2018-06-17 DIAGNOSIS — Z170 Estrogen receptor positive status [ER+]: Secondary | ICD-10-CM | POA: Diagnosis not present

## 2018-06-17 DIAGNOSIS — D6481 Anemia due to antineoplastic chemotherapy: Secondary | ICD-10-CM | POA: Diagnosis not present

## 2018-06-17 DIAGNOSIS — Z95828 Presence of other vascular implants and grafts: Secondary | ICD-10-CM

## 2018-06-17 DIAGNOSIS — Z7951 Long term (current) use of inhaled steroids: Secondary | ICD-10-CM

## 2018-06-17 DIAGNOSIS — L658 Other specified nonscarring hair loss: Secondary | ICD-10-CM | POA: Diagnosis not present

## 2018-06-17 DIAGNOSIS — Z79899 Other long term (current) drug therapy: Secondary | ICD-10-CM

## 2018-06-17 DIAGNOSIS — G5600 Carpal tunnel syndrome, unspecified upper limb: Secondary | ICD-10-CM

## 2018-06-17 LAB — CBC WITH DIFFERENTIAL (CANCER CENTER ONLY)
Abs Immature Granulocytes: 0.05 10*3/uL (ref 0.00–0.07)
Basophils Absolute: 0 10*3/uL (ref 0.0–0.1)
Basophils Relative: 1 %
Eosinophils Absolute: 0.2 10*3/uL (ref 0.0–0.5)
Eosinophils Relative: 3 %
HCT: 31.5 % — ABNORMAL LOW (ref 36.0–46.0)
Hemoglobin: 10.9 g/dL — ABNORMAL LOW (ref 12.0–15.0)
Immature Granulocytes: 1 %
Lymphocytes Relative: 37 %
Lymphs Abs: 1.9 10*3/uL (ref 0.7–4.0)
MCH: 31.1 pg (ref 26.0–34.0)
MCHC: 34.6 g/dL (ref 30.0–36.0)
MCV: 90 fL (ref 80.0–100.0)
Monocytes Absolute: 0.4 10*3/uL (ref 0.1–1.0)
Monocytes Relative: 7 %
Neutro Abs: 2.7 10*3/uL (ref 1.7–7.7)
Neutrophils Relative %: 51 %
Platelet Count: 246 10*3/uL (ref 150–400)
RBC: 3.5 MIL/uL — ABNORMAL LOW (ref 3.87–5.11)
RDW: 13.2 % (ref 11.5–15.5)
WBC Count: 5.2 10*3/uL (ref 4.0–10.5)
nRBC: 0 % (ref 0.0–0.2)

## 2018-06-17 LAB — CMP (CANCER CENTER ONLY)
ALT: 39 U/L (ref 0–44)
AST: 34 U/L (ref 15–41)
Albumin: 3.9 g/dL (ref 3.5–5.0)
Alkaline Phosphatase: 78 U/L (ref 38–126)
Anion gap: 10 (ref 5–15)
BUN: 13 mg/dL (ref 6–20)
CO2: 26 mmol/L (ref 22–32)
Calcium: 9.4 mg/dL (ref 8.9–10.3)
Chloride: 102 mmol/L (ref 98–111)
Creatinine: 0.8 mg/dL (ref 0.44–1.00)
GFR, Est AFR Am: >60 mL/min (ref >60)
GFR, Estimated: >60 mL/min (ref >60)
Glucose, Bld: 125 mg/dL — ABNORMAL HIGH (ref 70–99)
Potassium: 3.3 mmol/L — ABNORMAL LOW (ref 3.5–5.1)
Sodium: 138 mmol/L (ref 135–145)
Total Bilirubin: 0.4 mg/dL (ref 0.3–1.2)
Total Protein: 7.3 g/dL (ref 6.5–8.1)

## 2018-06-17 MED ORDER — SODIUM CHLORIDE 0.9% FLUSH
10.0000 mL | INTRAVENOUS | Status: DC | PRN
  Administered 2018-06-17: 10 mL
  Filled 2018-06-17: qty 10

## 2018-06-17 MED ORDER — SODIUM CHLORIDE 0.9 % IV SOLN
65.0000 mg/m2 | Freq: Once | INTRAVENOUS | Status: AC
  Administered 2018-06-17: 114 mg via INTRAVENOUS
  Filled 2018-06-17: qty 19

## 2018-06-17 MED ORDER — TRASTUZUMAB CHEMO 150 MG IV SOLR
150.0000 mg | Freq: Once | INTRAVENOUS | Status: AC
  Administered 2018-06-17: 150 mg via INTRAVENOUS
  Filled 2018-06-17: qty 7.14

## 2018-06-17 MED ORDER — DIPHENHYDRAMINE HCL 25 MG PO CAPS
ORAL_CAPSULE | ORAL | Status: AC
  Filled 2018-06-17: qty 2

## 2018-06-17 MED ORDER — FAMOTIDINE IN NACL 20-0.9 MG/50ML-% IV SOLN
INTRAVENOUS | Status: AC
  Filled 2018-06-17: qty 50

## 2018-06-17 MED ORDER — SODIUM CHLORIDE 0.9 % IV SOLN
Freq: Once | INTRAVENOUS | Status: AC
  Administered 2018-06-17: 14:00:00 via INTRAVENOUS
  Filled 2018-06-17: qty 250

## 2018-06-17 MED ORDER — DIPHENHYDRAMINE HCL 50 MG/ML IJ SOLN
INTRAMUSCULAR | Status: AC
  Filled 2018-06-17: qty 1

## 2018-06-17 MED ORDER — ACETAMINOPHEN 325 MG PO TABS
ORAL_TABLET | ORAL | Status: AC
  Filled 2018-06-17: qty 2

## 2018-06-17 MED ORDER — HEPARIN SOD (PORK) LOCK FLUSH 100 UNIT/ML IV SOLN
500.0000 [IU] | Freq: Once | INTRAVENOUS | Status: AC | PRN
  Administered 2018-06-17: 500 [IU]
  Filled 2018-06-17: qty 5

## 2018-06-17 MED ORDER — ACETAMINOPHEN 325 MG PO TABS
650.0000 mg | ORAL_TABLET | Freq: Once | ORAL | Status: AC
  Administered 2018-06-17: 650 mg via ORAL

## 2018-06-17 MED ORDER — FAMOTIDINE IN NACL 20-0.9 MG/50ML-% IV SOLN
20.0000 mg | Freq: Once | INTRAVENOUS | Status: AC
  Administered 2018-06-17: 20 mg via INTRAVENOUS

## 2018-06-17 MED ORDER — DIPHENHYDRAMINE HCL 50 MG/ML IJ SOLN
50.0000 mg | Freq: Once | INTRAMUSCULAR | Status: AC
  Administered 2018-06-17: 50 mg via INTRAVENOUS

## 2018-06-17 NOTE — Patient Instructions (Signed)
[facility] Discharge Instructions for Patients Receiving Chemotherapy  Today you received the following chemotherapy agents Herceptin, Taxol  To help prevent nausea and vomiting after your treatment, we encourage you to take your nausea medication as directed  If you develop nausea and vomiting that is not controlled by your nausea medication, call the clinic.   BELOW ARE SYMPTOMS THAT SHOULD BE REPORTED IMMEDIATELY:  *FEVER GREATER THAN 100.5 F  *CHILLS WITH OR WITHOUT FEVER  NAUSEA AND VOMITING THAT IS NOT CONTROLLED WITH YOUR NAUSEA MEDICATION  *UNUSUAL SHORTNESS OF BREATH  *UNUSUAL BRUISING OR BLEEDING  TENDERNESS IN MOUTH AND THROAT WITH OR WITHOUT PRESENCE OF ULCERS  *URINARY PROBLEMS  *BOWEL PROBLEMS  UNUSUAL RASH Items with * indicate a potential emergency and should be followed up as soon as possible.  Feel free to call the clinic should you have any questions or concerns. The clinic phone number is [phone].  Please show the CHEMO ALERT CARD at check-in to the Emergency Department and triage nurse.   

## 2018-06-18 ENCOUNTER — Telehealth: Payer: Self-pay | Admitting: *Deleted

## 2018-06-18 NOTE — Telephone Encounter (Signed)
Called pt to assess needs during taxol treatments. Relate doing well, denies questions or needs at this time. Encourage pt to call with concerns. Received verbal understanding.

## 2018-06-24 ENCOUNTER — Other Ambulatory Visit: Payer: Self-pay

## 2018-06-24 ENCOUNTER — Inpatient Hospital Stay: Payer: Managed Care, Other (non HMO)

## 2018-06-24 VITALS — BP 123/75 | HR 72 | Temp 98.2°F | Resp 18 | Wt 155.5 lb

## 2018-06-24 DIAGNOSIS — Z95828 Presence of other vascular implants and grafts: Secondary | ICD-10-CM

## 2018-06-24 DIAGNOSIS — C50511 Malignant neoplasm of lower-outer quadrant of right female breast: Secondary | ICD-10-CM

## 2018-06-24 DIAGNOSIS — Z5112 Encounter for antineoplastic immunotherapy: Secondary | ICD-10-CM | POA: Diagnosis not present

## 2018-06-24 LAB — CBC WITH DIFFERENTIAL (CANCER CENTER ONLY)
Abs Immature Granulocytes: 0.06 10*3/uL (ref 0.00–0.07)
Basophils Absolute: 0 10*3/uL (ref 0.0–0.1)
Basophils Relative: 1 %
Eosinophils Absolute: 0.1 10*3/uL (ref 0.0–0.5)
Eosinophils Relative: 3 %
HCT: 31.3 % — ABNORMAL LOW (ref 36.0–46.0)
Hemoglobin: 10.8 g/dL — ABNORMAL LOW (ref 12.0–15.0)
Immature Granulocytes: 1 %
Lymphocytes Relative: 35 %
Lymphs Abs: 1.8 10*3/uL (ref 0.7–4.0)
MCH: 31.6 pg (ref 26.0–34.0)
MCHC: 34.5 g/dL (ref 30.0–36.0)
MCV: 91.5 fL (ref 80.0–100.0)
Monocytes Absolute: 0.3 10*3/uL (ref 0.1–1.0)
Monocytes Relative: 6 %
Neutro Abs: 2.8 10*3/uL (ref 1.7–7.7)
Neutrophils Relative %: 54 %
Platelet Count: 250 10*3/uL (ref 150–400)
RBC: 3.42 MIL/uL — ABNORMAL LOW (ref 3.87–5.11)
RDW: 13.6 % (ref 11.5–15.5)
WBC Count: 5.2 10*3/uL (ref 4.0–10.5)
nRBC: 0 % (ref 0.0–0.2)

## 2018-06-24 LAB — CMP (CANCER CENTER ONLY)
ALT: 36 U/L (ref 0–44)
AST: 34 U/L (ref 15–41)
Albumin: 3.9 g/dL (ref 3.5–5.0)
Alkaline Phosphatase: 71 U/L (ref 38–126)
Anion gap: 8 (ref 5–15)
BUN: 15 mg/dL (ref 6–20)
CO2: 28 mmol/L (ref 22–32)
Calcium: 9 mg/dL (ref 8.9–10.3)
Chloride: 102 mmol/L (ref 98–111)
Creatinine: 0.8 mg/dL (ref 0.44–1.00)
GFR, Est AFR Am: >60 mL/min (ref >60)
GFR, Estimated: >60 mL/min (ref >60)
Glucose, Bld: 96 mg/dL (ref 70–99)
Potassium: 3.4 mmol/L — ABNORMAL LOW (ref 3.5–5.1)
Sodium: 138 mmol/L (ref 135–145)
Total Bilirubin: 0.5 mg/dL (ref 0.3–1.2)
Total Protein: 7.3 g/dL (ref 6.5–8.1)

## 2018-06-24 LAB — MAGNESIUM: Magnesium: 1.7 mg/dL (ref 1.7–2.4)

## 2018-06-24 MED ORDER — FAMOTIDINE IN NACL 20-0.9 MG/50ML-% IV SOLN
INTRAVENOUS | Status: AC
  Filled 2018-06-24: qty 50

## 2018-06-24 MED ORDER — ACETAMINOPHEN 325 MG PO TABS
ORAL_TABLET | ORAL | Status: AC
  Filled 2018-06-24: qty 2

## 2018-06-24 MED ORDER — ACETAMINOPHEN 325 MG PO TABS
650.0000 mg | ORAL_TABLET | Freq: Once | ORAL | Status: AC
  Administered 2018-06-24: 650 mg via ORAL

## 2018-06-24 MED ORDER — SODIUM CHLORIDE 0.9 % IV SOLN
65.0000 mg/m2 | Freq: Once | INTRAVENOUS | Status: AC
  Administered 2018-06-24: 114 mg via INTRAVENOUS
  Filled 2018-06-24: qty 19

## 2018-06-24 MED ORDER — SODIUM CHLORIDE 0.9 % IV SOLN
Freq: Once | INTRAVENOUS | Status: AC
  Administered 2018-06-24: 13:00:00 via INTRAVENOUS
  Filled 2018-06-24: qty 250

## 2018-06-24 MED ORDER — DIPHENHYDRAMINE HCL 50 MG/ML IJ SOLN
50.0000 mg | Freq: Once | INTRAMUSCULAR | Status: AC
  Administered 2018-06-24: 50 mg via INTRAVENOUS

## 2018-06-24 MED ORDER — DIPHENHYDRAMINE HCL 50 MG/ML IJ SOLN
INTRAMUSCULAR | Status: AC
  Filled 2018-06-24: qty 1

## 2018-06-24 MED ORDER — HEPARIN SOD (PORK) LOCK FLUSH 100 UNIT/ML IV SOLN
500.0000 [IU] | Freq: Once | INTRAVENOUS | Status: AC | PRN
  Administered 2018-06-24: 500 [IU]
  Filled 2018-06-24: qty 5

## 2018-06-24 MED ORDER — FAMOTIDINE IN NACL 20-0.9 MG/50ML-% IV SOLN
20.0000 mg | Freq: Once | INTRAVENOUS | Status: AC
  Administered 2018-06-24: 20 mg via INTRAVENOUS

## 2018-06-24 MED ORDER — SODIUM CHLORIDE 0.9% FLUSH
10.0000 mL | INTRAVENOUS | Status: DC | PRN
  Administered 2018-06-24: 10 mL
  Filled 2018-06-24: qty 10

## 2018-06-24 MED ORDER — TRASTUZUMAB CHEMO 150 MG IV SOLR
150.0000 mg | Freq: Once | INTRAVENOUS | Status: AC
  Administered 2018-06-24: 150 mg via INTRAVENOUS
  Filled 2018-06-24: qty 7.14

## 2018-06-24 NOTE — Patient Instructions (Signed)
[facility] Discharge Instructions for Patients Receiving Chemotherapy  Today you received the following chemotherapy agents Herceptin, Taxol  To help prevent nausea and vomiting after your treatment, we encourage you to take your nausea medication as directed  If you develop nausea and vomiting that is not controlled by your nausea medication, call the clinic.   BELOW ARE SYMPTOMS THAT SHOULD BE REPORTED IMMEDIATELY:  *FEVER GREATER THAN 100.5 F  *CHILLS WITH OR WITHOUT FEVER  NAUSEA AND VOMITING THAT IS NOT CONTROLLED WITH YOUR NAUSEA MEDICATION  *UNUSUAL SHORTNESS OF BREATH  *UNUSUAL BRUISING OR BLEEDING  TENDERNESS IN MOUTH AND THROAT WITH OR WITHOUT PRESENCE OF ULCERS  *URINARY PROBLEMS  *BOWEL PROBLEMS  UNUSUAL RASH Items with * indicate a potential emergency and should be followed up as soon as possible.  Feel free to call the clinic should you have any questions or concerns. The clinic phone number is [phone].  Please show the CHEMO ALERT CARD at check-in to the Emergency Department and triage nurse.   

## 2018-06-25 NOTE — Assessment & Plan Note (Signed)
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 Adj Chemo with Taxol-Herceptin weekly X 12 followed by Herceptin q 3 weeks for a year. 2. Adj RT 3. Foll by Adj Anti estrogen therapy. ------------------------------------------------------------------------------------------------------------------------------------------- Current treatment: Cycle8 Taxol Herceptin Labs reviewed Echocardiogram: EF 60 to 65%  Chemotoxicities: 1.Chemo induced anemia: Monitoring 2.Mouth sores that resolved with ice 3.Hair thinning Watching very closely for neuropathy.  So far she has not had any symptoms of neuropathy. She is icing her hands. Tolerated the treatment extremely well.  Previous problems with carpal tunnel syndrome. She plans to go to [employer] in October.  Return to clinicweekly for Taxol every other week for follow-up with me.

## 2018-06-30 NOTE — Progress Notes (Signed)
Patient Care Team: [name], MD as PCP - General (Family Medicine)  DIAGNOSIS:    ICD-10-CM   1. 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    05/14/2018 -  Chemotherapy   The patient had trastuzumab (HERCEPTIN) 300 mg in sodium chloride 0.9 % 250 mL chemo infusion, 273 mg, Intravenous,  Once, 2 of 16 cycles Administration: 300 mg (05/14/2018), 150 mg (05/20/2018), 150 mg (06/10/2018), 147 mg (05/27/2018), 147 mg (06/03/2018), 150 mg (06/17/2018), 150 mg (06/24/2018) PACLitaxel (TAXOL) 138 mg in sodium chloride 0.9 % 250 mL chemo infusion (</= 30m/m2), 80 mg/m2 = 138 mg, Intravenous,  Once, 2 of 3 cycles Dose modification: 65 mg/m2 (original dose 80 mg/m2, Cycle 2, Reason: Dose not tolerated) Administration: 138 mg (05/14/2018), 138 mg (05/20/2018), 114 mg (06/10/2018), 114 mg (05/27/2018), 114 mg (06/03/2018), 114 mg (06/17/2018), 114 mg (06/24/2018)  for chemotherapy treatment.      CHIEF COMPLIANT: Cycle 8 Taxol Herceptin  INTERVAL HISTORY: RNaira Standifordis a 58y.o. with above-mentioned history of right breast cancer who underwent a  lumpectomyandis currently onadjuvant chemotherapy with weekly Taxol and Herceptin.She presents to the clinic today for cycle 8.   REVIEW OF SYSTEMS:   Constitutional: Denies fevers, chills or abnormal weight loss Eyes: Denies blurriness of vision Ears, nose, mouth, throat, and face: Denies mucositis or sore throat Respiratory: Denies cough, dyspnea or wheezes Cardiovascular: Denies palpitation, chest discomfort Gastrointestinal: Denies nausea, heartburn or change in bowel habits Skin: Denies abnormal skin rashes Lymphatics: Denies new lymphadenopathy or easy bruising Neurological: Denies numbness, tingling or new weaknesses Behavioral/Psych: Mood is stable, no new changes  Extremities: No lower extremity edema Breast: denies any pain or lumps or nodules in either breasts All other systems were reviewed with the patient and are negative.  I have reviewed the past medical history, past surgical history, social history and family history with the patient and they are unchanged from previous note.  ALLERGIES:  has No Known Allergies.  MEDICATIONS:  Current Outpatient Medications  Medication Sig Dispense Refill  . albuterol (PROVENTIL HFA;VENTOLIN HFA) 108 (90 Base) MCG/ACT inhaler Inhale into the lungs every 6 (six) hours as needed for wheezing or shortness of breath.    .Marland Kitchenatenolol-chlorthalidone (TENORETIC) 100-25 MG tablet Take 1 tablet by mouth daily.    . clonazePAM (KLONOPIN) 0.5 MG tablet Take 0.5 mg by mouth at bedtime.    . lidocaine (XYLOCAINE) 2 % solution Use as directed 5 mLs in the mouth or throat every 3 (three) hours as needed for mouth pain. Swish and spit 200 mL 2  . lidocaine-prilocaine (EMLA) cream Apply to affected area once 30 g 3  .  magic mouthwash SOLN Take 5 mLs by mouth 4 (four) times daily as needed for mouth pain. 240 mL 3  . Melatonin 10 MG TABS Take by mouth.    [name] omeprazole (PRILOSEC) 20 MG capsule Take 20 mg by mouth daily.    . ondansetron (ZOFRAN) 8 MG  tablet Take 1 tablet (8 mg total) by mouth 2 (two) times daily as needed (Nausea or vomiting). 30 tablet 1  . Oral Wound Care Products [facility]) LIQD Use as directed 5 mLs in the mouth or throat 6 (six) times daily. 240 mL 3  . potassium chloride SA (K-DUR,KLOR-CON) 20 MEQ tablet Take 20 mEq by mouth 2 (two) times daily.    . prochlorperazine (COMPAZINE) 10 MG tablet Take 1 tablet (10 mg total) by mouth every 6 (six) hours as needed (Nausea or vomiting). 30 tablet 1   No current facility-administered medications for this visit.    Facility-Administered Medications Ordered in Other Visits  Medication Dose Route Frequency Provider Last Rate Last Dose  . sodium chloride flush (NS) 0.9 % injection 10 mL  10 mL Intracatheter PRN Nicholas Lose, MD   10 mL at 07/01/18 0946    PHYSICAL EXAMINATION: ECOG PERFORMANCE STATUS: 1 - Symptomatic but completely ambulatory  There were no vitals filed for this visit. There were no vitals filed for this visit.  GENERAL: alert, no distress and comfortable SKIN: skin color, texture, turgor are normal, no rashes or significant lesions EYES: normal, Conjunctiva are pink and non-injected, sclera clear OROPHARYNX: no exudate, no erythema and lips, buccal mucosa, and tongue normal  NECK: supple, thyroid normal size, non-tender, without nodularity LYMPH: no palpable lymphadenopathy in the cervical, axillary or inguinal LUNGS: clear to auscultation and percussion with normal breathing effort HEART: regular rate & rhythm and no murmurs and no lower extremity edema ABDOMEN: abdomen soft, non-tender and normal bowel sounds MUSCULOSKELETAL: no cyanosis of digits and no clubbing  NEURO: alert & oriented x 3 with fluent speech, no focal motor/sensory deficits EXTREMITIES: No lower extremity edema  LABORATORY DATA:  I have reviewed the data as listed CMP Latest Ref Rng & Units 06/24/2018 06/17/2018 06/10/2018  Glucose 70 - 99 mg/dL 96 125(H) 98  BUN 6 - 20 mg/dL '15 13 13   ' Creatinine 0.44 - 1.00 mg/dL 0.80 0.80 0.77  Sodium 135 - 145 mmol/L 138 138 138  Potassium 3.5 - 5.1 mmol/L 3.4(L) 3.3(L) 3.9  Chloride 98 - 111 mmol/L 102 102 103  CO2 22 - 32 mmol/L '28 26 27  ' Calcium 8.9 - 10.3 mg/dL 9.0 9.4 9.6  Total Protein 6.5 - 8.1 g/dL 7.3 7.3 7.0  Total Bilirubin 0.3 - 1.2 mg/dL 0.5 0.4 0.5  Alkaline Phos 38 - 126 U/L 71 78 75  AST 15 - 41 U/L 34 34 32  ALT 0 - 44 U/L 36 39 36    Lab Results  Component Value Date   WBC 5.2 06/24/2018   HGB 10.8 (L) 06/24/2018   HCT 31.3 (L) 06/24/2018   MCV 91.5 06/24/2018   PLT 250 06/24/2018   NEUTROABS 2.8 06/24/2018    ASSESSMENT & PLAN:  Malignant neoplasm of lower-outer quadrant of right breast of female, estrogen receptor positive ([place]) 03/06/2018:Screening mammogram detected right breast calcifications, 8 mm mass detected by ultrasound, axilla negative, 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 Adj Chemo with Taxol-Herceptin weekly X 12 followed by Herceptin q 3 weeks for a year. 2. Adj RT 3. Foll by Adj Anti estrogen therapy. ------------------------------------------------------------------------------------------------------------------------------------------- Current treatment: Cycle8 Taxol Herceptin Labs reviewed Echocardiogram: EF 60 to 65%  Chemotoxicities: 1.Chemo induced anemia: Monitoring today's hemoglobin is 10.8. 2.Mouth sores that resolved with ice 3.Hair thinning Watching very closely for neuropathy.  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 I suspect that we may be reducing the dosage from cycle 10 of her chemo. Monitoring closely. She is icing her hands. Tolerated the treatment extremely well.  Previous problems with carpal tunnel syndrome. She plans to go to [employer] in  October.  Return to clinicweekly for Taxol every other week for follow-up with me.    No orders of the defined types were placed in this encounter.  The patient has a good understanding of the overall plan. she agrees with it. she will call with any problems that may develop before the next visit here.  Nicholas Lose, MD 07/01/2018  Julious Oka Dorshimer am acting as scribe for Dr. Nicholas Lose.  I have reviewed the above documentation for accuracy and completeness, and I agree with the above.

## 2018-07-01 ENCOUNTER — Inpatient Hospital Stay: Payer: Managed Care, Other (non HMO)

## 2018-07-01 ENCOUNTER — Inpatient Hospital Stay (HOSPITAL_BASED_OUTPATIENT_CLINIC_OR_DEPARTMENT_OTHER): Payer: Managed Care, Other (non HMO) | Admitting: Hematology and Oncology

## 2018-07-01 ENCOUNTER — Encounter: Payer: Self-pay | Admitting: *Deleted

## 2018-07-01 ENCOUNTER — Other Ambulatory Visit: Payer: Self-pay

## 2018-07-01 DIAGNOSIS — C50511 Malignant neoplasm of lower-outer quadrant of right female breast: Secondary | ICD-10-CM

## 2018-07-01 DIAGNOSIS — Z5111 Encounter for antineoplastic chemotherapy: Secondary | ICD-10-CM

## 2018-07-01 DIAGNOSIS — G5600 Carpal tunnel syndrome, unspecified upper limb: Secondary | ICD-10-CM

## 2018-07-01 DIAGNOSIS — T451X5A Adverse effect of antineoplastic and immunosuppressive drugs, initial encounter: Secondary | ICD-10-CM

## 2018-07-01 DIAGNOSIS — Z170 Estrogen receptor positive status [ER+]: Secondary | ICD-10-CM

## 2018-07-01 DIAGNOSIS — L658 Other specified nonscarring hair loss: Secondary | ICD-10-CM

## 2018-07-01 DIAGNOSIS — D6481 Anemia due to antineoplastic chemotherapy: Secondary | ICD-10-CM

## 2018-07-01 DIAGNOSIS — Z5112 Encounter for antineoplastic immunotherapy: Secondary | ICD-10-CM | POA: Diagnosis not present

## 2018-07-01 DIAGNOSIS — G629 Polyneuropathy, unspecified: Secondary | ICD-10-CM

## 2018-07-01 DIAGNOSIS — K1379 Other lesions of oral mucosa: Secondary | ICD-10-CM

## 2018-07-01 DIAGNOSIS — Z95828 Presence of other vascular implants and grafts: Secondary | ICD-10-CM

## 2018-07-01 DIAGNOSIS — Z79899 Other long term (current) drug therapy: Secondary | ICD-10-CM

## 2018-07-01 DIAGNOSIS — Z7951 Long term (current) use of inhaled steroids: Secondary | ICD-10-CM

## 2018-07-01 DIAGNOSIS — R51 Headache: Secondary | ICD-10-CM

## 2018-07-01 LAB — CBC WITH DIFFERENTIAL (CANCER CENTER ONLY)
Abs Immature Granulocytes: 0.05 10*3/uL (ref 0.00–0.07)
Basophils Absolute: 0 10*3/uL (ref 0.0–0.1)
Basophils Relative: 1 %
Eosinophils Absolute: 0.1 10*3/uL (ref 0.0–0.5)
Eosinophils Relative: 3 %
HCT: 31.5 % — ABNORMAL LOW (ref 36.0–46.0)
Hemoglobin: 10.7 g/dL — ABNORMAL LOW (ref 12.0–15.0)
Immature Granulocytes: 1 %
Lymphocytes Relative: 36 %
Lymphs Abs: 1.7 10*3/uL (ref 0.7–4.0)
MCH: 31.4 pg (ref 26.0–34.0)
MCHC: 34 g/dL (ref 30.0–36.0)
MCV: 92.4 fL (ref 80.0–100.0)
Monocytes Absolute: 0.3 10*3/uL (ref 0.1–1.0)
Monocytes Relative: 7 %
Neutro Abs: 2.4 10*3/uL (ref 1.7–7.7)
Neutrophils Relative %: 52 %
Platelet Count: 275 10*3/uL (ref 150–400)
RBC: 3.41 MIL/uL — ABNORMAL LOW (ref 3.87–5.11)
RDW: 14.2 % (ref 11.5–15.5)
WBC Count: 4.7 10*3/uL (ref 4.0–10.5)
nRBC: 0 % (ref 0.0–0.2)

## 2018-07-01 LAB — CMP (CANCER CENTER ONLY)
ALT: 31 U/L (ref 0–44)
AST: 29 U/L (ref 15–41)
Albumin: 3.9 g/dL (ref 3.5–5.0)
Alkaline Phosphatase: 68 U/L (ref 38–126)
Anion gap: 8 (ref 5–15)
BUN: 13 mg/dL (ref 6–20)
CO2: 27 mmol/L (ref 22–32)
Calcium: 9.7 mg/dL (ref 8.9–10.3)
Chloride: 103 mmol/L (ref 98–111)
Creatinine: 0.8 mg/dL (ref 0.44–1.00)
GFR, Est AFR Am: >60 mL/min (ref >60)
GFR, Estimated: >60 mL/min (ref >60)
Glucose, Bld: 97 mg/dL (ref 70–99)
Potassium: 4 mmol/L (ref 3.5–5.1)
Sodium: 138 mmol/L (ref 135–145)
Total Bilirubin: 0.6 mg/dL (ref 0.3–1.2)
Total Protein: 7.1 g/dL (ref 6.5–8.1)

## 2018-07-01 MED ORDER — SODIUM CHLORIDE 0.9% FLUSH
10.0000 mL | INTRAVENOUS | Status: DC | PRN
  Administered 2018-07-01: 10 mL
  Filled 2018-07-01: qty 10

## 2018-07-01 MED ORDER — ACETAMINOPHEN 325 MG PO TABS
650.0000 mg | ORAL_TABLET | Freq: Once | ORAL | Status: AC
  Administered 2018-07-01: 10:00:00 650 mg via ORAL

## 2018-07-01 MED ORDER — FAMOTIDINE IN NACL 20-0.9 MG/50ML-% IV SOLN
20.0000 mg | Freq: Once | INTRAVENOUS | Status: AC
  Administered 2018-07-01: 20 mg via INTRAVENOUS

## 2018-07-01 MED ORDER — HEPARIN SOD (PORK) LOCK FLUSH 100 UNIT/ML IV SOLN
500.0000 [IU] | Freq: Once | INTRAVENOUS | Status: AC | PRN
  Administered 2018-07-01: 14:00:00 500 [IU]
  Filled 2018-07-01: qty 5

## 2018-07-01 MED ORDER — DIPHENHYDRAMINE HCL 50 MG/ML IJ SOLN
50.0000 mg | Freq: Once | INTRAMUSCULAR | Status: AC
  Administered 2018-07-01: 11:00:00 50 mg via INTRAVENOUS

## 2018-07-01 MED ORDER — ACETAMINOPHEN 325 MG PO TABS
ORAL_TABLET | ORAL | Status: AC
  Filled 2018-07-01: qty 2

## 2018-07-01 MED ORDER — SODIUM CHLORIDE 0.9 % IV SOLN
Freq: Once | INTRAVENOUS | Status: AC
  Administered 2018-07-01: 10:00:00 via INTRAVENOUS
  Filled 2018-07-01: qty 250

## 2018-07-01 MED ORDER — DIPHENHYDRAMINE HCL 50 MG/ML IJ SOLN
INTRAMUSCULAR | Status: AC
  Filled 2018-07-01: qty 1

## 2018-07-01 MED ORDER — FAMOTIDINE IN NACL 20-0.9 MG/50ML-% IV SOLN
INTRAVENOUS | Status: AC
  Filled 2018-07-01: qty 50

## 2018-07-01 MED ORDER — TRASTUZUMAB CHEMO 150 MG IV SOLR
150.0000 mg | Freq: Once | INTRAVENOUS | Status: AC
  Administered 2018-07-01: 12:00:00 150 mg via INTRAVENOUS
  Filled 2018-07-01: qty 7.14

## 2018-07-01 MED ORDER — SODIUM CHLORIDE 0.9 % IV SOLN
65.0000 mg/m2 | Freq: Once | INTRAVENOUS | Status: AC
  Administered 2018-07-01: 13:00:00 114 mg via INTRAVENOUS
  Filled 2018-07-01: qty 19

## 2018-07-01 NOTE — Patient Instructions (Signed)
[facility] Discharge Instructions for Patients Receiving Chemotherapy  Today you received the following chemotherapy agents Herceptin, Taxol  To help prevent nausea and vomiting after your treatment, we encourage you to take your nausea medication as directed  If you develop nausea and vomiting that is not controlled by your nausea medication, call the clinic.   BELOW ARE SYMPTOMS THAT SHOULD BE REPORTED IMMEDIATELY:  *FEVER GREATER THAN 100.5 F  *CHILLS WITH OR WITHOUT FEVER  NAUSEA AND VOMITING THAT IS NOT CONTROLLED WITH YOUR NAUSEA MEDICATION  *UNUSUAL SHORTNESS OF BREATH  *UNUSUAL BRUISING OR BLEEDING  TENDERNESS IN MOUTH AND THROAT WITH OR WITHOUT PRESENCE OF ULCERS  *URINARY PROBLEMS  *BOWEL PROBLEMS  UNUSUAL RASH Items with * indicate a potential emergency and should be followed up as soon as possible.  Feel free to call the clinic should you have any questions or concerns. The clinic phone number is [phone].  Please show the CHEMO ALERT CARD at check-in to the Emergency Department and triage nurse.   

## 2018-07-08 ENCOUNTER — Other Ambulatory Visit: Payer: Self-pay

## 2018-07-08 ENCOUNTER — Inpatient Hospital Stay: Payer: Managed Care, Other (non HMO)

## 2018-07-08 ENCOUNTER — Inpatient Hospital Stay: Payer: Managed Care, Other (non HMO) | Attending: Hematology and Oncology

## 2018-07-08 VITALS — BP 114/77 | HR 75 | Resp 18 | Wt 156.0 lb

## 2018-07-08 DIAGNOSIS — Z5112 Encounter for antineoplastic immunotherapy: Secondary | ICD-10-CM | POA: Insufficient documentation

## 2018-07-08 DIAGNOSIS — T451X5A Adverse effect of antineoplastic and immunosuppressive drugs, initial encounter: Secondary | ICD-10-CM | POA: Diagnosis not present

## 2018-07-08 DIAGNOSIS — C50511 Malignant neoplasm of lower-outer quadrant of right female breast: Secondary | ICD-10-CM

## 2018-07-08 DIAGNOSIS — L658 Other specified nonscarring hair loss: Secondary | ICD-10-CM | POA: Insufficient documentation

## 2018-07-08 DIAGNOSIS — Z95828 Presence of other vascular implants and grafts: Secondary | ICD-10-CM

## 2018-07-08 DIAGNOSIS — Z5111 Encounter for antineoplastic chemotherapy: Secondary | ICD-10-CM | POA: Diagnosis present

## 2018-07-08 DIAGNOSIS — Z170 Estrogen receptor positive status [ER+]: Secondary | ICD-10-CM | POA: Insufficient documentation

## 2018-07-08 DIAGNOSIS — G5600 Carpal tunnel syndrome, unspecified upper limb: Secondary | ICD-10-CM | POA: Insufficient documentation

## 2018-07-08 DIAGNOSIS — G620 Drug-induced polyneuropathy: Secondary | ICD-10-CM | POA: Insufficient documentation

## 2018-07-08 DIAGNOSIS — Z79899 Other long term (current) drug therapy: Secondary | ICD-10-CM | POA: Diagnosis not present

## 2018-07-08 DIAGNOSIS — D6481 Anemia due to antineoplastic chemotherapy: Secondary | ICD-10-CM | POA: Diagnosis not present

## 2018-07-08 LAB — CBC WITH DIFFERENTIAL (CANCER CENTER ONLY)
Abs Immature Granulocytes: 0.1 10*3/uL — ABNORMAL HIGH (ref 0.00–0.07)
Basophils Absolute: 0 10*3/uL (ref 0.0–0.1)
Basophils Relative: 1 %
Eosinophils Absolute: 0.1 10*3/uL (ref 0.0–0.5)
Eosinophils Relative: 3 %
HCT: 30.8 % — ABNORMAL LOW (ref 36.0–46.0)
Hemoglobin: 10.6 g/dL — ABNORMAL LOW (ref 12.0–15.0)
Immature Granulocytes: 2 %
Lymphocytes Relative: 30 %
Lymphs Abs: 1.5 10*3/uL (ref 0.7–4.0)
MCH: 31.4 pg (ref 26.0–34.0)
MCHC: 34.4 g/dL (ref 30.0–36.0)
MCV: 91.1 fL (ref 80.0–100.0)
Monocytes Absolute: 0.4 10*3/uL (ref 0.1–1.0)
Monocytes Relative: 7 %
Neutro Abs: 3 10*3/uL (ref 1.7–7.7)
Neutrophils Relative %: 57 %
Platelet Count: 264 10*3/uL (ref 150–400)
RBC: 3.38 MIL/uL — ABNORMAL LOW (ref 3.87–5.11)
RDW: 15.2 % (ref 11.5–15.5)
WBC Count: 5.2 10*3/uL (ref 4.0–10.5)
nRBC: 0 % (ref 0.0–0.2)

## 2018-07-08 LAB — CMP (CANCER CENTER ONLY)
ALT: 26 U/L (ref 0–44)
AST: 28 U/L (ref 15–41)
Albumin: 3.8 g/dL (ref 3.5–5.0)
Alkaline Phosphatase: 68 U/L (ref 38–126)
Anion gap: 8 (ref 5–15)
BUN: 13 mg/dL (ref 6–20)
CO2: 27 mmol/L (ref 22–32)
Calcium: 9.2 mg/dL (ref 8.9–10.3)
Chloride: 103 mmol/L (ref 98–111)
Creatinine: 0.77 mg/dL (ref 0.44–1.00)
GFR, Est AFR Am: >60 mL/min (ref >60)
GFR, Estimated: >60 mL/min (ref >60)
Glucose, Bld: 99 mg/dL (ref 70–99)
Potassium: 3.8 mmol/L (ref 3.5–5.1)
Sodium: 138 mmol/L (ref 135–145)
Total Bilirubin: 0.5 mg/dL (ref 0.3–1.2)
Total Protein: 7.1 g/dL (ref 6.5–8.1)

## 2018-07-08 MED ORDER — HEPARIN SOD (PORK) LOCK FLUSH 100 UNIT/ML IV SOLN
500.0000 [IU] | Freq: Once | INTRAVENOUS | Status: AC | PRN
  Administered 2018-07-08: 500 [IU]
  Filled 2018-07-08: qty 5

## 2018-07-08 MED ORDER — FAMOTIDINE IN NACL 20-0.9 MG/50ML-% IV SOLN
20.0000 mg | Freq: Once | INTRAVENOUS | Status: AC
  Administered 2018-07-08: 20 mg via INTRAVENOUS

## 2018-07-08 MED ORDER — ACETAMINOPHEN 325 MG PO TABS
650.0000 mg | ORAL_TABLET | Freq: Once | ORAL | Status: AC
  Administered 2018-07-08: 650 mg via ORAL

## 2018-07-08 MED ORDER — FAMOTIDINE IN NACL 20-0.9 MG/50ML-% IV SOLN
INTRAVENOUS | Status: AC
  Filled 2018-07-08: qty 50

## 2018-07-08 MED ORDER — SODIUM CHLORIDE 0.9 % IV SOLN
Freq: Once | INTRAVENOUS | Status: AC
  Administered 2018-07-08: 10:00:00 via INTRAVENOUS
  Filled 2018-07-08: qty 250

## 2018-07-08 MED ORDER — SODIUM CHLORIDE 0.9% FLUSH
10.0000 mL | INTRAVENOUS | Status: DC | PRN
  Administered 2018-07-08: 10 mL
  Filled 2018-07-08: qty 10

## 2018-07-08 MED ORDER — DIPHENHYDRAMINE HCL 50 MG/ML IJ SOLN
50.0000 mg | Freq: Once | INTRAMUSCULAR | Status: AC
  Administered 2018-07-08: 50 mg via INTRAVENOUS

## 2018-07-08 MED ORDER — SODIUM CHLORIDE 0.9 % IV SOLN
65.0000 mg/m2 | Freq: Once | INTRAVENOUS | Status: AC
  Administered 2018-07-08: 114 mg via INTRAVENOUS
  Filled 2018-07-08: qty 19

## 2018-07-08 MED ORDER — TRASTUZUMAB CHEMO 150 MG IV SOLR
150.0000 mg | Freq: Once | INTRAVENOUS | Status: AC
  Administered 2018-07-08: 150 mg via INTRAVENOUS
  Filled 2018-07-08: qty 7.14

## 2018-07-08 MED ORDER — DIPHENHYDRAMINE HCL 50 MG/ML IJ SOLN
INTRAMUSCULAR | Status: AC
  Filled 2018-07-08: qty 1

## 2018-07-08 MED ORDER — ACETAMINOPHEN 325 MG PO TABS
ORAL_TABLET | ORAL | Status: AC
  Filled 2018-07-08: qty 2

## 2018-07-08 NOTE — Patient Instructions (Signed)
[facility] [place] Discharge Instructions for Patients Receiving Chemotherapy  Today you received the following chemotherapy agent: Taxol and Immunotherapy agent: Herceptin  To help prevent nausea and vomiting after your treatment, we encourage you to take your nausea medication as directed by your MD.   If you develop nausea and vomiting that is not controlled by your nausea medication, call the clinic.   BELOW ARE SYMPTOMS THAT SHOULD BE REPORTED IMMEDIATELY:  *FEVER GREATER THAN 100.5 F  *CHILLS WITH OR WITHOUT FEVER  NAUSEA AND VOMITING THAT IS NOT CONTROLLED WITH YOUR NAUSEA MEDICATION  *UNUSUAL SHORTNESS OF BREATH  *UNUSUAL BRUISING OR BLEEDING  TENDERNESS IN MOUTH AND THROAT WITH OR WITHOUT PRESENCE OF ULCERS  *URINARY PROBLEMS  *BOWEL PROBLEMS  UNUSUAL RASH Items with * indicate a potential emergency and should be followed up as soon as possible.  Feel free to call the clinic should you have any questions or concerns. The clinic phone number is [phone].  Please show the [place] at check-in to the Emergency Department and triage nurse. Coronavirus (COVID-19) Are you at risk?  Are you at risk for the Coronavirus (COVID-19)?  To be considered HIGH RISK for Coronavirus (COVID-19), you have to meet the following criteria:  . Traveled to [place], [place], [place], [place] or [place]; or in the [place] to [place], [place], [place], or [place]; and have fever, cough, and shortness of breath within the last 2 weeks of travel OR . Been in close contact with a person diagnosed with COVID-19 within the last 2 weeks and have fever, cough, and shortness of breath . IF YOU DO NOT MEET THESE CRITERIA, YOU ARE CONSIDERED LOW RISK FOR COVID-19.  What to do if you are HIGH RISK for COVID-19?  [name] If you are having a medical emergency, call 911. . Seek medical care right away. Before you go to a doctor's office, urgent care or emergency department,  call ahead and tell them about your recent travel, contact with someone diagnosed with COVID-19, and your symptoms. You should receive instructions from your physician's office regarding next steps of care.  . When you arrive at healthcare provider, tell the healthcare staff immediately you have returned from visiting [place], [place], [place], [place] or [place]; or traveled in the [place] to [place], [place], [place], or [place]; in the last two weeks or you have been in close contact with a person diagnosed with COVID-19 in the last 2 weeks.   . Tell the health care staff about your symptoms: fever, cough and shortness of breath. . After you have been seen by a medical provider, you will be either: o Tested for (COVID-19) and discharged home on quarantine except to seek medical care if symptoms worsen, and asked to  - Stay home and avoid contact with others until you get your results (4-5 days)  - Avoid travel on public transportation if possible (such as bus, train, or airplane) or o Sent to the Emergency Department by EMS for evaluation, COVID-19 testing, and possible admission depending on your condition and test results.  What to do if you are LOW RISK for COVID-19?  Reduce your risk of any infection by using the same precautions used for avoiding the common cold or flu:  [name] Wash your hands often with soap and warm water for at least 20 seconds.  If soap and water are not readily available, use an alcohol-based hand sanitizer with at least 60% alcohol.  [name]  If coughing or sneezing, cover your mouth and nose by coughing or sneezing into the elbow areas of your shirt or coat, into a tissue or into your sleeve (not your hands). . Avoid shaking hands with others and consider head nods or verbal greetings only. . Avoid touching your eyes, nose, or mouth with unwashed hands.  . Avoid close contact with people who are sick. . Avoid places or events with large numbers of people in one  location, like concerts or sporting events. . Carefully consider travel plans you have or are making. . If you are planning any travel outside or inside the [place], visit the CDC's Travelers' Health webpage for the latest health notices. . If you have some symptoms but not all symptoms, continue to monitor at home and seek medical attention if your symptoms worsen. . If you are having a medical emergency, call 911.   [place] / e-Visit: [email or web address]         [facility]: [facility]: [phone]                   [facility]: [phone]

## 2018-07-09 NOTE — Assessment & Plan Note (Signed)
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 Adj Chemo with Taxol-Herceptin weekly X 12 followed by Herceptin q 3 weeks for a year. 2. Adj RT 3. Foll by Adj Anti estrogen therapy. ------------------------------------------------------------------------------------------------------------------------------------------- Current treatment: Cycle10 Taxol Herceptin Labs reviewed Echocardiogram: EF 60 to 65%  Chemotoxicities: 1.Chemo induced anemia: Monitoring today's hemoglobin is 10.8. 2.Mouth sores that resolved with ice 3.Hair thinning Watching very closely for neuropathy. 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 I suspect that we may be reducing the dosage from cycle 10 of her chemo. Monitoring closely. She is icing her hands. Tolerated the treatment extremely well.  Previous problems with carpal tunnel syndrome. She plans to go to [place] in October.  Return to clinicweekly for Taxol every other week for follow-up with me.

## 2018-07-14 NOTE — Progress Notes (Signed)
Patient Care Team: [name], MD as PCP - General (Family Medicine) Mauro Kaufmann, RN as Oncology Nurse Navigator Rockwell Germany, RN as Oncology Nurse Navigator  DIAGNOSIS:    ICD-10-CM   1. 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    05/14/2018 -  Chemotherapy   The patient had trastuzumab (HERCEPTIN) 300 mg in sodium chloride 0.9 % 250 mL chemo infusion, 273 mg, Intravenous,  Once, 3 of 16 cycles Dose modification: 6 mg/kg (original dose 2 mg/kg, Cycle 3, Reason: Other (see comments), Comment: switch to maintenance Herceptin - every 3 weeks) Administration: 300 mg (05/14/2018), 150 mg (05/20/2018), 150 mg (06/10/2018), 147 mg (05/27/2018), 147 mg (06/03/2018), 150 mg (06/17/2018), 150 mg (06/24/2018), 150 mg (07/01/2018), 150 mg (07/08/2018) PACLitaxel (TAXOL) 138 mg in sodium chloride 0.9 % 250 mL chemo infusion (</= 35m/m2), 80 mg/m2 = 138 mg, Intravenous,  Once, 3 of 3 cycles Dose modification: 65 mg/m2 (original dose 80 mg/m2, Cycle 2, Reason: Dose not tolerated) Administration: 138 mg (05/14/2018), 138 mg (05/20/2018), 114 mg (06/10/2018), 114 mg  (05/27/2018), 114 mg (06/03/2018), 114 mg (06/17/2018), 114 mg (06/24/2018), 114 mg (07/01/2018), 114 mg (07/08/2018)  for chemotherapy treatment.      CHIEF COMPLIANT: Cycle10Taxol Herceptin  INTERVAL HISTORY: RMaly Montgomery a 58y.o. with above-mentioned history of right breast cancer who underwent a lumpectomyandis currently onadjuvant chemotherapy with weekly Taxol and Herceptin.She presents to the clinic todayfor cycle 10.  She is tolerating chemotherapy fairly well.  She has mild peripheral neuropathy.  REVIEW OF SYSTEMS:   Constitutional: Denies fevers, chills or abnormal weight loss Eyes: Denies blurriness of vision Ears, nose, mouth, throat, and face: Denies mucositis or sore throat Respiratory: Denies cough, dyspnea or wheezes Cardiovascular: Denies palpitation, chest discomfort Gastrointestinal: Denies nausea, heartburn or change in bowel habits Skin: Denies abnormal skin rashes Lymphatics: Denies new lymphadenopathy or easy bruising Neurological: Mild peripheral neuropathy in the tips of the fingers and toes Behavioral/Psych: Mood is stable, no new changes  Extremities: Mild right arm swelling Breast: denies any pain or lumps or nodules in either breasts All other systems were reviewed with the patient and are negative.  I have reviewed the past medical history, past surgical history, social history and family history with the patient and they are unchanged from previous note.  ALLERGIES:  has No Known Allergies.  MEDICATIONS:  Current Outpatient Medications  Medication Sig Dispense Refill  . albuterol (PROVENTIL HFA;VENTOLIN HFA) 108 (90 Base) MCG/ACT inhaler Inhale into the lungs every 6 (six) hours as needed for wheezing or shortness of breath.    .Marland Kitchenatenolol-chlorthalidone (TENORETIC) 100-25 MG tablet Take 1 tablet by  mouth daily.    . clonazePAM (KLONOPIN) 0.5 MG tablet Take 0.5 mg by mouth at bedtime.    . lidocaine (XYLOCAINE) 2 % solution Use as directed  5 mLs in the mouth or throat every 3 (three) hours as needed for mouth pain. Swish and spit 200 mL 2  . lidocaine-prilocaine (EMLA) cream Apply to affected area once 30 g 3  . magic mouthwash SOLN Take 5 mLs by mouth 4 (four) times daily as needed for mouth pain. 240 mL 3  . Melatonin 10 MG TABS Take by mouth.    [name] omeprazole (PRILOSEC) 20 MG capsule Take 20 mg by mouth daily.    . ondansetron (ZOFRAN) 8 MG tablet Take 1 tablet (8 mg total) by mouth 2 (two) times daily as needed (Nausea or vomiting). 30 tablet 1  . Oral Wound Care Products [facility]) LIQD Use as directed 5 mLs in the mouth or throat 6 (six) times daily. 240 mL 3  . potassium chloride SA (K-DUR,KLOR-CON) 20 MEQ tablet Take 20 mEq by mouth 2 (two) times daily.    . prochlorperazine (COMPAZINE) 10 MG tablet Take 1 tablet (10 mg total) by mouth every 6 (six) hours as needed (Nausea or vomiting). 30 tablet 1   No current facility-administered medications for this visit.     PHYSICAL EXAMINATION: ECOG PERFORMANCE STATUS: 1 - Symptomatic but completely ambulatory  Vitals:   07/15/18 1048  BP: 129/78  Pulse: 85  Resp: 18  Temp: 99.1 F (37.3 C)  SpO2: 99%   Filed Weights   07/15/18 1048  Weight: 156 lb 11.2 oz (71.1 kg)    Physical exam not done due to COVID-19 precautions Patient experiencing swelling of the right arm  LABORATORY DATA:  I have reviewed the data as listed CMP Latest Ref Rng & Units 07/15/2018 07/08/2018 07/01/2018  Glucose 70 - 99 mg/dL 96 99 97  BUN 6 - 20 mg/dL _0 Creatinine 0.44 - 1.00 mg/dL 0.75 0.77 0.80  Sodium 135 - 145 mmol/L 139 138 138  Potassium 3.5 - 5.1 mmol/L 3.5 3.8 4.0  Chloride 98 - 111 mmol/L 103 103 103  CO2 22 - 32 mmol/L _1 Calcium 8.9 - 10.3 mg/dL 9.0 9.2 9.7  Total Protein 6.5 - 8.1 g/dL 7.1 7.1 7.1  Total Bilirubin 0.3 - 1.2 mg/dL 0.6 0.5 0.6  Alkaline Phos 38 - 126 U/L 66 68 68  AST 15 - 41 U/L _2 ALT 0 - 44 U/L _3 Lab Results  Component  Value Date   WBC 5.4 07/15/2018   HGB 10.3 (L) 07/15/2018   HCT 30.0 (L) 07/15/2018   MCV 92.3 07/15/2018   PLT 244 07/15/2018   NEUTROABS 3.1 07/15/2018    ASSESSMENT & PLAN:  Malignant neoplasm of lower-outer quadrant of right breast of female, estrogen receptor positive ([place]) 03/06/2018:Screening mammogram detected right breast calcifications, 8 mm mass detected by ultrasound, axilla negative, 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 Adj Chemo with Taxol-Herceptin weekly X 12 followed by Herceptin q 3 weeks for a year. 2. Adj RT 3. Foll by Adj Anti estrogen therapy. ------------------------------------------------------------------------------------------------------------------------------------------- Current treatment: Cycle10 Taxol Herceptin Labs reviewed Echocardiogram: EF 60 to 65%  Chemotoxicities: 1.Chemo induced anemia: Monitoring today's hemoglobin is 10.3. 2.Mouth sores that resolved with ice 3.Hair thinning 4.  Chemo-induced peripheral neuropathy: I decrease the dosage of Taxol down to 50 mg/m today.  Previous problems with carpal tunnel syndrome. She plans to go to [employer] in October. She would like to receive radiation at [facility].  We will send a referral to Dr. Pablo Ledger for adjuvant radiation therapy. We will provide her with appointments for every 3-week Herceptin after completion of 12 cycles of Taxol Herceptin.  Return to clinicweekly for Taxol every other week for follow-up with me.  No orders of the defined types were placed in this encounter.  The patient has a good understanding of the overall plan. she agrees with it. she will call with any problems that may develop before the next visit here.  Nicholas Lose, MD 07/15/2018  Julious Oka Dorshimer  am acting as scribe for Dr. Nicholas Lose.  I have reviewed the above documentation for accuracy and completeness, and I agree with the above.

## 2018-07-15 ENCOUNTER — Inpatient Hospital Stay: Payer: Managed Care, Other (non HMO)

## 2018-07-15 ENCOUNTER — Other Ambulatory Visit: Payer: Self-pay | Admitting: *Deleted

## 2018-07-15 ENCOUNTER — Inpatient Hospital Stay (HOSPITAL_BASED_OUTPATIENT_CLINIC_OR_DEPARTMENT_OTHER): Payer: Managed Care, Other (non HMO) | Admitting: Hematology and Oncology

## 2018-07-15 ENCOUNTER — Encounter: Payer: Self-pay | Admitting: *Deleted

## 2018-07-15 ENCOUNTER — Other Ambulatory Visit: Payer: Self-pay

## 2018-07-15 DIAGNOSIS — C50511 Malignant neoplasm of lower-outer quadrant of right female breast: Secondary | ICD-10-CM | POA: Diagnosis not present

## 2018-07-15 DIAGNOSIS — Z5111 Encounter for antineoplastic chemotherapy: Secondary | ICD-10-CM

## 2018-07-15 DIAGNOSIS — Z170 Estrogen receptor positive status [ER+]: Secondary | ICD-10-CM | POA: Diagnosis not present

## 2018-07-15 DIAGNOSIS — D6481 Anemia due to antineoplastic chemotherapy: Secondary | ICD-10-CM

## 2018-07-15 DIAGNOSIS — G629 Polyneuropathy, unspecified: Secondary | ICD-10-CM

## 2018-07-15 DIAGNOSIS — Z5112 Encounter for antineoplastic immunotherapy: Secondary | ICD-10-CM

## 2018-07-15 DIAGNOSIS — T451X5A Adverse effect of antineoplastic and immunosuppressive drugs, initial encounter: Secondary | ICD-10-CM

## 2018-07-15 DIAGNOSIS — Z79899 Other long term (current) drug therapy: Secondary | ICD-10-CM

## 2018-07-15 DIAGNOSIS — L659 Nonscarring hair loss, unspecified: Secondary | ICD-10-CM

## 2018-07-15 DIAGNOSIS — Z95828 Presence of other vascular implants and grafts: Secondary | ICD-10-CM

## 2018-07-15 LAB — CBC WITH DIFFERENTIAL (CANCER CENTER ONLY)
Abs Immature Granulocytes: 0.06 10*3/uL (ref 0.00–0.07)
Basophils Absolute: 0 10*3/uL (ref 0.0–0.1)
Basophils Relative: 1 %
Eosinophils Absolute: 0.2 10*3/uL (ref 0.0–0.5)
Eosinophils Relative: 3 %
HCT: 30 % — ABNORMAL LOW (ref 36.0–46.0)
Hemoglobin: 10.3 g/dL — ABNORMAL LOW (ref 12.0–15.0)
Immature Granulocytes: 1 %
Lymphocytes Relative: 29 %
Lymphs Abs: 1.6 10*3/uL (ref 0.7–4.0)
MCH: 31.7 pg (ref 26.0–34.0)
MCHC: 34.3 g/dL (ref 30.0–36.0)
MCV: 92.3 fL (ref 80.0–100.0)
Monocytes Absolute: 0.4 10*3/uL (ref 0.1–1.0)
Monocytes Relative: 8 %
Neutro Abs: 3.1 10*3/uL (ref 1.7–7.7)
Neutrophils Relative %: 58 %
Platelet Count: 244 10*3/uL (ref 150–400)
RBC: 3.25 MIL/uL — ABNORMAL LOW (ref 3.87–5.11)
RDW: 15.7 % — ABNORMAL HIGH (ref 11.5–15.5)
WBC Count: 5.4 10*3/uL (ref 4.0–10.5)
nRBC: 0 % (ref 0.0–0.2)

## 2018-07-15 LAB — CMP (CANCER CENTER ONLY)
ALT: 27 U/L (ref 0–44)
AST: 29 U/L (ref 15–41)
Albumin: 3.8 g/dL (ref 3.5–5.0)
Alkaline Phosphatase: 66 U/L (ref 38–126)
Anion gap: 9 (ref 5–15)
BUN: 16 mg/dL (ref 6–20)
CO2: 27 mmol/L (ref 22–32)
Calcium: 9 mg/dL (ref 8.9–10.3)
Chloride: 103 mmol/L (ref 98–111)
Creatinine: 0.75 mg/dL (ref 0.44–1.00)
GFR, Est AFR Am: >60 mL/min (ref >60)
GFR, Estimated: >60 mL/min (ref >60)
Glucose, Bld: 96 mg/dL (ref 70–99)
Potassium: 3.5 mmol/L (ref 3.5–5.1)
Sodium: 139 mmol/L (ref 135–145)
Total Bilirubin: 0.6 mg/dL (ref 0.3–1.2)
Total Protein: 7.1 g/dL (ref 6.5–8.1)

## 2018-07-15 MED ORDER — DIPHENHYDRAMINE HCL 50 MG/ML IJ SOLN
50.0000 mg | Freq: Once | INTRAMUSCULAR | Status: AC
  Administered 2018-07-15: 50 mg via INTRAVENOUS

## 2018-07-15 MED ORDER — SODIUM CHLORIDE 0.9% FLUSH
10.0000 mL | INTRAVENOUS | Status: DC | PRN
  Administered 2018-07-15: 10 mL
  Filled 2018-07-15: qty 10

## 2018-07-15 MED ORDER — SODIUM CHLORIDE 0.9 % IV SOLN
50.0000 mg/m2 | Freq: Once | INTRAVENOUS | Status: AC
  Administered 2018-07-15: 90 mg via INTRAVENOUS
  Filled 2018-07-15: qty 15

## 2018-07-15 MED ORDER — SODIUM CHLORIDE 0.9 % IV SOLN
Freq: Once | INTRAVENOUS | Status: AC
  Administered 2018-07-15: 12:00:00 via INTRAVENOUS
  Filled 2018-07-15: qty 250

## 2018-07-15 MED ORDER — ACETAMINOPHEN 325 MG PO TABS
650.0000 mg | ORAL_TABLET | Freq: Once | ORAL | Status: AC
  Administered 2018-07-15: 650 mg via ORAL

## 2018-07-15 MED ORDER — HEPARIN SOD (PORK) LOCK FLUSH 100 UNIT/ML IV SOLN
500.0000 [IU] | Freq: Once | INTRAVENOUS | Status: AC | PRN
  Administered 2018-07-15: 500 [IU]
  Filled 2018-07-15: qty 5

## 2018-07-15 MED ORDER — TRASTUZUMAB CHEMO 150 MG IV SOLR
150.0000 mg | Freq: Once | INTRAVENOUS | Status: AC
  Administered 2018-07-15: 150 mg via INTRAVENOUS
  Filled 2018-07-15: qty 7.14

## 2018-07-15 MED ORDER — DIPHENHYDRAMINE HCL 50 MG/ML IJ SOLN
INTRAMUSCULAR | Status: AC
  Filled 2018-07-15: qty 1

## 2018-07-15 MED ORDER — ACETAMINOPHEN 325 MG PO TABS
ORAL_TABLET | ORAL | Status: AC
  Filled 2018-07-15: qty 2

## 2018-07-15 MED ORDER — FAMOTIDINE IN NACL 20-0.9 MG/50ML-% IV SOLN
20.0000 mg | Freq: Once | INTRAVENOUS | Status: AC
  Administered 2018-07-15: 20 mg via INTRAVENOUS

## 2018-07-15 MED ORDER — FAMOTIDINE IN NACL 20-0.9 MG/50ML-% IV SOLN
INTRAVENOUS | Status: AC
  Filled 2018-07-15: qty 50

## 2018-07-15 NOTE — Patient Instructions (Signed)
[facility] Discharge Instructions for Patients Receiving Chemotherapy  Today you received the following chemotherapy agents Taxol and Immunotherapy agent: Herceptin  To help prevent nausea and vomiting after your treatment, we encourage you to take your nausea medication as directed by your MD.   If you develop nausea and vomiting that is not controlled by your nausea medication, call the clinic.   BELOW ARE SYMPTOMS THAT SHOULD BE REPORTED IMMEDIATELY:  *FEVER GREATER THAN 100.5 F  *CHILLS WITH OR WITHOUT FEVER  NAUSEA AND VOMITING THAT IS NOT CONTROLLED WITH YOUR NAUSEA MEDICATION  *UNUSUAL SHORTNESS OF BREATH  *UNUSUAL BRUISING OR BLEEDING  TENDERNESS IN MOUTH AND THROAT WITH OR WITHOUT PRESENCE OF ULCERS  *URINARY PROBLEMS  *BOWEL PROBLEMS  UNUSUAL RASH Items with * indicate a potential emergency and should be followed up as soon as possible.  Feel free to call the clinic should you have any questions or concerns. The clinic phone number is [phone].  Please show the [place] at check-in to the Emergency Department and triage nurse. Coronavirus (COVID-19) Are you at risk?  Are you at risk for the Coronavirus (COVID-19)?  To be considered HIGH RISK for Coronavirus (COVID-19), you have to meet the following criteria:  . Traveled to [place], [place], [place], [place] or [place]; or in the [place] to [place], [place], [place], or [place]; and have fever, cough, and shortness of breath within the last 2 weeks of travel OR . Been in close contact with a person diagnosed with COVID-19 within the last 2 weeks and have fever, cough, and shortness of breath . IF YOU DO NOT MEET THESE CRITERIA, YOU ARE CONSIDERED LOW RISK FOR COVID-19.  What to do if you are HIGH RISK for COVID-19?  [name] If you are having a medical emergency, call 911. . Seek medical care right away. Before you go to a doctor's office, urgent care or emergency department,  call ahead and tell them about your recent travel, contact with someone diagnosed with COVID-19, and your symptoms. You should receive instructions from your physician's office regarding next steps of care.  . When you arrive at healthcare provider, tell the healthcare staff immediately you have returned from visiting [place], [place], [place], [place] or [place]; or traveled in the [place] to [place], [place], [place], or [place]; in the last two weeks or you have been in close contact with a person diagnosed with COVID-19 in the last 2 weeks.   . Tell the health care staff about your symptoms: fever, cough and shortness of breath. . After you have been seen by a medical provider, you will be either: o Tested for (COVID-19) and discharged home on quarantine except to seek medical care if symptoms worsen, and asked to  - Stay home and avoid contact with others until you get your results (4-5 days)  - Avoid travel on public transportation if possible (such as bus, train, or airplane) or o Sent to the Emergency Department by EMS for evaluation, COVID-19 testing, and possible admission depending on your condition and test results.  What to do if you are LOW RISK for COVID-19?  Reduce your risk of any infection by using the same precautions used for avoiding the common cold or flu:  [name] Wash your hands often with soap and warm water for at least 20 seconds.  If soap and water are not readily available, use an alcohol-based hand sanitizer with at least 60% alcohol.  [name]  If coughing or sneezing, cover your mouth and nose by coughing or sneezing into the elbow areas of your shirt or coat, into a tissue or into your sleeve (not your hands). . Avoid shaking hands with others and consider head nods or verbal greetings only. . Avoid touching your eyes, nose, or mouth with unwashed hands.  . Avoid close contact with people who are sick. . Avoid places or events with large numbers of people in one  location, like concerts or sporting events. . Carefully consider travel plans you have or are making. . If you are planning any travel outside or inside the [place], visit the CDC's Travelers' Health webpage for the latest health notices. . If you have some symptoms but not all symptoms, continue to monitor at home and seek medical attention if your symptoms worsen. . If you are having a medical emergency, call 911.   [place] / e-Visit: [email or web address]         [facility]: [facility]: [phone]                   [facility]: [phone]

## 2018-07-15 NOTE — Progress Notes (Signed)
Left vm to assess for needs or questions. Contact information provided.

## 2018-07-15 NOTE — Progress Notes (Signed)
Referral placed for pt to be seen by Dr. Thea Silversmith for radiation.

## 2018-07-16 ENCOUNTER — Telehealth: Payer: Self-pay | Admitting: *Deleted

## 2018-07-16 NOTE — Telephone Encounter (Signed)
Referral/records faxed to [facility] - att Maudie Mercury - Release [id number]

## 2018-07-22 ENCOUNTER — Inpatient Hospital Stay: Payer: Managed Care, Other (non HMO)

## 2018-07-22 ENCOUNTER — Other Ambulatory Visit: Payer: Self-pay

## 2018-07-22 VITALS — BP 132/82 | HR 81 | Temp 99.1°F | Resp 16 | Ht 61.0 in | Wt 154.4 lb

## 2018-07-22 DIAGNOSIS — C50511 Malignant neoplasm of lower-outer quadrant of right female breast: Secondary | ICD-10-CM

## 2018-07-22 DIAGNOSIS — Z5112 Encounter for antineoplastic immunotherapy: Secondary | ICD-10-CM | POA: Diagnosis not present

## 2018-07-22 DIAGNOSIS — Z170 Estrogen receptor positive status [ER+]: Secondary | ICD-10-CM

## 2018-07-22 DIAGNOSIS — Z95828 Presence of other vascular implants and grafts: Secondary | ICD-10-CM

## 2018-07-22 LAB — CBC WITH DIFFERENTIAL (CANCER CENTER ONLY)
Abs Immature Granulocytes: 0.13 10*3/uL — ABNORMAL HIGH (ref 0.00–0.07)
Basophils Absolute: 0 10*3/uL (ref 0.0–0.1)
Basophils Relative: 1 %
Eosinophils Absolute: 0.1 10*3/uL (ref 0.0–0.5)
Eosinophils Relative: 2 %
HCT: 29.8 % — ABNORMAL LOW (ref 36.0–46.0)
Hemoglobin: 10.1 g/dL — ABNORMAL LOW (ref 12.0–15.0)
Immature Granulocytes: 2 %
Lymphocytes Relative: 25 %
Lymphs Abs: 1.3 10*3/uL (ref 0.7–4.0)
MCH: 32 pg (ref 26.0–34.0)
MCHC: 33.9 g/dL (ref 30.0–36.0)
MCV: 94.3 fL (ref 80.0–100.0)
Monocytes Absolute: 0.5 10*3/uL (ref 0.1–1.0)
Monocytes Relative: 8 %
Neutro Abs: 3.4 10*3/uL (ref 1.7–7.7)
Neutrophils Relative %: 62 %
Platelet Count: 251 10*3/uL (ref 150–400)
RBC: 3.16 MIL/uL — ABNORMAL LOW (ref 3.87–5.11)
RDW: 16.4 % — ABNORMAL HIGH (ref 11.5–15.5)
WBC Count: 5.4 10*3/uL (ref 4.0–10.5)
nRBC: 0.4 % — ABNORMAL HIGH (ref 0.0–0.2)

## 2018-07-22 LAB — CMP (CANCER CENTER ONLY)
ALT: 24 U/L (ref 0–44)
AST: 29 U/L (ref 15–41)
Albumin: 3.6 g/dL (ref 3.5–5.0)
Alkaline Phosphatase: 62 U/L (ref 38–126)
Anion gap: 9 (ref 5–15)
BUN: 14 mg/dL (ref 6–20)
CO2: 27 mmol/L (ref 22–32)
Calcium: 9.4 mg/dL (ref 8.9–10.3)
Chloride: 101 mmol/L (ref 98–111)
Creatinine: 0.77 mg/dL (ref 0.44–1.00)
GFR, Est AFR Am: >60 mL/min (ref >60)
GFR, Estimated: >60 mL/min (ref >60)
Glucose, Bld: 99 mg/dL (ref 70–99)
Potassium: 3.9 mmol/L (ref 3.5–5.1)
Sodium: 137 mmol/L (ref 135–145)
Total Bilirubin: 0.6 mg/dL (ref 0.3–1.2)
Total Protein: 6.8 g/dL (ref 6.5–8.1)

## 2018-07-22 MED ORDER — SODIUM CHLORIDE 0.9% FLUSH
10.0000 mL | INTRAVENOUS | Status: DC | PRN
  Administered 2018-07-22: 16:00:00 10 mL
  Filled 2018-07-22: qty 10

## 2018-07-22 MED ORDER — SODIUM CHLORIDE 0.9 % IV SOLN
50.0000 mg/m2 | Freq: Once | INTRAVENOUS | Status: AC
  Administered 2018-07-22: 90 mg via INTRAVENOUS
  Filled 2018-07-22: qty 15

## 2018-07-22 MED ORDER — SODIUM CHLORIDE 0.9 % IV SOLN
Freq: Once | INTRAVENOUS | Status: AC
  Administered 2018-07-22: 14:00:00 via INTRAVENOUS
  Filled 2018-07-22: qty 250

## 2018-07-22 MED ORDER — SODIUM CHLORIDE 0.9% FLUSH
10.0000 mL | INTRAVENOUS | Status: DC | PRN
  Administered 2018-07-22: 10 mL
  Filled 2018-07-22: qty 10

## 2018-07-22 MED ORDER — ACETAMINOPHEN 325 MG PO TABS
650.0000 mg | ORAL_TABLET | Freq: Once | ORAL | Status: AC
  Administered 2018-07-22: 650 mg via ORAL

## 2018-07-22 MED ORDER — TRASTUZUMAB CHEMO 150 MG IV SOLR
150.0000 mg | Freq: Once | INTRAVENOUS | Status: AC
  Administered 2018-07-22: 150 mg via INTRAVENOUS
  Filled 2018-07-22: qty 7.14

## 2018-07-22 MED ORDER — HEPARIN SOD (PORK) LOCK FLUSH 100 UNIT/ML IV SOLN
500.0000 [IU] | Freq: Once | INTRAVENOUS | Status: AC | PRN
  Administered 2018-07-22: 500 [IU]
  Filled 2018-07-22: qty 5

## 2018-07-22 MED ORDER — FAMOTIDINE IN NACL 20-0.9 MG/50ML-% IV SOLN
20.0000 mg | Freq: Once | INTRAVENOUS | Status: AC
  Administered 2018-07-22: 20 mg via INTRAVENOUS

## 2018-07-22 MED ORDER — FAMOTIDINE IN NACL 20-0.9 MG/50ML-% IV SOLN
INTRAVENOUS | Status: AC
  Filled 2018-07-22: qty 50

## 2018-07-22 MED ORDER — DIPHENHYDRAMINE HCL 50 MG/ML IJ SOLN
INTRAMUSCULAR | Status: AC
  Filled 2018-07-22: qty 1

## 2018-07-22 MED ORDER — DIPHENHYDRAMINE HCL 50 MG/ML IJ SOLN
50.0000 mg | Freq: Once | INTRAMUSCULAR | Status: AC
  Administered 2018-07-22: 50 mg via INTRAVENOUS

## 2018-07-22 MED ORDER — ACETAMINOPHEN 325 MG PO TABS
ORAL_TABLET | ORAL | Status: AC
  Filled 2018-07-22: qty 2

## 2018-07-22 NOTE — Patient Instructions (Signed)

## 2018-07-22 NOTE — Patient Instructions (Signed)
[facility] Discharge Instructions for Patients Receiving Chemotherapy  Today you received the following chemotherapy agents: Herceptin & Taxol   To help prevent nausea and vomiting after your treatment, we encourage you to take your nausea medication as directed.    If you develop nausea and vomiting that is not controlled by your nausea medication, call the clinic.   BELOW ARE SYMPTOMS THAT SHOULD BE REPORTED IMMEDIATELY:  *FEVER GREATER THAN 100.5 F  *CHILLS WITH OR WITHOUT FEVER  NAUSEA AND VOMITING THAT IS NOT CONTROLLED WITH YOUR NAUSEA MEDICATION  *UNUSUAL SHORTNESS OF BREATH  *UNUSUAL BRUISING OR BLEEDING  TENDERNESS IN MOUTH AND THROAT WITH OR WITHOUT PRESENCE OF ULCERS  *URINARY PROBLEMS  *BOWEL PROBLEMS  UNUSUAL RASH Items with * indicate a potential emergency and should be followed up as soon as possible.  Feel free to call the clinic should you have any questions or concerns. The clinic phone number is [phone].  Please show the [place] at check-in to the Emergency Department and triage nurse.

## 2018-07-24 NOTE — Assessment & Plan Note (Signed)
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 Adj Chemo with Taxol-Herceptin weekly X 12 followed by Herceptin q 3 weeks for a year. 2. Adj RT 3. Foll by Adj Anti estrogen therapy. ------------------------------------------------------------------------------------------------------------------------------------------- Current treatment: Cycle12Taxol Herceptin Labs reviewed Echocardiogram: EF 60 to 65%  Chemotoxicities: 1.Chemo induced anemia: Monitoringtoday's hemoglobin is 10.3. 2.Mouth sores that resolved with ice 3.Hair thinning 4.  Chemo-induced peripheral neuropathy: I decrease the dosage of Taxol down to 50 mg/m today.  Previous problems with carpal tunnel syndrome. She plans to go to [employer] in October. She would like to receive radiation at [facility].  We will send a referral to Dr. Pablo Ledger for adjuvant radiation therapy.  Patient will receive Herceptin maintenance every 3 weeks I will see the patient every 6 weeks with labs.

## 2018-07-28 NOTE — Progress Notes (Signed)
Patient Care Team: [name], MD as PCP - General (Family Medicine) Mauro Kaufmann, RN as Oncology Nurse Navigator Rockwell Germany, RN as Oncology Nurse Navigator  DIAGNOSIS:    ICD-10-CM   1. Malignant neoplasm of lower-outer quadrant of right breast of female, estrogen receptor positive ([place])  C50.511    Z17.0     SUMMARY OF ONCOLOGIC HISTORY: Oncology History  Malignant neoplasm of lower-outer quadrant of right breast of female, estrogen receptor positive ([place])  03/06/2018 Initial Diagnosis   Screening mammogram detected right breast calcifications, 8 mm mass detected by ultrasound, axilla negative, biopsy revealed grade 2 IDC with high-grade DCIS, ER 100%, PR 1%, Ki-67 30%, HER-2 +3+ by IHC, T1BN0 stage Ia   03/19/2018 Cancer Staging   Staging form: Breast, AJCC 8th Edition - Clinical stage from 03/19/2018: Stage IA (cT1b, cN0, cM0, G2, ER+, PR+, HER2+) - Signed by Nicholas Lose, MD on 03/19/2018   03/24/2018 Surgery   Right lumpectomy [name]): IDC with DCIS, grade 3, 0.6cm, ER+ (100%), PR+ (1%), HER2+ (3+), Ki67 30%, clear margins, 0.4 SLN negative for carcinoma.    05/14/2018 -  Chemotherapy   The patient had trastuzumab (HERCEPTIN) 300 mg in sodium chloride 0.9 % 250 mL chemo infusion, 273 mg, Intravenous,  Once, 3 of 16 cycles Dose modification: 6 mg/kg (original dose 2 mg/kg, Cycle 3, Reason: Other (see comments), Comment: switch to maintenance Herceptin - every 3 weeks) Administration: 300 mg (05/14/2018), 150 mg (05/20/2018), 150 mg (06/10/2018), 147 mg (05/27/2018), 147 mg (06/03/2018), 150 mg (06/17/2018), 150 mg (06/24/2018), 150 mg (07/01/2018), 150 mg (07/08/2018), 150 mg (07/15/2018), 150 mg (07/22/2018) PACLitaxel (TAXOL) 138 mg in sodium chloride 0.9 % 250 mL chemo infusion (</= 14m/m2), 80 mg/m2 = 138 mg, Intravenous,  Once, 3 of 3 cycles Dose modification: 65 mg/m2 (original dose 80 mg/m2, Cycle 2, Reason: Dose not tolerated), 50 mg/m2 (original dose 80 mg/m2, Cycle 3,  Reason: Dose not tolerated) Administration: 138 mg (05/14/2018), 138 mg (05/20/2018), 114 mg (06/10/2018), 114 mg (05/27/2018), 114 mg (06/03/2018), 114 mg (06/17/2018), 114 mg (06/24/2018), 114 mg (07/01/2018), 114 mg (07/08/2018), 90 mg (07/15/2018), 90 mg (07/22/2018)  for chemotherapy treatment.      CHIEF COMPLIANT: Cycle12Taxol Herceptin  INTERVAL HISTORY: Kim Montgomery a 58y.o. with above-mentioned history of right breast cancer who underwent a lumpectomyandis currently onadjuvant chemotherapy with weekly Taxol and Herceptin.She presents to the clinic todayfor cycle12.   REVIEW OF SYSTEMS:   Constitutional: Denies fevers, chills or abnormal weight loss Eyes: Denies blurriness of vision Ears, nose, mouth, throat, and face: Denies mucositis or sore throat Respiratory: Denies cough, dyspnea or wheezes Cardiovascular: Denies palpitation, chest discomfort Gastrointestinal: Denies nausea, heartburn or change in bowel habits Skin: Denies abnormal skin rashes Lymphatics: Denies new lymphadenopathy or easy bruising Neurological: Denies numbness, tingling or new weaknesses Behavioral/Psych: Mood is stable, no new changes  Extremities: No lower extremity edema Breast: denies any pain or lumps or nodules in either breasts All other systems were reviewed with the patient and are negative.  I have reviewed the past medical history, past surgical history, social history and family history with the patient and they are unchanged from previous note.  ALLERGIES:  has No Known Allergies.  MEDICATIONS:  Current Outpatient Medications  Medication Sig Dispense Refill  . albuterol (PROVENTIL HFA;VENTOLIN HFA) 108 (90 Base) MCG/ACT inhaler Inhale into the lungs every 6 (six) hours as needed for wheezing or shortness of breath.    .Marland Kitchenatenolol-chlorthalidone (TENORETIC) 100-25  MG tablet Take 1 tablet by mouth daily.    . clonazePAM (KLONOPIN) 0.5 MG tablet Take 0.5 mg by mouth at bedtime.    .  lidocaine (XYLOCAINE) 2 % solution Use as directed 5 mLs in the mouth or throat every 3 (three) hours as needed for mouth pain. Swish and spit 200 mL 2  . lidocaine-prilocaine (EMLA) cream Apply to affected area once 30 g 3  . magic mouthwash SOLN Take 5 mLs by mouth 4 (four) times daily as needed for mouth pain. 240 mL 3  . Melatonin 10 MG TABS Take by mouth.    [name] omeprazole (PRILOSEC) 20 MG capsule Take 20 mg by mouth daily.    . ondansetron (ZOFRAN) 8 MG tablet Take 1 tablet (8 mg total) by mouth 2 (two) times daily as needed (Nausea or vomiting). 30 tablet 1  . Oral Wound Care Products [facility]) LIQD Use as directed 5 mLs in the mouth or throat 6 (six) times daily. 240 mL 3  . potassium chloride SA (K-DUR,KLOR-CON) 20 MEQ tablet Take 20 mEq by mouth 2 (two) times daily.    . prochlorperazine (COMPAZINE) 10 MG tablet Take 1 tablet (10 mg total) by mouth every 6 (six) hours as needed (Nausea or vomiting). 30 tablet 1   No current facility-administered medications for this visit.     PHYSICAL EXAMINATION: ECOG PERFORMANCE STATUS: 1 - Symptomatic but completely ambulatory  Vitals:   07/29/18 1124  BP: 115/65  Pulse: 75  Resp: 18  Temp: 98.3 F (36.8 C)  SpO2: 99%   Filed Weights   07/29/18 1124  Weight: 158 lb (71.7 kg)    GENERAL: alert, no distress and comfortable SKIN: skin color, texture, turgor are normal, no rashes or significant lesions EYES: normal, Conjunctiva are pink and non-injected, sclera clear OROPHARYNX: no exudate, no erythema and lips, buccal mucosa, and tongue normal  NECK: supple, thyroid normal size, non-tender, without nodularity LYMPH: no palpable lymphadenopathy in the cervical, axillary or inguinal LUNGS: clear to auscultation and percussion with normal breathing effort HEART: regular rate & rhythm and no murmurs and no lower extremity edema ABDOMEN: abdomen soft, non-tender and normal bowel sounds MUSCULOSKELETAL: no cyanosis of digits and no clubbing   NEURO: alert & oriented x 3 with fluent speech, no focal motor/sensory deficits EXTREMITIES: No lower extremity edema  LABORATORY DATA:  I have reviewed the data as listed CMP Latest Ref Rng & Units 07/22/2018 07/15/2018 07/08/2018  Glucose 70 - 99 mg/dL 99 96 99  BUN 6 - 20 mg/dL '14 16 13  ' Creatinine 0.44 - 1.00 mg/dL 0.77 0.75 0.77  Sodium 135 - 145 mmol/L 137 139 138  Potassium 3.5 - 5.1 mmol/L 3.9 3.5 3.8  Chloride 98 - 111 mmol/L 101 103 103  CO2 22 - 32 mmol/L '27 27 27  ' Calcium 8.9 - 10.3 mg/dL 9.4 9.0 9.2  Total Protein 6.5 - 8.1 g/dL 6.8 7.1 7.1  Total Bilirubin 0.3 - 1.2 mg/dL 0.6 0.6 0.5  Alkaline Phos 38 - 126 U/L 62 66 68  AST 15 - 41 U/L '29 29 28  ' ALT 0 - 44 U/L '24 27 26    ' Lab Results  Component Value Date   WBC 5.5 07/29/2018   HGB 10.2 (L) 07/29/2018   HCT 30.2 (L) 07/29/2018   MCV 94.1 07/29/2018   PLT 248 07/29/2018   NEUTROABS 3.5 07/29/2018    ASSESSMENT & PLAN:  Malignant neoplasm of lower-outer quadrant of right breast of female, estrogen  receptor positive ([place]) 03/06/2018:Screening mammogram detected right breast calcifications, 8 mm mass detected by ultrasound, axilla negative, 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 Adj Chemo with Taxol-Herceptin weekly X 11 (stopped early for neuropathy) followed by Herceptin q 3 weeks for a year. 2. Adj RT 3. Foll by Adj Anti estrogen therapy. ------------------------------------------------------------------------------------------------------------------------------------------- Current treatment:  Herceptin Labs reviewed Echocardiogram: EF 60 to 65%  Chemotoxicities: 1.Chemo induced anemia: Monitoringtoday's hemoglobin is 10.2. 2.Mouth sores that resolved with ice 3.Hair loss 4.  Chemo-induced peripheral  neuropathy: We will discontinue cycle 12 of Taxol because of worsening peripheral neuropathy and pain in the knuckles.  Previous problems with carpal tunnel syndrome. She plans to go to [employer] in October. She would like to receive radiation at [facility] with Dr. Pablo Ledger for adjuvant radiation therapy.  Patient will receive Herceptin maintenance every 3 weeks I will see the patient every 6 weeks with labs.    No orders of the defined types were placed in this encounter.  The patient has a good understanding of the overall plan. she agrees with it. she will call with any problems that may develop before the next visit here.  Nicholas Lose, MD 07/29/2018  Julious Oka Dorshimer am acting as scribe for Dr. Nicholas Lose.  I have reviewed the above documentation for accuracy and completeness, and I agree with the above.

## 2018-07-29 ENCOUNTER — Other Ambulatory Visit: Payer: Self-pay

## 2018-07-29 ENCOUNTER — Inpatient Hospital Stay: Payer: Managed Care, Other (non HMO)

## 2018-07-29 ENCOUNTER — Inpatient Hospital Stay (HOSPITAL_BASED_OUTPATIENT_CLINIC_OR_DEPARTMENT_OTHER): Payer: Managed Care, Other (non HMO) | Admitting: Hematology and Oncology

## 2018-07-29 ENCOUNTER — Telehealth: Payer: Self-pay | Admitting: *Deleted

## 2018-07-29 DIAGNOSIS — Z5112 Encounter for antineoplastic immunotherapy: Secondary | ICD-10-CM | POA: Diagnosis not present

## 2018-07-29 DIAGNOSIS — C50511 Malignant neoplasm of lower-outer quadrant of right female breast: Secondary | ICD-10-CM | POA: Diagnosis not present

## 2018-07-29 DIAGNOSIS — L658 Other specified nonscarring hair loss: Secondary | ICD-10-CM

## 2018-07-29 DIAGNOSIS — Z79899 Other long term (current) drug therapy: Secondary | ICD-10-CM

## 2018-07-29 DIAGNOSIS — Z170 Estrogen receptor positive status [ER+]: Secondary | ICD-10-CM | POA: Diagnosis not present

## 2018-07-29 DIAGNOSIS — T451X5A Adverse effect of antineoplastic and immunosuppressive drugs, initial encounter: Secondary | ICD-10-CM

## 2018-07-29 DIAGNOSIS — G5600 Carpal tunnel syndrome, unspecified upper limb: Secondary | ICD-10-CM

## 2018-07-29 DIAGNOSIS — D6481 Anemia due to antineoplastic chemotherapy: Secondary | ICD-10-CM

## 2018-07-29 DIAGNOSIS — Z5111 Encounter for antineoplastic chemotherapy: Secondary | ICD-10-CM

## 2018-07-29 DIAGNOSIS — Z95828 Presence of other vascular implants and grafts: Secondary | ICD-10-CM

## 2018-07-29 DIAGNOSIS — G620 Drug-induced polyneuropathy: Secondary | ICD-10-CM

## 2018-07-29 LAB — CBC WITH DIFFERENTIAL (CANCER CENTER ONLY)
Abs Immature Granulocytes: 0.08 10*3/uL — ABNORMAL HIGH (ref 0.00–0.07)
Basophils Absolute: 0 10*3/uL (ref 0.0–0.1)
Basophils Relative: 1 %
Eosinophils Absolute: 0.1 10*3/uL (ref 0.0–0.5)
Eosinophils Relative: 2 %
HCT: 30.2 % — ABNORMAL LOW (ref 36.0–46.0)
Hemoglobin: 10.2 g/dL — ABNORMAL LOW (ref 12.0–15.0)
Immature Granulocytes: 1 %
Lymphocytes Relative: 24 %
Lymphs Abs: 1.3 10*3/uL (ref 0.7–4.0)
MCH: 31.8 pg (ref 26.0–34.0)
MCHC: 33.8 g/dL (ref 30.0–36.0)
MCV: 94.1 fL (ref 80.0–100.0)
Monocytes Absolute: 0.5 10*3/uL (ref 0.1–1.0)
Monocytes Relative: 9 %
Neutro Abs: 3.5 10*3/uL (ref 1.7–7.7)
Neutrophils Relative %: 63 %
Platelet Count: 248 10*3/uL (ref 150–400)
RBC: 3.21 MIL/uL — ABNORMAL LOW (ref 3.87–5.11)
RDW: 16.7 % — ABNORMAL HIGH (ref 11.5–15.5)
WBC Count: 5.5 10*3/uL (ref 4.0–10.5)
nRBC: 0 % (ref 0.0–0.2)

## 2018-07-29 LAB — CMP (CANCER CENTER ONLY)
ALT: 23 U/L (ref 0–44)
AST: 30 U/L (ref 15–41)
Albumin: 3.6 g/dL (ref 3.5–5.0)
Alkaline Phosphatase: 66 U/L (ref 38–126)
Anion gap: 7 (ref 5–15)
BUN: 16 mg/dL (ref 6–20)
CO2: 27 mmol/L (ref 22–32)
Calcium: 9.2 mg/dL (ref 8.9–10.3)
Chloride: 103 mmol/L (ref 98–111)
Creatinine: 0.77 mg/dL (ref 0.44–1.00)
GFR, Est AFR Am: >60 mL/min (ref >60)
GFR, Estimated: >60 mL/min (ref >60)
Glucose, Bld: 92 mg/dL (ref 70–99)
Potassium: 3.7 mmol/L (ref 3.5–5.1)
Sodium: 137 mmol/L (ref 135–145)
Total Bilirubin: 0.5 mg/dL (ref 0.3–1.2)
Total Protein: 6.8 g/dL (ref 6.5–8.1)

## 2018-07-29 MED ORDER — ACETAMINOPHEN 325 MG PO TABS
650.0000 mg | ORAL_TABLET | Freq: Once | ORAL | Status: AC
  Administered 2018-07-29: 650 mg via ORAL

## 2018-07-29 MED ORDER — SODIUM CHLORIDE 0.9% FLUSH
10.0000 mL | INTRAVENOUS | Status: DC | PRN
  Administered 2018-07-29: 10 mL
  Filled 2018-07-29: qty 10

## 2018-07-29 MED ORDER — HEPARIN SOD (PORK) LOCK FLUSH 100 UNIT/ML IV SOLN
500.0000 [IU] | Freq: Once | INTRAVENOUS | Status: AC | PRN
  Administered 2018-07-29: 500 [IU]
  Filled 2018-07-29: qty 5

## 2018-07-29 MED ORDER — TRASTUZUMAB CHEMO 150 MG IV SOLR: 6.0000 mg/kg | Freq: Once | INTRAVENOUS | Status: DC

## 2018-07-29 MED ORDER — SODIUM CHLORIDE 0.9 % IV SOLN
Freq: Once | INTRAVENOUS | Status: AC
  Administered 2018-07-29: 12:00:00 via INTRAVENOUS
  Filled 2018-07-29: qty 250

## 2018-07-29 MED ORDER — DIPHENHYDRAMINE HCL 50 MG/ML IJ SOLN
25.0000 mg | Freq: Once | INTRAMUSCULAR | Status: AC
  Administered 2018-07-29: 25 mg via INTRAVENOUS

## 2018-07-29 MED ORDER — DIPHENHYDRAMINE HCL 50 MG/ML IJ SOLN
INTRAMUSCULAR | Status: AC
  Filled 2018-07-29: qty 1

## 2018-07-29 MED ORDER — ACETAMINOPHEN 325 MG PO TABS
ORAL_TABLET | ORAL | Status: AC
  Filled 2018-07-29: qty 2

## 2018-07-29 MED ORDER — TRASTUZUMAB CHEMO 150 MG IV SOLR
450.0000 mg | Freq: Once | INTRAVENOUS | Status: AC
  Administered 2018-07-29: 450 mg via INTRAVENOUS
  Filled 2018-07-29: qty 21.43

## 2018-07-29 NOTE — Progress Notes (Signed)
PT complained of pain around incision site so I made the dressing a little looser and she said that helped.

## 2018-07-29 NOTE — Patient Instructions (Signed)

## 2018-07-29 NOTE — Patient Instructions (Signed)
[facility] Discharge Instructions for Patients Receiving Chemotherapy  Today you received the following chemotherapy agents Herceptin  To help prevent nausea and vomiting after your treatment, we encourage you to take your nausea medication as directed   If you develop nausea and vomiting that is not controlled by your nausea medication, call the clinic.   BELOW ARE SYMPTOMS THAT SHOULD BE REPORTED IMMEDIATELY:  *FEVER GREATER THAN 100.5 F  *CHILLS WITH OR WITHOUT FEVER  NAUSEA AND VOMITING THAT IS NOT CONTROLLED WITH YOUR NAUSEA MEDICATION  *UNUSUAL SHORTNESS OF BREATH  *UNUSUAL BRUISING OR BLEEDING  TENDERNESS IN MOUTH AND THROAT WITH OR WITHOUT PRESENCE OF ULCERS  *URINARY PROBLEMS  *BOWEL PROBLEMS  UNUSUAL RASH Items with * indicate a potential emergency and should be followed up as soon as possible.  Feel free to call the clinic should you have any questions or concerns. The clinic phone number is [phone].  Please show the CHEMO ALERT CARD at check-in to the Emergency Department and triage nurse.   

## 2018-07-29 NOTE — Telephone Encounter (Signed)
Called pt to congratulate on completion of final chemo later today. Denies question regarding tx plan.  Pt scheduled to see Dr. Pablo Ledger for xrt consult on 8/3. Informed pt to call and inform when her final xrt treatment is scheduled as we will get her back in to see Dr. Lindi Adie to discuss tamoxifen. Received verbal understanding.

## 2018-07-30 NOTE — Progress Notes (Signed)
The following biosimilar Ogivri (trastuzumab-dkst) has been selected for use in this patient.  Kennith Center, Pharm.D., CPP 07/30/2018@4 :44 PM

## 2018-08-05 ENCOUNTER — Ambulatory Visit: Payer: Managed Care, Other (non HMO) | Admitting: Physical Therapy

## 2018-08-05 ENCOUNTER — Encounter: Payer: Self-pay | Admitting: *Deleted

## 2018-08-11 ENCOUNTER — Ambulatory Visit (HOSPITAL_COMMUNITY)
Admission: RE | Admit: 2018-08-11 | Discharge: 2018-08-11 | Disposition: A | Discharge status: Home / Self Care | Payer: Managed Care, Other (non HMO) | Source: Ambulatory Visit | Attending: Hematology and Oncology | Admitting: Hematology and Oncology

## 2018-08-11 ENCOUNTER — Other Ambulatory Visit: Payer: Self-pay

## 2018-08-11 DIAGNOSIS — Z170 Estrogen receptor positive status [ER+]: Secondary | ICD-10-CM | POA: Insufficient documentation

## 2018-08-11 DIAGNOSIS — I10 Essential (primary) hypertension: Secondary | ICD-10-CM | POA: Insufficient documentation

## 2018-08-11 DIAGNOSIS — C50511 Malignant neoplasm of lower-outer quadrant of right female breast: Secondary | ICD-10-CM | POA: Insufficient documentation

## 2018-08-11 NOTE — Progress Notes (Signed)
  Echocardiogram 2D Echocardiogram has been performed.  Jannett Celestine 08/11/2018, 11:03 AM

## 2018-08-18 ENCOUNTER — Inpatient Hospital Stay: Payer: Managed Care, Other (non HMO) | Attending: Hematology and Oncology

## 2018-08-18 ENCOUNTER — Other Ambulatory Visit: Payer: Self-pay

## 2018-08-18 VITALS — BP 120/70 | HR 70 | Temp 98.6°F | Resp 20 | Wt 156.2 lb

## 2018-08-18 DIAGNOSIS — C50511 Malignant neoplasm of lower-outer quadrant of right female breast: Secondary | ICD-10-CM | POA: Insufficient documentation

## 2018-08-18 DIAGNOSIS — Z170 Estrogen receptor positive status [ER+]: Secondary | ICD-10-CM | POA: Diagnosis not present

## 2018-08-18 DIAGNOSIS — Z5112 Encounter for antineoplastic immunotherapy: Secondary | ICD-10-CM | POA: Insufficient documentation

## 2018-08-18 MED ORDER — SODIUM CHLORIDE 0.9% FLUSH
10.0000 mL | INTRAVENOUS | Status: DC | PRN
  Administered 2018-08-18: 10 mL
  Filled 2018-08-18: qty 10

## 2018-08-18 MED ORDER — HEPARIN SOD (PORK) LOCK FLUSH 100 UNIT/ML IV SOLN
500.0000 [IU] | Freq: Once | INTRAVENOUS | Status: AC | PRN
  Administered 2018-08-18: 12:00:00 500 [IU]
  Filled 2018-08-18: qty 5

## 2018-08-18 MED ORDER — SODIUM CHLORIDE 0.9 % IV SOLN
Freq: Once | INTRAVENOUS | Status: AC
  Administered 2018-08-18: 10:00:00 via INTRAVENOUS
  Filled 2018-08-18: qty 250

## 2018-08-18 MED ORDER — ACETAMINOPHEN 325 MG PO TABS
650.0000 mg | ORAL_TABLET | Freq: Once | ORAL | Status: AC
  Administered 2018-08-18: 10:00:00 650 mg via ORAL

## 2018-08-18 MED ORDER — DIPHENHYDRAMINE HCL 25 MG PO CAPS
50.0000 mg | ORAL_CAPSULE | Freq: Once | ORAL | Status: AC
  Administered 2018-08-18: 10:00:00 50 mg via ORAL

## 2018-08-18 MED ORDER — TRASTUZUMAB-DKST CHEMO 150 MG IV SOLR: 450.0000 mg | Freq: Once | INTRAVENOUS | Status: DC

## 2018-08-18 MED ORDER — TRASTUZUMAB CHEMO 150 MG IV SOLR
450.0000 mg | Freq: Once | INTRAVENOUS | Status: AC
  Administered 2018-08-18: 11:00:00 450 mg via INTRAVENOUS
  Filled 2018-08-18: qty 21.43

## 2018-08-18 MED ORDER — ACETAMINOPHEN 325 MG PO TABS
ORAL_TABLET | ORAL | Status: AC
  Filled 2018-08-18: qty 2

## 2018-08-18 MED ORDER — DIPHENHYDRAMINE HCL 25 MG PO CAPS
ORAL_CAPSULE | ORAL | Status: AC
  Filled 2018-08-18: qty 2

## 2018-08-18 NOTE — Patient Instructions (Signed)
[facility] Discharge Instructions for Patients Receiving Chemotherapy  Today you received the following chemotherapy agents Trastuzumab-dkst (OGIVRI).  To help prevent nausea and vomiting after your treatment, we encourage you to take your nausea medication as prescribed.   If you develop nausea and vomiting that is not controlled by your nausea medication, call the clinic.   BELOW ARE SYMPTOMS THAT SHOULD BE REPORTED IMMEDIATELY:  *FEVER GREATER THAN 100.5 F  *CHILLS WITH OR WITHOUT FEVER  NAUSEA AND VOMITING THAT IS NOT CONTROLLED WITH YOUR NAUSEA MEDICATION  *UNUSUAL SHORTNESS OF BREATH  *UNUSUAL BRUISING OR BLEEDING  TENDERNESS IN MOUTH AND THROAT WITH OR WITHOUT PRESENCE OF ULCERS  *URINARY PROBLEMS  *BOWEL PROBLEMS  UNUSUAL RASH Items with * indicate a potential emergency and should be followed up as soon as possible.  Feel free to call the clinic should you have any questions or concerns. The clinic phone number is [phone].  Please show the CHEMO ALERT CARD at check-in to the Emergency Department and triage nurse.  Coronavirus (COVID-19) Are you at risk?  Are you at risk for the Coronavirus (COVID-19)?  To be considered HIGH RISK for Coronavirus (COVID-19), you have to meet the following criteria:  . Traveled to [place], [place], [place], [place] or [place]; or in the [place] to [place], [place], [place], or [place]; and have fever, cough, and shortness of breath within the last 2 weeks of travel OR . Been in close contact with a person diagnosed with COVID-19 within the last 2 weeks and have fever, cough, and shortness of breath . IF YOU DO NOT MEET THESE CRITERIA, YOU ARE CONSIDERED LOW RISK FOR COVID-19.  What to do if you are HIGH RISK for COVID-19?  . If you are having a medical emergency, call 911. . Seek medical care right away. Before you go to a doctor's office, urgent care or emergency department, call ahead and tell  them about your recent travel, contact with someone diagnosed with COVID-19, and your symptoms. You should receive instructions from your physician's office regarding next steps of care.  . When you arrive at healthcare provider, tell the healthcare staff immediately you have returned from visiting [place], [place], [place], [place] or [place]; or traveled in the [place] to [place], [place], [place], or [place]; in the last two weeks or you have been in close contact with a person diagnosed with COVID-19 in the last 2 weeks.   . Tell the health care staff about your symptoms: fever, cough and shortness of breath. . After you have been seen by a medical provider, you will be either: o Tested for (COVID-19) and discharged home on quarantine except to seek medical care if symptoms worsen, and asked to  - Stay home and avoid contact with others until you get your results (4-5 days)  - Avoid travel on public transportation if possible (such as bus, train, or airplane) or o Sent to the Emergency Department by EMS for evaluation, COVID-19 testing, and possible admission depending on your condition and test results.  What to do if you are LOW RISK for COVID-19?  Reduce your risk of any infection by using the same precautions used for avoiding the common cold or flu:  . Wash your hands often with soap and warm water for at least 20 seconds.  If soap and water are not readily available, use an alcohol-based hand sanitizer with at least 60% alcohol.  . If coughing or   sneezing, cover your mouth and nose by coughing or sneezing into the elbow areas of your shirt or coat, into a tissue or into your sleeve (not your hands). . Avoid shaking hands with others and consider head nods or verbal greetings only. . Avoid touching your eyes, nose, or mouth with unwashed hands.  . Avoid close contact with people who are sick. . Avoid places or events with large numbers of people in one location, like concerts or  sporting events. . Carefully consider travel plans you have or are making. . If you are planning any travel outside or inside the [place], visit the CDC's Travelers' Health webpage for the latest health notices. . If you have some symptoms but not all symptoms, continue to monitor at home and seek medical attention if your symptoms worsen. . If you are having a medical emergency, call 911.   ADDITIONAL HEALTHCARE OPTIONS FOR PATIENTS  [facility] / e-Visit: [email or web address]         [facility]: [phone]  [facility]: [phone]                   [facility]: [phone]   

## 2018-08-19 ENCOUNTER — Encounter: Payer: Self-pay | Admitting: *Deleted

## 2018-09-07 NOTE — Progress Notes (Signed)
Patient Care Team: [name], MD as PCP - General (Family Medicine) Mauro Kaufmann, RN as Oncology Nurse Navigator Rockwell Germany, RN as Oncology Nurse Navigator  DIAGNOSIS:    ICD-10-CM   1. Malignant neoplasm of lower-outer quadrant of right breast of female, estrogen receptor positive ([place])  C50.511    Z17.0     SUMMARY OF ONCOLOGIC HISTORY: Oncology History  Malignant neoplasm of lower-outer quadrant of right breast of female, estrogen receptor positive ([place])  03/06/2018 Initial Diagnosis   Screening mammogram detected right breast calcifications, 8 mm mass detected by ultrasound, axilla negative, biopsy revealed grade 2 IDC with high-grade DCIS, ER 100%, PR 1%, Ki-67 30%, HER-2 +3+ by IHC, T1BN0 stage Ia   03/19/2018 Cancer Staging   Staging form: Breast, AJCC 8th Edition - Clinical stage from 03/19/2018: Stage IA (cT1b, cN0, cM0, G2, ER+, PR+, HER2+) - Signed by Nicholas Lose, MD on 03/19/2018   03/24/2018 Surgery   Right lumpectomy [name]): IDC with DCIS, grade 3, 0.6cm, ER+ (100%), PR+ (1%), HER2+ (3+), Ki67 30%, clear margins, 0.4 SLN negative for carcinoma.    05/14/2018 -  Chemotherapy   The patient had trastuzumab (HERCEPTIN) 300 mg in sodium chloride 0.9 % 250 mL chemo infusion, 273 mg, Intravenous,  Once, 4 of 4 cycles Dose modification: 6 mg/kg (original dose 2 mg/kg, Cycle 3, Reason: Other (see comments), Comment: switch to maintenance Herceptin - every 3 weeks), 6 mg/kg (original dose 6 mg/kg, Cycle 4, Reason: Other (see comments), Comment: bios not approved, herc again today as it is appr) Administration: 300 mg (05/14/2018), 150 mg (05/20/2018), 150 mg (06/10/2018), 147 mg (05/27/2018), 147 mg (06/03/2018), 150 mg (06/17/2018), 150 mg (06/24/2018), 150 mg (07/01/2018), 150 mg (07/08/2018), 150 mg (07/15/2018), 150 mg (07/22/2018), 450 mg (07/29/2018), 450 mg (08/18/2018) PACLitaxel (TAXOL) 138 mg in sodium chloride 0.9 % 250 mL chemo infusion (</= 86m/m2), 80 mg/m2 = 138 mg,  Intravenous,  Once, 3 of 3 cycles Dose modification: 65 mg/m2 (original dose 80 mg/m2, Cycle 2, Reason: Dose not tolerated), 50 mg/m2 (original dose 80 mg/m2, Cycle 3, Reason: Dose not tolerated) Administration: 138 mg (05/14/2018), 138 mg (05/20/2018), 114 mg (06/10/2018), 114 mg (05/27/2018), 114 mg (06/03/2018), 114 mg (06/17/2018), 114 mg (06/24/2018), 114 mg (07/01/2018), 114 mg (07/08/2018), 90 mg (07/15/2018), 90 mg (07/22/2018) trastuzumab-dkst (OGIVRI) 450 mg in sodium chloride 0.9 % 250 mL chemo infusion, 450 mg (100 % of original dose 450 mg), Intravenous,  Once, 1 of 13 cycles Dose modification: 450 mg (original dose 450 mg, Cycle 4, Reason: Other (see comments), Comment: Biosimilar Conversion)  for chemotherapy treatment.    08/2018 -  Radiation Therapy   [facility]     CHIEF COMPLIANT: Herceptin maintenance  INTERVAL HISTORY: Kim Meyerhoffis a 58y.o. with above-mentioned history of right breast cancer who underwent a lumpectomy, adjuvant chemotherapy, and [facility] She is currently on Herceptin maintenance. She presents to the clinic today for treatment.   REVIEW OF SYSTEMS:   Constitutional: Denies fevers, chills or abnormal weight loss Eyes: Denies blurriness of vision Ears, nose, mouth, throat, and face: Denies mucositis or sore throat Respiratory: Denies cough, dyspnea or wheezes Cardiovascular: Denies palpitation, chest discomfort Gastrointestinal: Denies nausea, heartburn or change in bowel habits Skin: Denies abnormal skin rashes Lymphatics: Denies new lymphadenopathy or easy bruising Neurological: Denies numbness, tingling or new weaknesses Behavioral/Psych: Mood is stable, no new changes  Extremities: No lower extremity  edema Breast: denies any pain or lumps or nodules in either breasts All other systems were reviewed with the patient and are negative.  I have reviewed the past medical history, past surgical  history, social history and family history with the patient and they are unchanged from previous note.  ALLERGIES:  has No Known Allergies.  MEDICATIONS:  Current Outpatient Medications  Medication Sig Dispense Refill  . albuterol (PROVENTIL HFA;VENTOLIN HFA) 108 (90 Base) MCG/ACT inhaler Inhale into the lungs every 6 (six) hours as needed for wheezing or shortness of breath.    [name] atenolol-chlorthalidone (TENORETIC) 100-25 MG tablet Take 1 tablet by mouth daily.    . clonazePAM (KLONOPIN) 0.5 MG tablet Take 0.5 mg by mouth at bedtime.    . lidocaine (XYLOCAINE) 2 % solution Use as directed 5 mLs in the mouth or throat every 3 (three) hours as needed for mouth pain. Swish and spit 200 mL 2  . lidocaine-prilocaine (EMLA) cream Apply to affected area once 30 g 3  . magic mouthwash SOLN Take 5 mLs by mouth 4 (four) times daily as needed for mouth pain. 240 mL 3  . Melatonin 10 MG TABS Take by mouth.    [name] omeprazole (PRILOSEC) 20 MG capsule Take 20 mg by mouth daily.    . ondansetron (ZOFRAN) 8 MG tablet Take 1 tablet (8 mg total) by mouth 2 (two) times daily as needed (Nausea or vomiting). 30 tablet 1  . Oral Wound Care Products [facility]) LIQD Use as directed 5 mLs in the mouth or throat 6 (six) times daily. 240 mL 3  . potassium chloride SA (K-DUR,KLOR-CON) 20 MEQ tablet Take 20 mEq by mouth 2 (two) times daily.    . prochlorperazine (COMPAZINE) 10 MG tablet Take 1 tablet (10 mg total) by mouth every 6 (six) hours as needed (Nausea or vomiting). 30 tablet 1   No current facility-administered medications for this visit.     PHYSICAL EXAMINATION: ECOG PERFORMANCE STATUS: 1 - Symptomatic but completely ambulatory  Vitals:   09/09/18 1109  BP: 121/61  Pulse: 68  Resp: 18  Temp: 98.2 F (36.8 C)  SpO2: 99%   Filed Weights   09/09/18 1109  Weight: 157 lb 12.8 oz (71.6 kg)    GENERAL: alert, no distress and comfortable SKIN: skin color, texture, turgor are normal, no rashes or  significant lesions EYES: normal, Conjunctiva are pink and non-injected, sclera clear OROPHARYNX: no exudate, no erythema and lips, buccal mucosa, and tongue normal  NECK: supple, thyroid normal size, non-tender, without nodularity LYMPH: no palpable lymphadenopathy in the cervical, axillary or inguinal LUNGS: clear to auscultation and percussion with normal breathing effort HEART: regular rate & rhythm and no murmurs and no lower extremity edema ABDOMEN: abdomen soft, non-tender and normal bowel sounds MUSCULOSKELETAL: no cyanosis of digits and no clubbing  NEURO: alert & oriented x 3 with fluent speech, no focal motor/sensory deficits EXTREMITIES: No lower extremity edema  LABORATORY DATA:  I have reviewed the data as listed CMP Latest Ref Rng & Units 09/09/2018 07/29/2018 07/22/2018  Glucose 70 - 99 mg/dL 96 92 99  BUN 6 - 20 mg/dL _0 Creatinine 0.44 - 1.00 mg/dL 0.78 0.77 0.77  Sodium 135 - 145 mmol/L 138 137 137  Potassium 3.5 - 5.1 mmol/L 3.8 3.7 3.9  Chloride 98 - 111 mmol/L 103 103 101  CO2 22 - 32 mmol/L _1 Calcium 8.9 - 10.3 mg/dL 9.5 9.2 9.4  Total Protein 6.5 -  8.1 g/dL 7.2 6.8 6.8  Total Bilirubin 0.3 - 1.2 mg/dL 0.5 0.5 0.6  Alkaline Phos 38 - 126 U/L 74 66 62  AST 15 - 41 U/L _0 ALT 0 - 44 U/L _1 Lab Results  Component Value Date   WBC 5.9 09/09/2018   HGB 12.3 09/09/2018   HCT 36.8 09/09/2018   MCV 93.6 09/09/2018   PLT 177 09/09/2018   NEUTROABS 3.7 09/09/2018    ASSESSMENT & PLAN:  Malignant neoplasm of lower-outer quadrant of right breast of female, estrogen receptor positive ([place]) 03/06/2018:Screening mammogram detected right breast calcifications, 8 mm mass detected by ultrasound, axilla negative, biopsy revealed grade 2 IDC with high-grade DCIS, ER 100%, PR 1%, Ki-67 30%, HER-2 +3+ by IHC, T1BN0 stage Ia  3.23.20: Rt Lumpectomy Grade 2 IDC 0.6 cm, 0/4 LN Neg, Er 100%, PR 1%, Her 2: 3+ pos, T1bN0 Stage 1A  Treatment plan:  1. Adj Chemo with Taxol-Herceptin weekly X 11 (stopped early for neuropathy) followed by Herceptin q 3 weeks for a year. 2. Adj RT 3. Foll by Adj Anti estrogen therapy. ------------------------------------------------------------------------------------------------------------------------------------------- Current treatment:  Herceptin maintenance which will be completed in May 2021. Labs reviewed: All her blood counts have returned back to normal. Echocardiogram: EF 60 to 65% Chemo-induced peripheral neuropathy: Mild to moderate.  It is not bothering her at night.  We will observe it for now.  Previous problems with carpal tunnel syndrome. She plans to go to [place] in October. Currently receiving radiation at [facility] with Dr. Pablo Ledger for adjuvant radiation therapy.  Patient will receive Herceptin maintenance every 3 weeks I will see the patient every 6 weeks with labs.    No orders of the defined types were placed in this encounter.  The patient has a good understanding of the overall plan. she agrees with it. she will call with any problems that may develop before the next visit here.  Nicholas Lose, MD 09/09/2018  Julious Oka Dorshimer am acting as scribe for Dr. Nicholas Lose.  I have reviewed the above documentation for accuracy and completeness, and I agree with the above.

## 2018-09-09 ENCOUNTER — Inpatient Hospital Stay: Payer: Managed Care, Other (non HMO)

## 2018-09-09 ENCOUNTER — Inpatient Hospital Stay: Payer: Managed Care, Other (non HMO) | Attending: Hematology and Oncology

## 2018-09-09 ENCOUNTER — Inpatient Hospital Stay (HOSPITAL_BASED_OUTPATIENT_CLINIC_OR_DEPARTMENT_OTHER): Payer: Managed Care, Other (non HMO) | Admitting: Hematology and Oncology

## 2018-09-09 ENCOUNTER — Other Ambulatory Visit: Payer: Self-pay

## 2018-09-09 DIAGNOSIS — Z170 Estrogen receptor positive status [ER+]: Secondary | ICD-10-CM | POA: Diagnosis not present

## 2018-09-09 DIAGNOSIS — Z923 Personal history of irradiation: Secondary | ICD-10-CM | POA: Diagnosis not present

## 2018-09-09 DIAGNOSIS — C50511 Malignant neoplasm of lower-outer quadrant of right female breast: Secondary | ICD-10-CM | POA: Insufficient documentation

## 2018-09-09 DIAGNOSIS — Z5112 Encounter for antineoplastic immunotherapy: Secondary | ICD-10-CM | POA: Insufficient documentation

## 2018-09-09 DIAGNOSIS — Z79899 Other long term (current) drug therapy: Secondary | ICD-10-CM | POA: Diagnosis not present

## 2018-09-09 DIAGNOSIS — T451X5A Adverse effect of antineoplastic and immunosuppressive drugs, initial encounter: Secondary | ICD-10-CM | POA: Diagnosis not present

## 2018-09-09 DIAGNOSIS — Z9221 Personal history of antineoplastic chemotherapy: Secondary | ICD-10-CM | POA: Insufficient documentation

## 2018-09-09 DIAGNOSIS — G620 Drug-induced polyneuropathy: Secondary | ICD-10-CM | POA: Diagnosis not present

## 2018-09-09 DIAGNOSIS — Z95828 Presence of other vascular implants and grafts: Secondary | ICD-10-CM

## 2018-09-09 LAB — CMP (CANCER CENTER ONLY)
ALT: 21 U/L (ref 0–44)
AST: 27 U/L (ref 15–41)
Albumin: 3.9 g/dL (ref 3.5–5.0)
Alkaline Phosphatase: 74 U/L (ref 38–126)
Anion gap: 8 (ref 5–15)
BUN: 16 mg/dL (ref 6–20)
CO2: 27 mmol/L (ref 22–32)
Calcium: 9.5 mg/dL (ref 8.9–10.3)
Chloride: 103 mmol/L (ref 98–111)
Creatinine: 0.78 mg/dL (ref 0.44–1.00)
GFR, Est AFR Am: >60 mL/min (ref >60)
GFR, Estimated: >60 mL/min (ref >60)
Glucose, Bld: 96 mg/dL (ref 70–99)
Potassium: 3.8 mmol/L (ref 3.5–5.1)
Sodium: 138 mmol/L (ref 135–145)
Total Bilirubin: 0.5 mg/dL (ref 0.3–1.2)
Total Protein: 7.2 g/dL (ref 6.5–8.1)

## 2018-09-09 LAB — CBC WITH DIFFERENTIAL (CANCER CENTER ONLY)
Abs Immature Granulocytes: 0.01 10*3/uL (ref 0.00–0.07)
Basophils Absolute: 0 10*3/uL (ref 0.0–0.1)
Basophils Relative: 0 %
Eosinophils Absolute: 0.2 10*3/uL (ref 0.0–0.5)
Eosinophils Relative: 4 %
HCT: 36.8 % (ref 36.0–46.0)
Hemoglobin: 12.3 g/dL (ref 12.0–15.0)
Immature Granulocytes: 0 %
Lymphocytes Relative: 24 %
Lymphs Abs: 1.4 10*3/uL (ref 0.7–4.0)
MCH: 31.3 pg (ref 26.0–34.0)
MCHC: 33.4 g/dL (ref 30.0–36.0)
MCV: 93.6 fL (ref 80.0–100.0)
Monocytes Absolute: 0.5 10*3/uL (ref 0.1–1.0)
Monocytes Relative: 8 %
Neutro Abs: 3.7 10*3/uL (ref 1.7–7.7)
Neutrophils Relative %: 64 %
Platelet Count: 177 10*3/uL (ref 150–400)
RBC: 3.93 MIL/uL (ref 3.87–5.11)
RDW: 12.5 % (ref 11.5–15.5)
WBC Count: 5.9 10*3/uL (ref 4.0–10.5)
nRBC: 0 % (ref 0.0–0.2)

## 2018-09-09 MED ORDER — SODIUM CHLORIDE 0.9% FLUSH
10.0000 mL | INTRAVENOUS | Status: DC | PRN
  Administered 2018-09-09: 10 mL
  Filled 2018-09-09: qty 10

## 2018-09-09 MED ORDER — SODIUM CHLORIDE 0.9 % IV SOLN
Freq: Once | INTRAVENOUS | Status: AC
  Administered 2018-09-09: 12:00:00 via INTRAVENOUS
  Filled 2018-09-09: qty 250

## 2018-09-09 MED ORDER — DIPHENHYDRAMINE HCL 25 MG PO CAPS
ORAL_CAPSULE | ORAL | Status: AC
  Filled 2018-09-09: qty 2

## 2018-09-09 MED ORDER — HEPARIN SOD (PORK) LOCK FLUSH 100 UNIT/ML IV SOLN
500.0000 [IU] | Freq: Once | INTRAVENOUS | Status: AC | PRN
  Administered 2018-09-09: 500 [IU]
  Filled 2018-09-09: qty 5

## 2018-09-09 MED ORDER — ACETAMINOPHEN 325 MG PO TABS
650.0000 mg | ORAL_TABLET | Freq: Once | ORAL | Status: AC
  Administered 2018-09-09: 650 mg via ORAL

## 2018-09-09 MED ORDER — ACETAMINOPHEN 325 MG PO TABS
ORAL_TABLET | ORAL | Status: AC
  Filled 2018-09-09: qty 2

## 2018-09-09 MED ORDER — DIPHENHYDRAMINE HCL 25 MG PO CAPS
50.0000 mg | ORAL_CAPSULE | Freq: Once | ORAL | Status: AC
  Administered 2018-09-09: 50 mg via ORAL

## 2018-09-09 MED ORDER — TRASTUZUMAB-DKST CHEMO 150 MG IV SOLR
450.0000 mg | Freq: Once | INTRAVENOUS | Status: AC
  Administered 2018-09-09: 450 mg via INTRAVENOUS
  Filled 2018-09-09: qty 21.43

## 2018-09-09 NOTE — Patient Instructions (Signed)
[facility] °Discharge Instructions for Patients Receiving Chemotherapy ° °Today you received the following chemotherapy agents Trastuzumab ° °To help prevent nausea and vomiting after your treatment, we encourage you to take your nausea medication as directed. °  °If you develop nausea and vomiting that is not controlled by your nausea medication, call the clinic.  ° °BELOW ARE SYMPTOMS THAT SHOULD BE REPORTED IMMEDIATELY: °· *FEVER GREATER THAN 100.5 F °· *CHILLS WITH OR WITHOUT FEVER °· NAUSEA AND VOMITING THAT IS NOT CONTROLLED WITH YOUR NAUSEA MEDICATION °· *UNUSUAL SHORTNESS OF BREATH °· *UNUSUAL BRUISING OR BLEEDING °· TENDERNESS IN MOUTH AND THROAT WITH OR WITHOUT PRESENCE OF ULCERS °· *URINARY PROBLEMS °· *BOWEL PROBLEMS °· UNUSUAL RASH °Items with * indicate a potential emergency and should be followed up as soon as possible. ° °Feel free to call the clinic should you have any questions or concerns. The clinic phone number is [phone]. ° °Please show the CHEMO ALERT CARD at check-in to the Emergency Department and triage nurse. ° ° °

## 2018-09-09 NOTE — Assessment & Plan Note (Signed)
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 Adj Chemo with Taxol-Herceptin weekly X 11 (stopped early for neuropathy) followed by Herceptin q 3 weeks for a year. 2. Adj RT 3. Foll by Adj Anti estrogen therapy. ------------------------------------------------------------------------------------------------------------------------------------------- Current treatment:  Herceptin Labs reviewed Echocardiogram: EF 60 to 65% Chemo-induced peripheral neuropathy:  Previous problems with carpal tunnel syndrome. She plans to go to [employer] in October. She would like [facility] with Dr. Pablo Ledger for adjuvant radiation therapy.  Patient will receive Herceptin maintenance every 3 weeks I will see the patient every 6 weeks with labs.

## 2018-09-29 ENCOUNTER — Other Ambulatory Visit: Payer: Self-pay

## 2018-09-29 ENCOUNTER — Encounter: Payer: Self-pay | Admitting: *Deleted

## 2018-09-29 ENCOUNTER — Inpatient Hospital Stay: Payer: Managed Care, Other (non HMO)

## 2018-09-29 VITALS — BP 130/75 | HR 68 | Temp 98.3°F | Resp 17 | Wt 155.5 lb

## 2018-09-29 DIAGNOSIS — C50511 Malignant neoplasm of lower-outer quadrant of right female breast: Secondary | ICD-10-CM

## 2018-09-29 DIAGNOSIS — Z5112 Encounter for antineoplastic immunotherapy: Secondary | ICD-10-CM | POA: Diagnosis not present

## 2018-09-29 MED ORDER — SODIUM CHLORIDE 0.9% FLUSH
10.0000 mL | INTRAVENOUS | Status: DC | PRN
  Administered 2018-09-29: 11:00:00 10 mL
  Filled 2018-09-29: qty 10

## 2018-09-29 MED ORDER — SODIUM CHLORIDE 0.9 % IV SOLN
Freq: Once | INTRAVENOUS | Status: AC
  Administered 2018-09-29: 10:00:00 via INTRAVENOUS
  Filled 2018-09-29: qty 250

## 2018-09-29 MED ORDER — DIPHENHYDRAMINE HCL 25 MG PO CAPS
ORAL_CAPSULE | ORAL | Status: AC
  Filled 2018-09-29: qty 2

## 2018-09-29 MED ORDER — ACETAMINOPHEN 325 MG PO TABS
ORAL_TABLET | ORAL | Status: AC
  Filled 2018-09-29: qty 2

## 2018-09-29 MED ORDER — HEPARIN SOD (PORK) LOCK FLUSH 100 UNIT/ML IV SOLN
500.0000 [IU] | Freq: Once | INTRAVENOUS | Status: AC | PRN
  Administered 2018-09-29: 500 [IU]
  Filled 2018-09-29: qty 5

## 2018-09-29 MED ORDER — DIPHENHYDRAMINE HCL 25 MG PO CAPS
50.0000 mg | ORAL_CAPSULE | Freq: Once | ORAL | Status: AC
  Administered 2018-09-29: 10:00:00 50 mg via ORAL

## 2018-09-29 MED ORDER — ACETAMINOPHEN 325 MG PO TABS
650.0000 mg | ORAL_TABLET | Freq: Once | ORAL | Status: AC
  Administered 2018-09-29: 650 mg via ORAL

## 2018-09-29 MED ORDER — TRASTUZUMAB-DKST CHEMO 150 MG IV SOLR
450.0000 mg | Freq: Once | INTRAVENOUS | Status: AC
  Administered 2018-09-29: 11:00:00 450 mg via INTRAVENOUS
  Filled 2018-09-29: qty 21.43

## 2018-09-29 NOTE — Patient Instructions (Signed)
[facility] Discharge Instructions for Patients Receiving Chemotherapy  Today you received the following chemotherapy agents trastuzumab.  To help prevent nausea and vomiting after your treatment, we encourage you to take your nausea medication as directed.    If you develop nausea and vomiting that is not controlled by your nausea medication, call the clinic.   BELOW ARE SYMPTOMS THAT SHOULD BE REPORTED IMMEDIATELY:  *FEVER GREATER THAN 100.5 F  *CHILLS WITH OR WITHOUT FEVER  NAUSEA AND VOMITING THAT IS NOT CONTROLLED WITH YOUR NAUSEA MEDICATION  *UNUSUAL SHORTNESS OF BREATH  *UNUSUAL BRUISING OR BLEEDING  TENDERNESS IN MOUTH AND THROAT WITH OR WITHOUT PRESENCE OF ULCERS  *URINARY PROBLEMS  *BOWEL PROBLEMS  UNUSUAL RASH Items with * indicate a potential emergency and should be followed up as soon as possible.  Feel free to call the clinic should you have any questions or concerns. The clinic phone number is [phone].  Please show the CHEMO ALERT CARD at check-in to the Emergency Department and triage nurse.   

## 2018-10-19 NOTE — Progress Notes (Signed)
Patient Care Team: [name], MD as PCP - General (Family Medicine) Mauro Kaufmann, RN as Oncology Nurse Navigator Rockwell Germany, RN as Oncology Nurse Navigator  DIAGNOSIS:    ICD-10-CM   1. Malignant neoplasm of lower-outer quadrant of right breast of female, estrogen receptor positive ([place])  C50.511    Z17.0     SUMMARY OF ONCOLOGIC HISTORY: Oncology History  Malignant neoplasm of lower-outer quadrant of right breast of female, estrogen receptor positive ([place])  03/06/2018 Initial Diagnosis   Screening mammogram detected right breast calcifications, 8 mm mass detected by ultrasound, axilla negative, biopsy revealed grade 2 IDC with high-grade DCIS, ER 100%, PR 1%, Ki-67 30%, HER-2 +3+ by IHC, T1BN0 stage Ia   03/19/2018 Cancer Staging   Staging form: Breast, AJCC 8th Edition - Clinical stage from 03/19/2018: Stage IA (cT1b, cN0, cM0, G2, ER+, PR+, HER2+) - Signed by Nicholas Lose, MD on 03/19/2018   03/24/2018 Surgery   Right lumpectomy [name]): IDC with DCIS, grade 3, 0.6cm, ER+ (100%), PR+ (1%), HER2+ (3+), Ki67 30%, clear margins, 0.4 SLN negative for carcinoma.    05/14/2018 -  Chemotherapy   The patient had trastuzumab (HERCEPTIN) 300 mg in sodium chloride 0.9 % 250 mL chemo infusion, 273 mg, Intravenous,  Once, 4 of 4 cycles Dose modification: 6 mg/kg (original dose 2 mg/kg, Cycle 3, Reason: Other (see comments), Comment: switch to maintenance Herceptin - every 3 weeks), 6 mg/kg (original dose 6 mg/kg, Cycle 4, Reason: Other (see comments), Comment: bios not approved, herc again today as it is appr) Administration: 300 mg (05/14/2018), 150 mg (05/20/2018), 150 mg (06/10/2018), 147 mg (05/27/2018), 147 mg (06/03/2018), 150 mg (06/17/2018), 150 mg (06/24/2018), 150 mg (07/01/2018), 150 mg (07/08/2018), 150 mg (07/15/2018), 150 mg (07/22/2018), 450 mg (07/29/2018), 450 mg (08/18/2018) PACLitaxel (TAXOL) 138 mg in sodium chloride 0.9 % 250 mL chemo infusion (</= 54m/m2), 80 mg/m2 = 138 mg,  Intravenous,  Once, 3 of 3 cycles Dose modification: 65 mg/m2 (original dose 80 mg/m2, Cycle 2, Reason: Dose not tolerated), 50 mg/m2 (original dose 80 mg/m2, Cycle 3, Reason: Dose not tolerated) Administration: 138 mg (05/14/2018), 138 mg (05/20/2018), 114 mg (06/10/2018), 114 mg (05/27/2018), 114 mg (06/03/2018), 114 mg (06/17/2018), 114 mg (06/24/2018), 114 mg (07/01/2018), 114 mg (07/08/2018), 90 mg (07/15/2018), 90 mg (07/22/2018) trastuzumab-dkst (OGIVRI) 450 mg in sodium chloride 0.9 % 250 mL chemo infusion, 450 mg (100 % of original dose 450 mg), Intravenous,  Once, 3 of 13 cycles Dose modification: 450 mg (original dose 450 mg, Cycle 4, Reason: Other (see comments), Comment: Biosimilar Conversion) Administration: 450 mg (09/09/2018), 450 mg (09/29/2018)  for chemotherapy treatment.    08/2018 -  Radiation Therapy   [facility]     CHIEF COMPLIANT: Follow-up of right breast cancer on Herceptin maintenance  INTERVAL HISTORY: RDenora Montgomery a 58y.o. with above-mentioned history of right breast cancer who underwent a lumpectomy, adjuvant chemotherapy, and radiation. She is currently on Herceptin maintenance. She presents to the clinic today for treatment.  She has had no problems tolerating Herceptin.  REVIEW OF SYSTEMS:   Constitutional: Denies fevers, chills or abnormal weight loss Eyes: Denies blurriness of vision Ears, nose, mouth, throat, and face: Denies mucositis or sore throat Respiratory: Denies cough, dyspnea or wheezes Cardiovascular: Denies palpitation, chest discomfort Gastrointestinal: Denies nausea, heartburn or change in bowel habits Skin: Denies abnormal skin rashes Lymphatics: Denies new lymphadenopathy or easy bruising Neurological: Denies numbness,  tingling or new weaknesses Behavioral/Psych: Mood is stable, no new changes  Extremities: No lower extremity edema Breast: denies any pain or lumps or nodules in either breasts All other  systems were reviewed with the patient and are negative.  I have reviewed the past medical history, past surgical history, social history and family history with the patient and they are unchanged from previous note.  ALLERGIES:  has No Known Allergies.  MEDICATIONS:  Current Outpatient Medications  Medication Sig Dispense Refill  . albuterol (PROVENTIL HFA;VENTOLIN HFA) 108 (90 Base) MCG/ACT inhaler Inhale into the lungs every 6 (six) hours as needed for wheezing or shortness of breath.    [name] atenolol-chlorthalidone (TENORETIC) 100-25 MG tablet Take 1 tablet by mouth daily.    . clonazePAM (KLONOPIN) 0.5 MG tablet Take 0.5 mg by mouth at bedtime.    . lidocaine (XYLOCAINE) 2 % solution Use as directed 5 mLs in the mouth or throat every 3 (three) hours as needed for mouth pain. Swish and spit 200 mL 2  . lidocaine-prilocaine (EMLA) cream Apply to affected area once 30 g 3  . magic mouthwash SOLN Take 5 mLs by mouth 4 (four) times daily as needed for mouth pain. 240 mL 3  . Melatonin 10 MG TABS Take by mouth.    [name] omeprazole (PRILOSEC) 20 MG capsule Take 20 mg by mouth daily.    . ondansetron (ZOFRAN) 8 MG tablet Take 1 tablet (8 mg total) by mouth 2 (two) times daily as needed (Nausea or vomiting). 30 tablet 1  . Oral Wound Care Products [facility]) LIQD Use as directed 5 mLs in the mouth or throat 6 (six) times daily. 240 mL 3  . potassium chloride SA (K-DUR,KLOR-CON) 20 MEQ tablet Take 20 mEq by mouth 2 (two) times daily.    . prochlorperazine (COMPAZINE) 10 MG tablet Take 1 tablet (10 mg total) by mouth every 6 (six) hours as needed (Nausea or vomiting). 30 tablet 1   No current facility-administered medications for this visit.     PHYSICAL EXAMINATION: ECOG PERFORMANCE STATUS: 1 - Symptomatic but completely ambulatory  Vitals:   10/20/18 1025  BP: 111/71  Pulse: 61  Resp: 18  Temp: (!) 97.3 F (36.3 C)  SpO2: 99%   Filed Weights   10/20/18 1025  Weight: 158 lb 1.6 oz (71.7 kg)     GENERAL: alert, no distress and comfortable SKIN: skin color, texture, turgor are normal, no rashes or significant lesions EYES: normal, Conjunctiva are pink and non-injected, sclera clear OROPHARYNX: no exudate, no erythema and lips, buccal mucosa, and tongue normal  NECK: supple, thyroid normal size, non-tender, without nodularity LYMPH: no palpable lymphadenopathy in the cervical, axillary or inguinal LUNGS: clear to auscultation and percussion with normal breathing effort HEART: regular rate & rhythm and no murmurs and no lower extremity edema ABDOMEN: abdomen soft, non-tender and normal bowel sounds MUSCULOSKELETAL: no cyanosis of digits and no clubbing  NEURO: alert & oriented x 3 with fluent speech, no focal motor/sensory deficits EXTREMITIES: No lower extremity edema  LABORATORY DATA:  I have reviewed the data as listed CMP Latest Ref Rng & Units 09/09/2018 07/29/2018 07/22/2018  Glucose 70 - 99 mg/dL 96 92 99  BUN 6 - 20 mg/dL _0 Creatinine 0.44 - 1.00 mg/dL 0.78 0.77 0.77  Sodium 135 - 145 mmol/L 138 137 137  Potassium 3.5 - 5.1 mmol/L 3.8 3.7 3.9  Chloride 98 - 111 mmol/L 103 103 101  CO2 22 - 32  mmol/L _0 Calcium 8.9 - 10.3 mg/dL 9.5 9.2 9.4  Total Protein 6.5 - 8.1 g/dL 7.2 6.8 6.8  Total Bilirubin 0.3 - 1.2 mg/dL 0.5 0.5 0.6  Alkaline Phos 38 - 126 U/L 74 66 62  AST 15 - 41 U/L _1 ALT 0 - 44 U/L _2 Lab Results  Component Value Date   WBC 5.3 10/20/2018   HGB 13.1 10/20/2018   HCT 38.6 10/20/2018   MCV 89.8 10/20/2018   PLT 184 10/20/2018   NEUTROABS 3.6 10/20/2018    ASSESSMENT & PLAN:  Malignant neoplasm of lower-outer quadrant of right breast of female, estrogen receptor positive ([place]) 03/06/2018:Screening mammogram detected right breast calcifications, 8 mm mass detected by ultrasound, axilla negative, biopsy revealed grade 2 IDC with high-grade DCIS, ER 100%, PR 1%, Ki-67 30%, HER-2 +3+ by IHC, T1BN0 stage Ia  3.23.20: Rt  Lumpectomy Grade 2 IDC 0.6 cm, 0/4 LN Neg, Er 100%, PR 1%, Her 2: 3+ pos, T1bN0 Stage 1A  Treatment plan: 1. Adj Chemo with Taxol-Herceptin weekly X 11 (stopped early for neuropathy)followed by Herceptin q 3 weeks for a year. 2. Adj RT 3. Foll by Adj Anti estrogen therapy. ------------------------------------------------------------------------------------------------------------------------------------------- Current treatment: Herceptin maintenance which will be completed in May 2021. Labs reviewed: All her blood counts have returned back to normal. Echocardiogram: EF 60 to 65% Chemo-induced peripheral neuropathy: Mild to moderate.  It is not bothering her at night.  We will observe it for now.  Previous problems with carpal tunnel syndrome. She plans to go to [place] and other places for work on a 1500 mile road trip coming up.  Dr. Pablo Ledger for adjuvant radiation therapy.  Patient will receive Herceptin maintenance every 3 weeks I will see the patient every 6 weeks with labs.    No orders of the defined types were placed in this encounter.  The patient has a good understanding of the overall plan. she agrees with it. she will call with any problems that may develop before the next visit here.  Nicholas Lose, MD 10/20/2018  Julious Oka Dorshimer am acting as scribe for Dr. Nicholas Lose.  I have reviewed the above documentation for accuracy and completeness, and I agree with the above.

## 2018-10-20 ENCOUNTER — Inpatient Hospital Stay: Payer: Managed Care, Other (non HMO) | Attending: Hematology and Oncology

## 2018-10-20 ENCOUNTER — Inpatient Hospital Stay: Payer: Managed Care, Other (non HMO)

## 2018-10-20 ENCOUNTER — Other Ambulatory Visit: Payer: Self-pay

## 2018-10-20 ENCOUNTER — Inpatient Hospital Stay (HOSPITAL_BASED_OUTPATIENT_CLINIC_OR_DEPARTMENT_OTHER): Payer: Managed Care, Other (non HMO) | Admitting: Hematology and Oncology

## 2018-10-20 DIAGNOSIS — Z923 Personal history of irradiation: Secondary | ICD-10-CM | POA: Insufficient documentation

## 2018-10-20 DIAGNOSIS — Z5112 Encounter for antineoplastic immunotherapy: Secondary | ICD-10-CM | POA: Diagnosis not present

## 2018-10-20 DIAGNOSIS — Z79899 Other long term (current) drug therapy: Secondary | ICD-10-CM | POA: Diagnosis not present

## 2018-10-20 DIAGNOSIS — Z9221 Personal history of antineoplastic chemotherapy: Secondary | ICD-10-CM | POA: Diagnosis not present

## 2018-10-20 DIAGNOSIS — C50511 Malignant neoplasm of lower-outer quadrant of right female breast: Secondary | ICD-10-CM | POA: Diagnosis not present

## 2018-10-20 DIAGNOSIS — K219 Gastro-esophageal reflux disease without esophagitis: Secondary | ICD-10-CM | POA: Diagnosis not present

## 2018-10-20 DIAGNOSIS — Z95828 Presence of other vascular implants and grafts: Secondary | ICD-10-CM

## 2018-10-20 DIAGNOSIS — Z170 Estrogen receptor positive status [ER+]: Secondary | ICD-10-CM | POA: Insufficient documentation

## 2018-10-20 DIAGNOSIS — G620 Drug-induced polyneuropathy: Secondary | ICD-10-CM | POA: Diagnosis not present

## 2018-10-20 LAB — CMP (CANCER CENTER ONLY)
ALT: 31 U/L (ref 0–44)
AST: 33 U/L (ref 15–41)
Albumin: 3.8 g/dL (ref 3.5–5.0)
Alkaline Phosphatase: 83 U/L (ref 38–126)
Anion gap: 10 (ref 5–15)
BUN: 17 mg/dL (ref 6–20)
CO2: 28 mmol/L (ref 22–32)
Calcium: 9.7 mg/dL (ref 8.9–10.3)
Chloride: 102 mmol/L (ref 98–111)
Creatinine: 0.79 mg/dL (ref 0.44–1.00)
GFR, Est AFR Am: >60 mL/min (ref >60)
GFR, Estimated: >60 mL/min (ref >60)
Glucose, Bld: 100 mg/dL — ABNORMAL HIGH (ref 70–99)
Potassium: 3.9 mmol/L (ref 3.5–5.1)
Sodium: 140 mmol/L (ref 135–145)
Total Bilirubin: 0.5 mg/dL (ref 0.3–1.2)
Total Protein: 7.2 g/dL (ref 6.5–8.1)

## 2018-10-20 LAB — CBC WITH DIFFERENTIAL (CANCER CENTER ONLY)
Abs Immature Granulocytes: 0.02 10*3/uL (ref 0.00–0.07)
Basophils Absolute: 0 10*3/uL (ref 0.0–0.1)
Basophils Relative: 0 %
Eosinophils Absolute: 0.2 10*3/uL (ref 0.0–0.5)
Eosinophils Relative: 4 %
HCT: 38.6 % (ref 36.0–46.0)
Hemoglobin: 13.1 g/dL (ref 12.0–15.0)
Immature Granulocytes: 0 %
Lymphocytes Relative: 18 %
Lymphs Abs: 1 10*3/uL (ref 0.7–4.0)
MCH: 30.5 pg (ref 26.0–34.0)
MCHC: 33.9 g/dL (ref 30.0–36.0)
MCV: 89.8 fL (ref 80.0–100.0)
Monocytes Absolute: 0.5 10*3/uL (ref 0.1–1.0)
Monocytes Relative: 9 %
Neutro Abs: 3.6 10*3/uL (ref 1.7–7.7)
Neutrophils Relative %: 69 %
Platelet Count: 184 10*3/uL (ref 150–400)
RBC: 4.3 MIL/uL (ref 3.87–5.11)
RDW: 12.3 % (ref 11.5–15.5)
WBC Count: 5.3 10*3/uL (ref 4.0–10.5)
nRBC: 0 % (ref 0.0–0.2)

## 2018-10-20 MED ORDER — SODIUM CHLORIDE 0.9 % IV SOLN
Freq: Once | INTRAVENOUS | Status: AC
  Administered 2018-10-20: 11:00:00 via INTRAVENOUS
  Filled 2018-10-20: qty 250

## 2018-10-20 MED ORDER — SODIUM CHLORIDE 0.9% FLUSH
10.0000 mL | INTRAVENOUS | Status: DC | PRN
  Administered 2018-10-20: 10 mL
  Filled 2018-10-20: qty 10

## 2018-10-20 MED ORDER — DIPHENHYDRAMINE HCL 25 MG PO CAPS
ORAL_CAPSULE | ORAL | Status: AC
  Filled 2018-10-20: qty 2

## 2018-10-20 MED ORDER — ACETAMINOPHEN 325 MG PO TABS
650.0000 mg | ORAL_TABLET | Freq: Once | ORAL | Status: AC
  Administered 2018-10-20: 11:00:00 650 mg via ORAL

## 2018-10-20 MED ORDER — ACETAMINOPHEN 325 MG PO TABS
ORAL_TABLET | ORAL | Status: AC
  Filled 2018-10-20: qty 2

## 2018-10-20 MED ORDER — DIPHENHYDRAMINE HCL 25 MG PO CAPS
50.0000 mg | ORAL_CAPSULE | Freq: Once | ORAL | Status: AC
  Administered 2018-10-20: 11:00:00 50 mg via ORAL

## 2018-10-20 MED ORDER — TRASTUZUMAB-DKST CHEMO 150 MG IV SOLR
450.0000 mg | Freq: Once | INTRAVENOUS | Status: AC
  Administered 2018-10-20: 450 mg via INTRAVENOUS
  Filled 2018-10-20: qty 21.43

## 2018-10-20 MED ORDER — HEPARIN SOD (PORK) LOCK FLUSH 100 UNIT/ML IV SOLN
500.0000 [IU] | Freq: Once | INTRAVENOUS | Status: AC | PRN
  Administered 2018-10-20: 12:00:00 500 [IU]
  Filled 2018-10-20: qty 5

## 2018-10-20 NOTE — Patient Instructions (Signed)

## 2018-10-20 NOTE — Assessment & Plan Note (Signed)
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 Adj Chemo with Taxol-Herceptin weekly X 11 (stopped early for neuropathy)followed by Herceptin q 3 weeks for a year. 2. Adj RT 3. Foll by Adj Anti estrogen therapy. ------------------------------------------------------------------------------------------------------------------------------------------- Current treatment: Herceptin maintenance which will be completed in May 2021. Labs reviewed: All her blood counts have returned back to normal. Echocardiogram: EF 60 to 65% Chemo-induced peripheral neuropathy: Mild to moderate.  It is not bothering her at night.  We will observe it for now.  Previous problems with carpal tunnel syndrome. She plans to go to [employer] in October. Currently receiving radiation at [place] for adjuvant radiation therapy.  Patient will receive Herceptin maintenance every 3 weeks I will see the patient every 6 weeks with labs.

## 2018-10-20 NOTE — Patient Instructions (Signed)
[place] Discharge Instructions for Patients Receiving Immunotherapy  Today you received the following agents: Trastuzumab   BELOW ARE SYMPTOMS THAT SHOULD BE REPORTED IMMEDIATELY:  *FEVER GREATER THAN 100.5 F  *CHILLS WITH OR WITHOUT FEVER  NAUSEA AND VOMITING THAT IS NOT CONTROLLED WITH YOUR NAUSEA MEDICATION  *UNUSUAL SHORTNESS OF BREATH  *UNUSUAL BRUISING OR BLEEDING  TENDERNESS IN MOUTH AND THROAT WITH OR WITHOUT PRESENCE OF ULCERS  *URINARY PROBLEMS  *BOWEL PROBLEMS  UNUSUAL RASH Items with * indicate a potential emergency and should be followed up as soon as possible.  Feel free to call the clinic should you have any questions or concerns. The clinic phone number is [phone].  Please show the [place] at check-in to the Emergency Department and triage nurse.

## 2018-10-21 ENCOUNTER — Encounter: Payer: Self-pay | Admitting: *Deleted

## 2018-10-21 ENCOUNTER — Ambulatory Visit: Payer: Managed Care, Other (non HMO) | Admitting: Hematology and Oncology

## 2018-10-21 ENCOUNTER — Ambulatory Visit: Payer: Managed Care, Other (non HMO)

## 2018-10-21 ENCOUNTER — Other Ambulatory Visit: Payer: Managed Care, Other (non HMO)

## 2018-10-22 ENCOUNTER — Telehealth: Payer: Self-pay | Admitting: Hematology and Oncology

## 2018-10-22 NOTE — Telephone Encounter (Signed)
Added additional appointments for January and February. Conformed with patient. Other appointments remain the same. Patient will get updated schedule 11/9.

## 2018-11-04 ENCOUNTER — Telehealth: Payer: Self-pay | Admitting: *Deleted

## 2018-11-04 NOTE — Telephone Encounter (Signed)
Received call from pt asking when she could get her shingles shot, can she color her hair now, & when does she start tamoxifen? Informed that a message would be sent to Dr Rolla Plate RN but probably could color hair, will defer to Dr Lindi Adie about shingles shot & think tamoxifen will start after she finishes immunotherapy.  Message to RN to clarify with Dr Lindi Adie & call pt if different answers.

## 2018-11-05 ENCOUNTER — Telehealth: Payer: Self-pay

## 2018-11-05 MED ORDER — TAMOXIFEN CITRATE 20 MG PO TABS: 20.0000 mg | ORAL_TABLET | Freq: Every day | ORAL | 2 refills | Status: DC

## 2018-11-05 NOTE — Telephone Encounter (Signed)
Per MD recommendations okay for patient to start Tamoxifen 20mg  tablet daily.   RN notified patient, educated on administration and side effects.  Pt voiced understanding and agreement.  Rx sent to pharmacy.

## 2018-11-10 ENCOUNTER — Other Ambulatory Visit: Payer: Self-pay

## 2018-11-10 ENCOUNTER — Inpatient Hospital Stay: Payer: Managed Care, Other (non HMO) | Attending: Hematology and Oncology

## 2018-11-10 VITALS — BP 122/79 | HR 67 | Temp 98.0°F | Resp 18 | Wt 156.2 lb

## 2018-11-10 DIAGNOSIS — C50511 Malignant neoplasm of lower-outer quadrant of right female breast: Secondary | ICD-10-CM

## 2018-11-10 DIAGNOSIS — Z5112 Encounter for antineoplastic immunotherapy: Secondary | ICD-10-CM | POA: Diagnosis present

## 2018-11-10 DIAGNOSIS — Z170 Estrogen receptor positive status [ER+]: Secondary | ICD-10-CM | POA: Diagnosis not present

## 2018-11-10 DIAGNOSIS — Z79899 Other long term (current) drug therapy: Secondary | ICD-10-CM | POA: Diagnosis not present

## 2018-11-10 DIAGNOSIS — Z79811 Long term (current) use of aromatase inhibitors: Secondary | ICD-10-CM | POA: Insufficient documentation

## 2018-11-10 DIAGNOSIS — K219 Gastro-esophageal reflux disease without esophagitis: Secondary | ICD-10-CM | POA: Insufficient documentation

## 2018-11-10 DIAGNOSIS — Z923 Personal history of irradiation: Secondary | ICD-10-CM | POA: Insufficient documentation

## 2018-11-10 DIAGNOSIS — Z9221 Personal history of antineoplastic chemotherapy: Secondary | ICD-10-CM | POA: Diagnosis not present

## 2018-11-10 MED ORDER — TRASTUZUMAB-DKST CHEMO 150 MG IV SOLR
450.0000 mg | Freq: Once | INTRAVENOUS | Status: AC
  Administered 2018-11-10: 450 mg via INTRAVENOUS
  Filled 2018-11-10: qty 21.43

## 2018-11-10 MED ORDER — SODIUM CHLORIDE 0.9% FLUSH
10.0000 mL | INTRAVENOUS | Status: DC | PRN
  Administered 2018-11-10: 10 mL
  Filled 2018-11-10: qty 10

## 2018-11-10 MED ORDER — ACETAMINOPHEN 325 MG PO TABS
ORAL_TABLET | ORAL | Status: AC
  Filled 2018-11-10: qty 2

## 2018-11-10 MED ORDER — DIPHENHYDRAMINE HCL 25 MG PO CAPS
ORAL_CAPSULE | ORAL | Status: AC
  Filled 2018-11-10: qty 2

## 2018-11-10 MED ORDER — ACETAMINOPHEN 325 MG PO TABS
650.0000 mg | ORAL_TABLET | Freq: Once | ORAL | Status: AC
  Administered 2018-11-10: 650 mg via ORAL

## 2018-11-10 MED ORDER — SODIUM CHLORIDE 0.9 % IV SOLN
Freq: Once | INTRAVENOUS | Status: AC
  Administered 2018-11-10: 09:00:00 via INTRAVENOUS
  Filled 2018-11-10: qty 250

## 2018-11-10 MED ORDER — DIPHENHYDRAMINE HCL 25 MG PO CAPS
50.0000 mg | ORAL_CAPSULE | Freq: Once | ORAL | Status: AC
  Administered 2018-11-10: 50 mg via ORAL

## 2018-11-10 MED ORDER — HEPARIN SOD (PORK) LOCK FLUSH 100 UNIT/ML IV SOLN
500.0000 [IU] | Freq: Once | INTRAVENOUS | Status: AC | PRN
  Administered 2018-11-10: 500 [IU]
  Filled 2018-11-10: qty 5

## 2018-11-10 NOTE — Patient Instructions (Signed)
[facility] Discharge Instructions for Patients Receiving Chemotherapy  Today you received the following chemotherapy agents Trastuzumab (OGIVRI).  To help prevent nausea and vomiting after your treatment, we encourage you to take your nausea medication as prescribed.   If you develop nausea and vomiting that is not controlled by your nausea medication, call the clinic.   BELOW ARE SYMPTOMS THAT SHOULD BE REPORTED IMMEDIATELY:  *FEVER GREATER THAN 100.5 F  *CHILLS WITH OR WITHOUT FEVER  NAUSEA AND VOMITING THAT IS NOT CONTROLLED WITH YOUR NAUSEA MEDICATION  *UNUSUAL SHORTNESS OF BREATH  *UNUSUAL BRUISING OR BLEEDING  TENDERNESS IN MOUTH AND THROAT WITH OR WITHOUT PRESENCE OF ULCERS  *URINARY PROBLEMS  *BOWEL PROBLEMS  UNUSUAL RASH Items with * indicate a potential emergency and should be followed up as soon as possible.  Feel free to call the clinic should you have any questions or concerns. The clinic phone number is [phone].  Please show the CHEMO ALERT CARD at check-in to the Emergency Department and triage nurse.  Coronavirus (COVID-19) Are you at risk?  Are you at risk for the Coronavirus (COVID-19)?  To be considered HIGH RISK for Coronavirus (COVID-19), you have to meet the following criteria:  . Traveled to [place], [place], [place], [place] or [place]; or in the [place] to [place], [place], [place], or [place]; and have fever, cough, and shortness of breath within the last 2 weeks of travel OR . Been in close contact with a person diagnosed with COVID-19 within the last 2 weeks and have fever, cough, and shortness of breath . IF YOU DO NOT MEET THESE CRITERIA, YOU ARE CONSIDERED LOW RISK FOR COVID-19.  What to do if you are HIGH RISK for COVID-19?  . If you are having a medical emergency, call 911. . Seek medical care right away. Before you go to a doctor's office, urgent care or emergency department, call ahead and tell them  about your recent travel, contact with someone diagnosed with COVID-19, and your symptoms. You should receive instructions from your physician's office regarding next steps of care.  . When you arrive at healthcare provider, tell the healthcare staff immediately you have returned from visiting [place], [place], [place], [place] or [place]; or traveled in the [place] to [place], [place], [place], or [place]; in the last two weeks or you have been in close contact with a person diagnosed with COVID-19 in the last 2 weeks.   . Tell the health care staff about your symptoms: fever, cough and shortness of breath. . After you have been seen by a medical provider, you will be either: o Tested for (COVID-19) and discharged home on quarantine except to seek medical care if symptoms worsen, and asked to  - Stay home and avoid contact with others until you get your results (4-5 days)  - Avoid travel on public transportation if possible (such as bus, train, or airplane) or o Sent to the Emergency Department by EMS for evaluation, COVID-19 testing, and possible admission depending on your condition and test results.  What to do if you are LOW RISK for COVID-19?  Reduce your risk of any infection by using the same precautions used for avoiding the common cold or flu:  . Wash your hands often with soap and warm water for at least 20 seconds.  If soap and water are not readily available, use an alcohol-based hand sanitizer with at least 60% alcohol.  . If coughing or   sneezing, cover your mouth and nose by coughing or sneezing into the elbow areas of your shirt or coat, into a tissue or into your sleeve (not your hands). . Avoid shaking hands with others and consider head nods or verbal greetings only. . Avoid touching your eyes, nose, or mouth with unwashed hands.  . Avoid close contact with people who are sick. . Avoid places or events with large numbers of people in one location, like concerts or  sporting events. . Carefully consider travel plans you have or are making. . If you are planning any travel outside or inside the [place], visit the CDC's Travelers' Health webpage for the latest health notices. . If you have some symptoms but not all symptoms, continue to monitor at home and seek medical attention if your symptoms worsen. . If you are having a medical emergency, call 911.   [place] / e-Visit: [email or web address]         [facility]: [facility]: [phone]                   [facility]: [phone]

## 2018-11-11 ENCOUNTER — Ambulatory Visit: Payer: Managed Care, Other (non HMO)

## 2018-11-17 ENCOUNTER — Other Ambulatory Visit: Payer: Self-pay

## 2018-11-17 ENCOUNTER — Ambulatory Visit (HOSPITAL_COMMUNITY)
Admission: RE | Admit: 2018-11-17 | Discharge: 2018-11-17 | Disposition: A | Discharge status: Home / Self Care | Payer: Managed Care, Other (non HMO) | Source: Ambulatory Visit | Attending: Hematology and Oncology | Admitting: Hematology and Oncology

## 2018-11-17 DIAGNOSIS — C50511 Malignant neoplasm of lower-outer quadrant of right female breast: Secondary | ICD-10-CM | POA: Diagnosis present

## 2018-11-17 DIAGNOSIS — I340 Nonrheumatic mitral (valve) insufficiency: Secondary | ICD-10-CM | POA: Diagnosis not present

## 2018-11-17 DIAGNOSIS — Z170 Estrogen receptor positive status [ER+]: Secondary | ICD-10-CM | POA: Diagnosis not present

## 2018-11-17 NOTE — Progress Notes (Signed)
  Echocardiogram 2D Echocardiogram has been performed.  Burnett Kanaris 11/17/2018, 9:58 AM

## 2018-11-30 NOTE — Progress Notes (Signed)
Patient Care Team: [name], MD as PCP - General (Family Medicine) Mauro Kaufmann, RN as Oncology Nurse Navigator Rockwell Germany, RN as Oncology Nurse Navigator  DIAGNOSIS:    ICD-10-CM   1. Malignant neoplasm of lower-outer quadrant of right breast of female, estrogen receptor positive ([place])  C50.511    Z17.0     SUMMARY OF ONCOLOGIC HISTORY: Oncology History  Malignant neoplasm of lower-outer quadrant of right breast of female, estrogen receptor positive ([place])  03/06/2018 Initial Diagnosis   Screening mammogram detected right breast calcifications, 8 mm mass detected by ultrasound, axilla negative, biopsy revealed grade 2 IDC with high-grade DCIS, ER 100%, PR 1%, Ki-67 30%, HER-2 +3+ by IHC, T1BN0 stage Ia   03/19/2018 Cancer Staging   Staging form: Breast, AJCC 8th Edition - Clinical stage from 03/19/2018: Stage IA (cT1b, cN0, cM0, G2, ER+, PR+, HER2+) - Signed by Nicholas Lose, MD on 03/19/2018   03/24/2018 Surgery   Right lumpectomy [name]): IDC with DCIS, grade 3, 0.6cm, ER+ (100%), PR+ (1%), HER2+ (3+), Ki67 30%, clear margins, 0.4 SLN negative for carcinoma.    05/14/2018 -  Chemotherapy   The patient had trastuzumab (HERCEPTIN) 300 mg in sodium chloride 0.9 % 250 mL chemo infusion, 273 mg, Intravenous,  Once, 4 of 4 cycles Dose modification: 6 mg/kg (original dose 2 mg/kg, Cycle 3, Reason: Other (see comments), Comment: switch to maintenance Herceptin - every 3 weeks), 6 mg/kg (original dose 6 mg/kg, Cycle 4, Reason: Other (see comments), Comment: bios not approved, herc again today as it is appr) Administration: 300 mg (05/14/2018), 150 mg (05/20/2018), 150 mg (06/10/2018), 147 mg (05/27/2018), 147 mg (06/03/2018), 150 mg (06/17/2018), 150 mg (06/24/2018), 150 mg (07/01/2018), 150 mg (07/08/2018), 150 mg (07/15/2018), 150 mg (07/22/2018), 450 mg (07/29/2018), 450 mg (08/18/2018) PACLitaxel (TAXOL) 138 mg in sodium chloride 0.9 % 250 mL chemo infusion (</= 76m/m2), 80 mg/m2 = 138 mg,  Intravenous,  Once, 3 of 3 cycles Dose modification: 65 mg/m2 (original dose 80 mg/m2, Cycle 2, Reason: Dose not tolerated), 50 mg/m2 (original dose 80 mg/m2, Cycle 3, Reason: Dose not tolerated) Administration: 138 mg (05/14/2018), 138 mg (05/20/2018), 114 mg (06/10/2018), 114 mg (05/27/2018), 114 mg (06/03/2018), 114 mg (06/17/2018), 114 mg (06/24/2018), 114 mg (07/01/2018), 114 mg (07/08/2018), 90 mg (07/15/2018), 90 mg (07/22/2018) trastuzumab-dkst (OGIVRI) 450 mg in sodium chloride 0.9 % 250 mL chemo infusion, 450 mg (100 % of original dose 450 mg), Intravenous,  Once, 5 of 13 cycles Dose modification: 450 mg (original dose 450 mg, Cycle 4, Reason: Other (see comments), Comment: Biosimilar Conversion) Administration: 450 mg (09/09/2018), 450 mg (09/29/2018), 450 mg (10/20/2018), 450 mg (11/10/2018)  for chemotherapy treatment.    08/2018 -  Radiation Therapy   [facility]     CHIEF COMPLIANT: Follow-up of right breast cancer on Herceptin maintenance  INTERVAL HISTORY: Kim Brionesis a 58y.o. with above-mentioned history of right breast cancer who underwent a lumpectomy, adjuvant chemotherapy, and radiation. She is currently on Herceptin maintenance.Echo on 11/17/18 showed an ejection fraction of 60-65%. She presents to the clinic todayfor treatment.  REVIEW OF SYSTEMS:   Constitutional: Denies fevers, chills or abnormal weight loss Eyes: Denies blurriness of vision Ears, nose, mouth, throat, and face: Denies mucositis or sore throat Respiratory: Denies cough, dyspnea or wheezes Cardiovascular: Denies palpitation, chest discomfort Gastrointestinal: Denies nausea, heartburn or change in bowel habits Skin: Denies abnormal skin rashes Lymphatics: Denies new lymphadenopathy or  easy bruising Neurological: Denies numbness, tingling or new weaknesses Behavioral/Psych: Mood is stable, no new changes  Extremities: No lower extremity edema Breast: denies any pain  or lumps or nodules in either breasts All other systems were reviewed with the patient and are negative.  I have reviewed the past medical history, past surgical history, social history and family history with the patient and they are unchanged from previous note.  ALLERGIES:  has No Known Allergies.  MEDICATIONS:  Current Outpatient Medications  Medication Sig Dispense Refill  . albuterol (PROVENTIL HFA;VENTOLIN HFA) 108 (90 Base) MCG/ACT inhaler Inhale into the lungs every 6 (six) hours as needed for wheezing or shortness of breath.    [name] atenolol-chlorthalidone (TENORETIC) 100-25 MG tablet Take 1 tablet by mouth daily.    . clonazePAM (KLONOPIN) 0.5 MG tablet Take 0.5 mg by mouth at bedtime.    . lidocaine (XYLOCAINE) 2 % solution Use as directed 5 mLs in the mouth or throat every 3 (three) hours as needed for mouth pain. Swish and spit 200 mL 2  . lidocaine-prilocaine (EMLA) cream Apply to affected area once 30 g 3  . magic mouthwash SOLN Take 5 mLs by mouth 4 (four) times daily as needed for mouth pain. 240 mL 3  . Melatonin 10 MG TABS Take by mouth.    [name] omeprazole (PRILOSEC) 20 MG capsule Take 20 mg by mouth daily.    . ondansetron (ZOFRAN) 8 MG tablet Take 1 tablet (8 mg total) by mouth 2 (two) times daily as needed (Nausea or vomiting). 30 tablet 1  . Oral Wound Care Products [facility]) LIQD Use as directed 5 mLs in the mouth or throat 6 (six) times daily. 240 mL 3  . potassium chloride SA (K-DUR,KLOR-CON) 20 MEQ tablet Take 20 mEq by mouth 2 (two) times daily.    . prochlorperazine (COMPAZINE) 10 MG tablet Take 1 tablet (10 mg total) by mouth every 6 (six) hours as needed (Nausea or vomiting). 30 tablet 1  . tamoxifen (NOLVADEX) 20 MG tablet Take 1 tablet (20 mg total) by mouth daily. 30 tablet 2   No current facility-administered medications for this visit.     PHYSICAL EXAMINATION: ECOG PERFORMANCE STATUS: 1 - Symptomatic but completely ambulatory  Vitals:   12/01/18 0918   BP: 121/75  Pulse: 73  Resp: 18  Temp: 98.8 F (37.1 C)  SpO2: 98%   Filed Weights   12/01/18 0918  Weight: 157 lb 9.6 oz (71.5 kg)    GENERAL: alert, no distress and comfortable SKIN: skin color, texture, turgor are normal, no rashes or significant lesions EYES: normal, Conjunctiva are pink and non-injected, sclera clear OROPHARYNX: no exudate, no erythema and lips, buccal mucosa, and tongue normal  NECK: supple, thyroid normal size, non-tender, without nodularity LYMPH: no palpable lymphadenopathy in the cervical, axillary or inguinal LUNGS: clear to auscultation and percussion with normal breathing effort HEART: regular rate & rhythm and no murmurs and no lower extremity edema ABDOMEN: abdomen soft, non-tender and normal bowel sounds MUSCULOSKELETAL: no cyanosis of digits and no clubbing  NEURO: alert & oriented x 3 with fluent speech, no focal motor/sensory deficits EXTREMITIES: No lower extremity edema  LABORATORY DATA:  I have reviewed the data as listed CMP Latest Ref Rng & Units 12/01/2018 10/20/2018 09/09/2018  Glucose 70 - 99 mg/dL 98 100(H) 96  BUN 6 - 20 mg/dL _0 Creatinine 0.44 - 1.00 mg/dL 0.80 0.79 0.78  Sodium 135 - 145 mmol/L 140 140 138  Potassium 3.5 - 5.1 mmol/L 3.7 3.9 3.8  Chloride 98 - 111 mmol/L 103 102 103  CO2 22 - 32 mmol/L _0 Calcium 8.9 - 10.3 mg/dL 9.1 9.7 9.5  Total Protein 6.5 - 8.1 g/dL 7.0 7.2 7.2  Total Bilirubin 0.3 - 1.2 mg/dL 0.7 0.5 0.5  Alkaline Phos 38 - 126 U/L 79 83 74  AST 15 - 41 U/L 35 33 27  ALT 0 - 44 U/L _1 Lab Results  Component Value Date   WBC 5.4 12/01/2018   HGB 12.3 12/01/2018   HCT 36.9 12/01/2018   MCV 88.7 12/01/2018   PLT 154 12/01/2018   NEUTROABS 3.4 12/01/2018    ASSESSMENT & PLAN:  Malignant neoplasm of lower-outer quadrant of right breast of female, estrogen receptor positive ([place]) 03/06/2018:Screening mammogram detected right breast calcifications, 8 mm mass detected by  ultrasound, axilla negative, 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 Adj Chemo with Taxol-Herceptin weekly X 11 (stopped early for neuropathy)followed by Herceptin q 3 weeks for a year. 2. Adj RT completed September 2020 3. Foll by Adj Anti estrogen therapy with tamoxifen started September 2020. ------------------------------------------------------------------------------------------------------------------------------------------- Current treatment: Herceptinmaintenance which will be completed in May 2021. Labs reviewed: All her blood counts have returned back to normal. 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 It is not bothering her at night. We will observe it for now.  Previous problems with carpal tunnel syndrome. She plans to go to [place] and other places for work on a 1500 mile road trip coming up.  Dr. Pablo Ledger for adjuvant radiation therapy.  Tamoxifen toxicities: None Carpal tunnel syndrome causing discomfort  Patient will receive Herceptin maintenance every 3 weeks I will see the patient every 6 weeks with labs.    No orders of the defined types were placed in this encounter.  The patient has a good understanding of the overall plan. she agrees with it. she will call with any problems that may develop before the next visit here.  Nicholas Lose, MD 12/01/2018  Julious Oka Dorshimer, am acting as scribe for Dr. Nicholas Lose.  I have reviewed the above documentation for accuracy and completeness, and I agree with the above.

## 2018-12-01 ENCOUNTER — Inpatient Hospital Stay: Payer: Managed Care, Other (non HMO)

## 2018-12-01 ENCOUNTER — Other Ambulatory Visit: Payer: Self-pay

## 2018-12-01 ENCOUNTER — Inpatient Hospital Stay (HOSPITAL_BASED_OUTPATIENT_CLINIC_OR_DEPARTMENT_OTHER): Payer: Managed Care, Other (non HMO) | Admitting: Hematology and Oncology

## 2018-12-01 ENCOUNTER — Encounter: Payer: Self-pay | Admitting: *Deleted

## 2018-12-01 DIAGNOSIS — Z170 Estrogen receptor positive status [ER+]: Secondary | ICD-10-CM

## 2018-12-01 DIAGNOSIS — C50511 Malignant neoplasm of lower-outer quadrant of right female breast: Secondary | ICD-10-CM

## 2018-12-01 DIAGNOSIS — Z95828 Presence of other vascular implants and grafts: Secondary | ICD-10-CM

## 2018-12-01 DIAGNOSIS — Z5112 Encounter for antineoplastic immunotherapy: Secondary | ICD-10-CM | POA: Diagnosis not present

## 2018-12-01 LAB — CBC WITH DIFFERENTIAL (CANCER CENTER ONLY)
Abs Immature Granulocytes: 0.01 10*3/uL (ref 0.00–0.07)
Basophils Absolute: 0 10*3/uL (ref 0.0–0.1)
Basophils Relative: 0 %
Eosinophils Absolute: 0.2 10*3/uL (ref 0.0–0.5)
Eosinophils Relative: 4 %
HCT: 36.9 % (ref 36.0–46.0)
Hemoglobin: 12.3 g/dL (ref 12.0–15.0)
Immature Granulocytes: 0 %
Lymphocytes Relative: 23 %
Lymphs Abs: 1.3 10*3/uL (ref 0.7–4.0)
MCH: 29.6 pg (ref 26.0–34.0)
MCHC: 33.3 g/dL (ref 30.0–36.0)
MCV: 88.7 fL (ref 80.0–100.0)
Monocytes Absolute: 0.5 10*3/uL (ref 0.1–1.0)
Monocytes Relative: 9 %
Neutro Abs: 3.4 10*3/uL (ref 1.7–7.7)
Neutrophils Relative %: 64 %
Platelet Count: 154 10*3/uL (ref 150–400)
RBC: 4.16 MIL/uL (ref 3.87–5.11)
RDW: 13.6 % (ref 11.5–15.5)
WBC Count: 5.4 10*3/uL (ref 4.0–10.5)
nRBC: 0 % (ref 0.0–0.2)

## 2018-12-01 LAB — CMP (CANCER CENTER ONLY)
ALT: 28 U/L (ref 0–44)
AST: 35 U/L (ref 15–41)
Albumin: 3.8 g/dL (ref 3.5–5.0)
Alkaline Phosphatase: 79 U/L (ref 38–126)
Anion gap: 11 (ref 5–15)
BUN: 16 mg/dL (ref 6–20)
CO2: 26 mmol/L (ref 22–32)
Calcium: 9.1 mg/dL (ref 8.9–10.3)
Chloride: 103 mmol/L (ref 98–111)
Creatinine: 0.8 mg/dL (ref 0.44–1.00)
GFR, Est AFR Am: >60 mL/min (ref >60)
GFR, Estimated: >60 mL/min (ref >60)
Glucose, Bld: 98 mg/dL (ref 70–99)
Potassium: 3.7 mmol/L (ref 3.5–5.1)
Sodium: 140 mmol/L (ref 135–145)
Total Bilirubin: 0.7 mg/dL (ref 0.3–1.2)
Total Protein: 7 g/dL (ref 6.5–8.1)

## 2018-12-01 MED ORDER — ACETAMINOPHEN 325 MG PO TABS
ORAL_TABLET | ORAL | Status: AC
  Filled 2018-12-01: qty 2

## 2018-12-01 MED ORDER — DIPHENHYDRAMINE HCL 25 MG PO CAPS
ORAL_CAPSULE | ORAL | Status: AC
  Filled 2018-12-01: qty 2

## 2018-12-01 MED ORDER — SODIUM CHLORIDE 0.9 % IV SOLN
Freq: Once | INTRAVENOUS | Status: AC
  Administered 2018-12-01: 10:00:00 via INTRAVENOUS
  Filled 2018-12-01: qty 250

## 2018-12-01 MED ORDER — SODIUM CHLORIDE 0.9% FLUSH
10.0000 mL | INTRAVENOUS | Status: DC | PRN
  Administered 2018-12-01: 10 mL
  Filled 2018-12-01: qty 10

## 2018-12-01 MED ORDER — ACETAMINOPHEN 325 MG PO TABS
650.0000 mg | ORAL_TABLET | Freq: Once | ORAL | Status: AC
  Administered 2018-12-01: 650 mg via ORAL

## 2018-12-01 MED ORDER — HEPARIN SOD (PORK) LOCK FLUSH 100 UNIT/ML IV SOLN
500.0000 [IU] | Freq: Once | INTRAVENOUS | Status: AC | PRN
  Administered 2018-12-01: 500 [IU]
  Filled 2018-12-01: qty 5

## 2018-12-01 MED ORDER — TRASTUZUMAB-DKST CHEMO 150 MG IV SOLR
450.0000 mg | Freq: Once | INTRAVENOUS | Status: AC
  Administered 2018-12-01: 450 mg via INTRAVENOUS
  Filled 2018-12-01: qty 21.43

## 2018-12-01 MED ORDER — DIPHENHYDRAMINE HCL 25 MG PO CAPS
50.0000 mg | ORAL_CAPSULE | Freq: Once | ORAL | Status: AC
  Administered 2018-12-01: 50 mg via ORAL

## 2018-12-01 NOTE — Assessment & Plan Note (Signed)
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 Adj Chemo with Taxol-Herceptin weekly X 11 (stopped early for neuropathy)followed by Herceptin q 3 weeks for a year. 2. Adj RT 3. Foll by Adj Anti estrogen therapy. ------------------------------------------------------------------------------------------------------------------------------------------- Current treatment: Herceptinmaintenance which will be completed in May 2021. Labs reviewed: All her blood counts have returned back to normal. 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 It is not bothering her at night. We will observe it for now.  Previous problems with carpal tunnel syndrome. She plans to go to [place] and other places for work on a 1500 mile road trip coming up.  Dr. Pablo Ledger for adjuvant radiation therapy.  Since she completed radiation, recommended starting her on adjuvant antiestrogen therapy with anastrozole 1 mg daily. Anastrozole counseling:We discussed the risks and benefits of anti-estrogen therapy with aromatase inhibitors. These include but not limited to insomnia, hot flashes, mood changes, vaginal dryness, bone density loss, and weight gain. We strongly believe that the benefits far outweigh the risks. Patient understands these risks and consented to starting treatment. Planned treatment duration is 7 years.  Patient will receive Herceptin maintenance every 3 weeks I will see the patient every 6 weeks with labs.

## 2018-12-01 NOTE — Patient Instructions (Signed)
[facility] °Discharge Instructions for Patients Receiving Chemotherapy ° °Today you received the following chemotherapy agents Trastuzumab ° °To help prevent nausea and vomiting after your treatment, we encourage you to take your nausea medication as directed. °  °If you develop nausea and vomiting that is not controlled by your nausea medication, call the clinic.  ° °BELOW ARE SYMPTOMS THAT SHOULD BE REPORTED IMMEDIATELY: °· *FEVER GREATER THAN 100.5 F °· *CHILLS WITH OR WITHOUT FEVER °· NAUSEA AND VOMITING THAT IS NOT CONTROLLED WITH YOUR NAUSEA MEDICATION °· *UNUSUAL SHORTNESS OF BREATH °· *UNUSUAL BRUISING OR BLEEDING °· TENDERNESS IN MOUTH AND THROAT WITH OR WITHOUT PRESENCE OF ULCERS °· *URINARY PROBLEMS °· *BOWEL PROBLEMS °· UNUSUAL RASH °Items with * indicate a potential emergency and should be followed up as soon as possible. ° °Feel free to call the clinic should you have any questions or concerns. The clinic phone number is [phone]. ° °Please show the CHEMO ALERT CARD at check-in to the Emergency Department and triage nurse. ° ° °

## 2018-12-01 NOTE — Patient Instructions (Signed)

## 2018-12-02 ENCOUNTER — Other Ambulatory Visit: Payer: Managed Care, Other (non HMO)

## 2018-12-02 ENCOUNTER — Ambulatory Visit: Payer: Managed Care, Other (non HMO)

## 2018-12-02 ENCOUNTER — Ambulatory Visit: Payer: Managed Care, Other (non HMO) | Admitting: Hematology and Oncology

## 2018-12-04 ENCOUNTER — Encounter: Payer: Self-pay | Admitting: *Deleted

## 2018-12-04 NOTE — Progress Notes (Signed)
Dental Clearance faced to Dr. Evelene Croon [phone]

## 2018-12-22 ENCOUNTER — Inpatient Hospital Stay: Payer: Managed Care, Other (non HMO) | Attending: Hematology and Oncology

## 2018-12-22 ENCOUNTER — Other Ambulatory Visit: Payer: Self-pay

## 2018-12-22 VITALS — BP 128/79 | HR 77 | Temp 98.2°F | Resp 17 | Wt 158.8 lb

## 2018-12-22 DIAGNOSIS — Z170 Estrogen receptor positive status [ER+]: Secondary | ICD-10-CM | POA: Insufficient documentation

## 2018-12-22 DIAGNOSIS — Z5112 Encounter for antineoplastic immunotherapy: Secondary | ICD-10-CM | POA: Insufficient documentation

## 2018-12-22 DIAGNOSIS — C50511 Malignant neoplasm of lower-outer quadrant of right female breast: Secondary | ICD-10-CM | POA: Insufficient documentation

## 2018-12-22 MED ORDER — DIPHENHYDRAMINE HCL 25 MG PO CAPS
50.0000 mg | ORAL_CAPSULE | Freq: Once | ORAL | Status: AC
  Administered 2018-12-22: 50 mg via ORAL

## 2018-12-22 MED ORDER — ACETAMINOPHEN 325 MG PO TABS
ORAL_TABLET | ORAL | Status: AC
  Filled 2018-12-22: qty 2

## 2018-12-22 MED ORDER — ACETAMINOPHEN 325 MG PO TABS
650.0000 mg | ORAL_TABLET | Freq: Once | ORAL | Status: AC
  Administered 2018-12-22: 08:00:00 650 mg via ORAL

## 2018-12-22 MED ORDER — SODIUM CHLORIDE 0.9 % IV SOLN
Freq: Once | INTRAVENOUS | Status: AC
  Filled 2018-12-22: qty 250

## 2018-12-22 MED ORDER — SODIUM CHLORIDE 0.9% FLUSH
10.0000 mL | INTRAVENOUS | Status: DC | PRN
  Administered 2018-12-22: 10:00:00 10 mL
  Filled 2018-12-22: qty 10

## 2018-12-22 MED ORDER — HEPARIN SOD (PORK) LOCK FLUSH 100 UNIT/ML IV SOLN
500.0000 [IU] | Freq: Once | INTRAVENOUS | Status: AC | PRN
  Administered 2018-12-22: 500 [IU]
  Filled 2018-12-22: qty 5

## 2018-12-22 MED ORDER — DIPHENHYDRAMINE HCL 25 MG PO CAPS
ORAL_CAPSULE | ORAL | Status: AC
  Filled 2018-12-22: qty 2

## 2018-12-22 MED ORDER — TRASTUZUMAB-DKST CHEMO 150 MG IV SOLR
450.0000 mg | Freq: Once | INTRAVENOUS | Status: AC
  Administered 2018-12-22: 10:00:00 450 mg via INTRAVENOUS
  Filled 2018-12-22: qty 21.43

## 2018-12-22 NOTE — Patient Instructions (Addendum)
[facility] Discharge Instructions for Patients Receiving Chemotherapy  Today you received the following chemotherapy agents trastuzumab-dkst   To help prevent nausea and vomiting after your treatment, we encourage you to take your nausea medication as directed.   If you develop nausea and vomiting that is not controlled by your nausea medication, call the clinic.   BELOW ARE SYMPTOMS THAT SHOULD BE REPORTED IMMEDIATELY:  *FEVER GREATER THAN 100.5 F  *CHILLS WITH OR WITHOUT FEVER  NAUSEA AND VOMITING THAT IS NOT CONTROLLED WITH YOUR NAUSEA MEDICATION  *UNUSUAL SHORTNESS OF BREATH  *UNUSUAL BRUISING OR BLEEDING  TENDERNESS IN MOUTH AND THROAT WITH OR WITHOUT PRESENCE OF ULCERS  *URINARY PROBLEMS  *BOWEL PROBLEMS  UNUSUAL RASH Items with * indicate a potential emergency and should be followed up as soon as possible.  Feel free to call the clinic should you have any questions or concerns. The clinic phone number is [phone].  Please show the CHEMO ALERT CARD at check-in to the Emergency Department and triage nurse.   

## 2018-12-23 ENCOUNTER — Ambulatory Visit: Payer: Managed Care, Other (non HMO)

## 2019-01-01 ENCOUNTER — Encounter: Payer: Self-pay | Admitting: *Deleted

## 2019-01-11 NOTE — Progress Notes (Signed)
Patient Care Team: [name], MD as PCP - General (Family Medicine) Mauro Kaufmann, RN as Oncology Nurse Navigator Rockwell Germany, RN as Oncology Nurse Navigator  DIAGNOSIS:    ICD-10-CM   1. Malignant neoplasm of lower-outer quadrant of right breast of female, estrogen receptor positive ([name])  C50.511    Z17.0     SUMMARY OF ONCOLOGIC HISTORY: Oncology History  Malignant neoplasm of lower-outer quadrant of right breast of female, estrogen receptor positive ([name])  03/06/2018 Initial Diagnosis   Screening mammogram detected right breast calcifications, 8 mm mass detected by ultrasound, axilla negative, biopsy revealed grade 2 IDC with high-grade DCIS, ER 100%, PR 1%, Ki-67 30%, HER-2 +3+ by IHC, T1BN0 stage Ia   03/19/2018 Cancer Staging   Staging form: Breast, AJCC 8th Edition - Clinical stage from 03/19/2018: Stage IA (cT1b, cN0, cM0, G2, ER+, PR+, HER2+) - Signed by Nicholas Lose, MD on 03/19/2018   03/24/2018 Surgery   Right lumpectomy [name]): IDC with DCIS, grade 3, 0.6cm, ER+ (100%), PR+ (1%), HER2+ (3+), Ki67 30%, clear margins, 0.4 SLN negative for carcinoma.    05/14/2018 -  Chemotherapy   The patient had trastuzumab (HERCEPTIN) 300 mg in sodium chloride 0.9 % 250 mL chemo infusion, 273 mg, Intravenous,  Once, 4 of 4 cycles Dose modification: 6 mg/kg (original dose 2 mg/kg, Cycle 3, Reason: Other (see comments), Comment: switch to maintenance Herceptin - every 3 weeks), 6 mg/kg (original dose 6 mg/kg, Cycle 4, Reason: Other (see comments), Comment: bios not approved, herc again today as it is appr) Administration: 300 mg (05/14/2018), 150 mg (05/20/2018), 150 mg (06/10/2018), 147 mg (05/27/2018), 147 mg (06/03/2018), 150 mg (06/17/2018), 150 mg (06/24/2018), 150 mg (07/01/2018), 150 mg (07/08/2018), 150 mg (07/15/2018), 150 mg (07/22/2018), 450 mg (07/29/2018), 450 mg (08/18/2018) PACLitaxel (TAXOL) 138 mg in sodium chloride 0.9 % 250 mL chemo infusion (</= 38m/m2), 80 mg/m2 = 138 mg,  Intravenous,  Once, 3 of 3 cycles Dose modification: 65 mg/m2 (original dose 80 mg/m2, Cycle 2, Reason: Dose not tolerated), 50 mg/m2 (original dose 80 mg/m2, Cycle 3, Reason: Dose not tolerated) Administration: 138 mg (05/14/2018), 138 mg (05/20/2018), 114 mg (06/10/2018), 114 mg (05/27/2018), 114 mg (06/03/2018), 114 mg (06/17/2018), 114 mg (06/24/2018), 114 mg (07/01/2018), 114 mg (07/08/2018), 90 mg (07/15/2018), 90 mg (07/22/2018) trastuzumab-dkst (OGIVRI) 450 mg in sodium chloride 0.9 % 250 mL chemo infusion, 450 mg (100 % of original dose 450 mg), Intravenous,  Once, 7 of 13 cycles Dose modification: 450 mg (original dose 450 mg, Cycle 4, Reason: Other (see comments), Comment: Biosimilar Conversion) Administration: 450 mg (09/09/2018), 450 mg (09/29/2018), 450 mg (10/20/2018), 450 mg (11/10/2018), 450 mg (12/01/2018), 450 mg (12/22/2018)  for chemotherapy treatment.    08/2018 -  Radiation Therapy   [facility]   09/2018 -  Anti-estrogen oral therapy   Tamoxifen 20 mg daily     CHIEF COMPLIANT: Follow-up of right breast cancer onHerceptin maintenance  INTERVAL HISTORY: Kim Petrellais a 59y.o. with above-mentioned history of right breast cancer who underwent a lumpectomy, adjuvant chemotherapy, and radiation. She is currently on Herceptin maintenance. She presents to the clinic todayfor treatment. She is tolerating Herceptin extremely well.  She has good appetite and energy levels.  Denies any nausea or vomiting.  She is going to be due for mammogram in February.  ALLERGIES:  has No Known Allergies.  MEDICATIONS:  Current Outpatient Medications  Medication Sig Dispense Refill  .  albuterol (PROVENTIL HFA;VENTOLIN HFA) 108 (90 Base) MCG/ACT inhaler Inhale into the lungs every 6 (six) hours as needed for wheezing or shortness of breath.    [name] atenolol-chlorthalidone (TENORETIC) 100-25 MG tablet Take 1 tablet by mouth daily.    . clonazePAM (KLONOPIN) 0.5 MG  tablet Take 0.5 mg by mouth at bedtime.    . lidocaine (XYLOCAINE) 2 % solution Use as directed 5 mLs in the mouth or throat every 3 (three) hours as needed for mouth pain. Swish and spit 200 mL 2  . lidocaine-prilocaine (EMLA) cream Apply to affected area once 30 g 3  . magic mouthwash SOLN Take 5 mLs by mouth 4 (four) times daily as needed for mouth pain. 240 mL 3  . Melatonin 10 MG TABS Take by mouth.    [name] omeprazole (PRILOSEC) 20 MG capsule Take 20 mg by mouth daily.    . ondansetron (ZOFRAN) 8 MG tablet Take 1 tablet (8 mg total) by mouth 2 (two) times daily as needed (Nausea or vomiting). 30 tablet 1  . Oral Wound Care Products [facility]) LIQD Use as directed 5 mLs in the mouth or throat 6 (six) times daily. 240 mL 3  . potassium chloride SA (K-DUR,KLOR-CON) 20 MEQ tablet Take 20 mEq by mouth 2 (two) times daily.    . prochlorperazine (COMPAZINE) 10 MG tablet Take 1 tablet (10 mg total) by mouth every 6 (six) hours as needed (Nausea or vomiting). 30 tablet 1  . tamoxifen (NOLVADEX) 20 MG tablet Take 1 tablet (20 mg total) by mouth daily. 30 tablet 2   No current facility-administered medications for this visit.   Facility-Administered Medications Ordered in Other Visits  Medication Dose Route Frequency Provider Last Rate Last Admin  . sodium chloride flush (NS) 0.9 % injection 10 mL  10 mL Intracatheter PRN Nicholas Lose, MD   10 mL at 01/12/19 0818    PHYSICAL EXAMINATION: ECOG PERFORMANCE STATUS: 1 - Symptomatic but completely ambulatory  There were no vitals filed for this visit. There were no vitals filed for this visit.  LABORATORY DATA:  I have reviewed the data as listed CMP Latest Ref Rng & Units 12/01/2018 10/20/2018 09/09/2018  Glucose 70 - 99 mg/dL 98 100(H) 96  BUN 6 - 20 mg/dL '16 17 16  ' Creatinine 0.44 - 1.00 mg/dL 0.80 0.79 0.78  Sodium 135 - 145 mmol/L 140 140 138  Potassium 3.5 - 5.1 mmol/L 3.7 3.9 3.8  Chloride 98 - 111 mmol/L 103 102 103  CO2 22 - 32 mmol/L '26 28  27  ' Calcium 8.9 - 10.3 mg/dL 9.1 9.7 9.5  Total Protein 6.5 - 8.1 g/dL 7.0 7.2 7.2  Total Bilirubin 0.3 - 1.2 mg/dL 0.7 0.5 0.5  Alkaline Phos 38 - 126 U/L 79 83 74  AST 15 - 41 U/L 35 33 27  ALT 0 - 44 U/L '28 31 21    ' Lab Results  Component Value Date   WBC 5.4 12/01/2018   HGB 12.3 12/01/2018   HCT 36.9 12/01/2018   MCV 88.7 12/01/2018   PLT 154 12/01/2018   NEUTROABS 3.4 12/01/2018    ASSESSMENT & PLAN:  Malignant neoplasm of lower-outer quadrant of right breast of female, estrogen receptor positive ([place]) 03/06/2018:Screening mammogram detected right breast calcifications, 8 mm mass detected by ultrasound, axilla negative, biopsy revealed grade 2 IDC with high-grade DCIS, ER 100%, PR 1%, Ki-67 30%, HER-2 +3+ by IHC, T1BN0 stage Ia  3.23.20: Rt Lumpectomy Grade 2 IDC 0.6 cm, 0/4  LN Neg, Er 100%, PR 1%, Her 2: 3+ pos, T1bN0 Stage 1A  Treatment plan: 1. Adj Chemo with Taxol-Herceptin weekly X 11 (stopped early for neuropathy)followed by Herceptin q 3 weeks for a year. 2. Adj RT completed September 2020 (Dr. Pablo Ledger) 3. Foll by Adj Anti estrogen therapy with tamoxifen started September 2020. ------------------------------------------------------------------------------------------------------------------------------------------- Current treatment: Herceptinmaintenance which will be completed end of April 2021. Echocardiogram 11/17/2018: EF 60 to 65%   Herceptin toxicities: Being monitored closely Chemo-induced peripheral neuropathy: Very mild symptoms improving Carpal tunnel syndrome Fatigue: Improving slowly with time  Survivorship: Discussed importance of exercise and watching her diet and eating more fruits and vegetables. Vitamin D level and hemoglobin A1c will be checked because of her hyperglycemia history and family history of diabetes.  She also has symptoms of vitamin D deficiency.  Return to clinic every 3 weeks for Herceptin every 6 weeks to follow-up with  me.     No orders of the defined types were placed in this encounter.  The patient has a good understanding of the overall plan. she agrees with it. she will call with any problems that may develop before the next visit here.  Total time spent: 30 mins including face to face time and time spent for planning, charting and coordination of care  Nicholas Lose, MD 01/12/2019  I, Cloyde Reams Dorshimer, am acting as scribe for Dr. Nicholas Lose.  I have reviewed the above documentation for accuracy and completeness, and I agree with the above.

## 2019-01-12 ENCOUNTER — Inpatient Hospital Stay: Payer: Managed Care, Other (non HMO) | Attending: Hematology and Oncology | Admitting: Hematology and Oncology

## 2019-01-12 ENCOUNTER — Inpatient Hospital Stay: Payer: Managed Care, Other (non HMO)

## 2019-01-12 ENCOUNTER — Other Ambulatory Visit: Payer: Self-pay

## 2019-01-12 ENCOUNTER — Encounter: Payer: Self-pay | Admitting: *Deleted

## 2019-01-12 VITALS — BP 120/77 | HR 74 | Temp 97.8°F | Resp 18 | Ht 61.0 in | Wt 161.0 lb

## 2019-01-12 DIAGNOSIS — G620 Drug-induced polyneuropathy: Secondary | ICD-10-CM | POA: Diagnosis not present

## 2019-01-12 DIAGNOSIS — Z923 Personal history of irradiation: Secondary | ICD-10-CM | POA: Insufficient documentation

## 2019-01-12 DIAGNOSIS — C50511 Malignant neoplasm of lower-outer quadrant of right female breast: Secondary | ICD-10-CM | POA: Diagnosis present

## 2019-01-12 DIAGNOSIS — Z170 Estrogen receptor positive status [ER+]: Secondary | ICD-10-CM | POA: Diagnosis not present

## 2019-01-12 DIAGNOSIS — Z79811 Long term (current) use of aromatase inhibitors: Secondary | ICD-10-CM | POA: Insufficient documentation

## 2019-01-12 DIAGNOSIS — K219 Gastro-esophageal reflux disease without esophagitis: Secondary | ICD-10-CM | POA: Diagnosis not present

## 2019-01-12 DIAGNOSIS — Z95828 Presence of other vascular implants and grafts: Secondary | ICD-10-CM

## 2019-01-12 DIAGNOSIS — Z0000 Encounter for general adult medical examination without abnormal findings: Secondary | ICD-10-CM

## 2019-01-12 DIAGNOSIS — Z5112 Encounter for antineoplastic immunotherapy: Secondary | ICD-10-CM | POA: Diagnosis not present

## 2019-01-12 DIAGNOSIS — R5383 Other fatigue: Secondary | ICD-10-CM | POA: Insufficient documentation

## 2019-01-12 DIAGNOSIS — Z79899 Other long term (current) drug therapy: Secondary | ICD-10-CM | POA: Insufficient documentation

## 2019-01-12 LAB — CMP (CANCER CENTER ONLY)
ALT: 34 U/L (ref 0–44)
AST: 38 U/L (ref 15–41)
Albumin: 3.6 g/dL (ref 3.5–5.0)
Alkaline Phosphatase: 73 U/L (ref 38–126)
Anion gap: 6 (ref 5–15)
BUN: 16 mg/dL (ref 6–20)
CO2: 28 mmol/L (ref 22–32)
Calcium: 9.1 mg/dL (ref 8.9–10.3)
Chloride: 104 mmol/L (ref 98–111)
Creatinine: 0.78 mg/dL (ref 0.44–1.00)
GFR, Est AFR Am: >60 mL/min (ref >60)
GFR, Estimated: >60 mL/min (ref >60)
Glucose, Bld: 98 mg/dL (ref 70–99)
Potassium: 3.8 mmol/L (ref 3.5–5.1)
Sodium: 138 mmol/L (ref 135–145)
Total Bilirubin: 0.5 mg/dL (ref 0.3–1.2)
Total Protein: 6.8 g/dL (ref 6.5–8.1)

## 2019-01-12 LAB — CBC WITH DIFFERENTIAL (CANCER CENTER ONLY)
Abs Immature Granulocytes: 0.01 10*3/uL (ref 0.00–0.07)
Basophils Absolute: 0 10*3/uL (ref 0.0–0.1)
Basophils Relative: 0 %
Eosinophils Absolute: 0.2 10*3/uL (ref 0.0–0.5)
Eosinophils Relative: 5 %
HCT: 36.4 % (ref 36.0–46.0)
Hemoglobin: 12.3 g/dL (ref 12.0–15.0)
Immature Granulocytes: 0 %
Lymphocytes Relative: 23 %
Lymphs Abs: 1.2 10*3/uL (ref 0.7–4.0)
MCH: 31 pg (ref 26.0–34.0)
MCHC: 33.8 g/dL (ref 30.0–36.0)
MCV: 91.7 fL (ref 80.0–100.0)
Monocytes Absolute: 0.5 10*3/uL (ref 0.1–1.0)
Monocytes Relative: 10 %
Neutro Abs: 3.2 10*3/uL (ref 1.7–7.7)
Neutrophils Relative %: 62 %
Platelet Count: 161 10*3/uL (ref 150–400)
RBC: 3.97 MIL/uL (ref 3.87–5.11)
RDW: 13.5 % (ref 11.5–15.5)
WBC Count: 5.1 10*3/uL (ref 4.0–10.5)
nRBC: 0 % (ref 0.0–0.2)

## 2019-01-12 LAB — VITAMIN D 25 HYDROXY (VIT D DEFICIENCY, FRACTURES): Vit D, 25-Hydroxy: 14.73 ng/mL — ABNORMAL LOW (ref 30–100)

## 2019-01-12 LAB — HEMOGLOBIN A1C
Hgb A1c MFr Bld: 5.4 % (ref 4.8–5.6)
Mean Plasma Glucose: 108.28 mg/dL

## 2019-01-12 MED ORDER — SODIUM CHLORIDE 0.9% FLUSH
10.0000 mL | INTRAVENOUS | Status: DC | PRN
  Administered 2019-01-12: 10 mL
  Filled 2019-01-12: qty 10

## 2019-01-12 MED ORDER — HEPARIN SOD (PORK) LOCK FLUSH 100 UNIT/ML IV SOLN
500.0000 [IU] | Freq: Once | INTRAVENOUS | Status: AC | PRN
  Administered 2019-01-12: 500 [IU]
  Filled 2019-01-12: qty 5

## 2019-01-12 MED ORDER — TRASTUZUMAB-DKST CHEMO 150 MG IV SOLR
450.0000 mg | Freq: Once | INTRAVENOUS | Status: AC
  Administered 2019-01-12: 450 mg via INTRAVENOUS
  Filled 2019-01-12: qty 21.43

## 2019-01-12 MED ORDER — ACETAMINOPHEN 325 MG PO TABS
ORAL_TABLET | ORAL | Status: AC
  Filled 2019-01-12: qty 2

## 2019-01-12 MED ORDER — DIPHENHYDRAMINE HCL 25 MG PO CAPS
50.0000 mg | ORAL_CAPSULE | Freq: Once | ORAL | Status: AC
  Administered 2019-01-12: 50 mg via ORAL

## 2019-01-12 MED ORDER — ACETAMINOPHEN 325 MG PO TABS
650.0000 mg | ORAL_TABLET | Freq: Once | ORAL | Status: AC
  Administered 2019-01-12: 650 mg via ORAL

## 2019-01-12 MED ORDER — DIPHENHYDRAMINE HCL 25 MG PO CAPS
ORAL_CAPSULE | ORAL | Status: AC
  Filled 2019-01-12: qty 2

## 2019-01-12 MED ORDER — SODIUM CHLORIDE 0.9 % IV SOLN
Freq: Once | INTRAVENOUS | Status: AC
  Filled 2019-01-12: qty 250

## 2019-01-12 NOTE — Patient Instructions (Signed)
[facility] Discharge Instructions for Patients Receiving Chemotherapy  Today you received the following chemotherapy agents: Trastuzumab-dkst Kim Montgomery)  To help prevent nausea and vomiting after your treatment, we encourage you to take your nausea medication as directed.    If you develop nausea and vomiting that is not controlled by your nausea medication, call the clinic.   BELOW ARE SYMPTOMS THAT SHOULD BE REPORTED IMMEDIATELY:  *FEVER GREATER THAN 100.5 F  *CHILLS WITH OR WITHOUT FEVER  NAUSEA AND VOMITING THAT IS NOT CONTROLLED WITH YOUR NAUSEA MEDICATION  *UNUSUAL SHORTNESS OF BREATH  *UNUSUAL BRUISING OR BLEEDING  TENDERNESS IN MOUTH AND THROAT WITH OR WITHOUT PRESENCE OF ULCERS  *URINARY PROBLEMS  *BOWEL PROBLEMS  UNUSUAL RASH Items with * indicate a potential emergency and should be followed up as soon as possible.  Feel free to call the clinic should you have any questions or concerns. The clinic phone number is [phone].  Please show the [place] at check-in to the Emergency Department and triage nurse.  Coronavirus (COVID-19) Are you at risk?  Are you at risk for the Coronavirus (COVID-19)?  To be considered HIGH RISK for Coronavirus (COVID-19), you have to meet the following criteria:  . Traveled to [place], [place], [place], [place] or [place]; or in the [place] to [place], [place], [place], or [place]; and have fever, cough, and shortness of breath within the last 2 weeks of travel OR . Been in close contact with a person diagnosed with COVID-19 within the last 2 weeks and have fever, cough, and shortness of breath . IF YOU DO NOT MEET THESE CRITERIA, YOU ARE CONSIDERED LOW RISK FOR COVID-19.  What to do if you are HIGH RISK for COVID-19?  [name] If you are having a medical emergency, call 911. . Seek medical care right away. Before you go to a doctor's office, urgent care or emergency department, call ahead and tell  them about your recent travel, contact with someone diagnosed with COVID-19, and your symptoms. You should receive instructions from your physician's office regarding next steps of care.  . When you arrive at healthcare provider, tell the healthcare staff immediately you have returned from visiting [place], [place], [place], [place] or [place]; or traveled in the [place] to [place], [place], [place], or [place]; in the last two weeks or you have been in close contact with a person diagnosed with COVID-19 in the last 2 weeks.   . Tell the health care staff about your symptoms: fever, cough and shortness of breath. . After you have been seen by a medical provider, you will be either: o Tested for (COVID-19) and discharged home on quarantine except to seek medical care if symptoms worsen, and asked to  - Stay home and avoid contact with others until you get your results (4-5 days)  - Avoid travel on public transportation if possible (such as bus, train, or airplane) or o Sent to the Emergency Department by EMS for evaluation, COVID-19 testing, and possible admission depending on your condition and test results.  What to do if you are LOW RISK for COVID-19?  Reduce your risk of any infection by using the same precautions used for avoiding the common cold or flu:  [name] Wash your hands often with soap and warm water for at least 20 seconds.  If soap and water are not readily available, use an alcohol-based hand sanitizer with at least 60% alcohol.  . If coughing  or sneezing, cover your mouth and nose by coughing or sneezing into the elbow areas of your shirt or coat, into a tissue or into your sleeve (not your hands). . Avoid shaking hands with others and consider head nods or verbal greetings only. . Avoid touching your eyes, nose, or mouth with unwashed hands.  . Avoid close contact with people who are sick. . Avoid places or events with large numbers of people in one location, like concerts or  sporting events. . Carefully consider travel plans you have or are making. . If you are planning any travel outside or inside the [place], visit the CDC's Travelers' Health webpage for the latest health notices. . If you have some symptoms but not all symptoms, continue to monitor at home and seek medical attention if your symptoms worsen. . If you are having a medical emergency, call 911.   ADDITIONAL HEALTHCARE OPTIONS FOR PATIENTS  [facility] / e-Visit: [email or web address]         [facility]: [phone]  [facility]: [phone]                   [facility]: [phone]   

## 2019-01-12 NOTE — Assessment & Plan Note (Signed)
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 Adj Chemo with Taxol-Herceptin weekly X 11 (stopped early for neuropathy)followed by Herceptin q 3 weeks for a year. 2. Adj RT completed September 2020 (Dr. Pablo Ledger) 3. Foll by Adj Anti estrogen therapy with tamoxifen started September 2020. ------------------------------------------------------------------------------------------------------------------------------------------- Current treatment: Herceptinmaintenance which will be completed in May 2021. 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  Return to clinic every 3 weeks for Herceptin every 6 weeks to follow-up with me.

## 2019-01-26 ENCOUNTER — Other Ambulatory Visit: Payer: Self-pay | Admitting: Hematology and Oncology

## 2019-02-02 ENCOUNTER — Other Ambulatory Visit: Payer: Self-pay

## 2019-02-02 ENCOUNTER — Inpatient Hospital Stay: Payer: Managed Care, Other (non HMO) | Attending: Hematology and Oncology

## 2019-02-02 VITALS — BP 118/69 | HR 75 | Temp 98.4°F | Resp 18

## 2019-02-02 DIAGNOSIS — Z79811 Long term (current) use of aromatase inhibitors: Secondary | ICD-10-CM | POA: Diagnosis not present

## 2019-02-02 DIAGNOSIS — Z5112 Encounter for antineoplastic immunotherapy: Secondary | ICD-10-CM | POA: Insufficient documentation

## 2019-02-02 DIAGNOSIS — Z170 Estrogen receptor positive status [ER+]: Secondary | ICD-10-CM | POA: Insufficient documentation

## 2019-02-02 DIAGNOSIS — K219 Gastro-esophageal reflux disease without esophagitis: Secondary | ICD-10-CM | POA: Diagnosis not present

## 2019-02-02 DIAGNOSIS — C50511 Malignant neoplasm of lower-outer quadrant of right female breast: Secondary | ICD-10-CM | POA: Diagnosis present

## 2019-02-02 DIAGNOSIS — Z791 Long term (current) use of non-steroidal anti-inflammatories (NSAID): Secondary | ICD-10-CM | POA: Diagnosis not present

## 2019-02-02 DIAGNOSIS — Z79899 Other long term (current) drug therapy: Secondary | ICD-10-CM | POA: Insufficient documentation

## 2019-02-02 DIAGNOSIS — G620 Drug-induced polyneuropathy: Secondary | ICD-10-CM | POA: Insufficient documentation

## 2019-02-02 MED ORDER — TRASTUZUMAB-DKST CHEMO 150 MG IV SOLR
450.0000 mg | Freq: Once | INTRAVENOUS | Status: AC
  Administered 2019-02-02: 450 mg via INTRAVENOUS
  Filled 2019-02-02: qty 21.43

## 2019-02-02 MED ORDER — DIPHENHYDRAMINE HCL 25 MG PO CAPS
25.0000 mg | ORAL_CAPSULE | Freq: Once | ORAL | Status: AC
  Administered 2019-02-02: 09:00:00 25 mg via ORAL

## 2019-02-02 MED ORDER — ACETAMINOPHEN 325 MG PO TABS
ORAL_TABLET | ORAL | Status: AC
  Filled 2019-02-02: qty 2

## 2019-02-02 MED ORDER — SODIUM CHLORIDE 0.9% FLUSH
10.0000 mL | INTRAVENOUS | Status: DC | PRN
  Administered 2019-02-02: 10:00:00 10 mL
  Filled 2019-02-02: qty 10

## 2019-02-02 MED ORDER — DIPHENHYDRAMINE HCL 25 MG PO CAPS: 50.0000 mg | ORAL_CAPSULE | Freq: Once | ORAL | Status: DC

## 2019-02-02 MED ORDER — DIPHENHYDRAMINE HCL 25 MG PO CAPS
ORAL_CAPSULE | ORAL | Status: AC
  Filled 2019-02-02: qty 1

## 2019-02-02 MED ORDER — ACETAMINOPHEN 325 MG PO TABS
650.0000 mg | ORAL_TABLET | Freq: Once | ORAL | Status: AC
  Administered 2019-02-02: 650 mg via ORAL

## 2019-02-02 MED ORDER — SODIUM CHLORIDE 0.9 % IV SOLN
Freq: Once | INTRAVENOUS | Status: AC
  Filled 2019-02-02: qty 250

## 2019-02-02 MED ORDER — HEPARIN SOD (PORK) LOCK FLUSH 100 UNIT/ML IV SOLN
500.0000 [IU] | Freq: Once | INTRAVENOUS | Status: AC | PRN
  Administered 2019-02-02: 10:00:00 500 [IU]
  Filled 2019-02-02: qty 5

## 2019-02-02 NOTE — Patient Instructions (Signed)
[facility] Discharge Instructions for Patients Receiving Chemotherapy  Today you received the following chemotherapy agents: Trastuzumab   To help prevent nausea and vomiting after your treatment, we encourage you to take your nausea medication  as prescribed.    If you develop nausea and vomiting that is not controlled by your nausea medication, call the clinic.   BELOW ARE SYMPTOMS THAT SHOULD BE REPORTED IMMEDIATELY:  *FEVER GREATER THAN 100.5 F  *CHILLS WITH OR WITHOUT FEVER  NAUSEA AND VOMITING THAT IS NOT CONTROLLED WITH YOUR NAUSEA MEDICATION  *UNUSUAL SHORTNESS OF BREATH  *UNUSUAL BRUISING OR BLEEDING  TENDERNESS IN MOUTH AND THROAT WITH OR WITHOUT PRESENCE OF ULCERS  *URINARY PROBLEMS  *BOWEL PROBLEMS  UNUSUAL RASH Items with * indicate a potential emergency and should be followed up as soon as possible.  Feel free to call the clinic should you have any questions or concerns. The clinic phone number is [phone].  Please show the CHEMO ALERT CARD at check-in to the Emergency Department and triage nurse.   

## 2019-02-03 ENCOUNTER — Other Ambulatory Visit: Payer: Self-pay | Admitting: *Deleted

## 2019-02-03 DIAGNOSIS — Z5181 Encounter for therapeutic drug level monitoring: Secondary | ICD-10-CM

## 2019-02-06 ENCOUNTER — Other Ambulatory Visit: Payer: Self-pay | Admitting: Hematology and Oncology

## 2019-02-18 ENCOUNTER — Ambulatory Visit (HOSPITAL_COMMUNITY)
Admission: RE | Admit: 2019-02-18 | Discharge: 2019-02-18 | Disposition: A | Discharge status: Home / Self Care | Payer: Managed Care, Other (non HMO) | Source: Ambulatory Visit | Attending: Hematology and Oncology | Admitting: Hematology and Oncology

## 2019-02-18 ENCOUNTER — Other Ambulatory Visit: Payer: Self-pay

## 2019-02-18 DIAGNOSIS — Z5181 Encounter for therapeutic drug level monitoring: Secondary | ICD-10-CM | POA: Diagnosis not present

## 2019-02-18 DIAGNOSIS — Z79899 Other long term (current) drug therapy: Secondary | ICD-10-CM | POA: Diagnosis not present

## 2019-02-18 DIAGNOSIS — C801 Malignant (primary) neoplasm, unspecified: Secondary | ICD-10-CM | POA: Insufficient documentation

## 2019-02-18 DIAGNOSIS — K219 Gastro-esophageal reflux disease without esophagitis: Secondary | ICD-10-CM | POA: Diagnosis not present

## 2019-02-18 DIAGNOSIS — Z01818 Encounter for other preprocedural examination: Secondary | ICD-10-CM | POA: Insufficient documentation

## 2019-02-18 DIAGNOSIS — I10 Essential (primary) hypertension: Secondary | ICD-10-CM | POA: Insufficient documentation

## 2019-02-18 NOTE — Progress Notes (Signed)
  Echocardiogram 2D Echocardiogram has been performed.  Kim Montgomery G Kim Montgomery 02/18/2019, 11:16 AM

## 2019-02-22 NOTE — Progress Notes (Signed)
Patient Care Team: Kim Shan, MD as PCP - General (Family Medicine) Kim Kaufmann, RN as Oncology Nurse Navigator Kim Germany, RN as Oncology Nurse Navigator  DIAGNOSIS:    ICD-10-CM   1. Malignant neoplasm of lower-outer quadrant of right breast of female, estrogen receptor positive (Kim Montgomery)  C50.511    Z17.0     SUMMARY OF ONCOLOGIC HISTORY: Oncology History  Malignant neoplasm of lower-outer quadrant of right breast of female, estrogen receptor positive ([place])  03/06/2018 Initial Diagnosis   Screening mammogram detected right breast calcifications, 8 mm mass detected by ultrasound, axilla negative, biopsy revealed grade 2 IDC with high-grade DCIS, ER 100%, PR 1%, Ki-67 30%, HER-2 +3+ by IHC, T1BN0 stage Ia   03/19/2018 Cancer Staging   Staging form: Breast, AJCC 8th Edition - Clinical stage from 03/19/2018: Stage IA (cT1b, cN0, cM0, G2, ER+, PR+, HER2+) - Signed by Kim Lose, MD on 03/19/2018   03/24/2018 Surgery   Right lumpectomy Kim Montgomery): IDC with DCIS, grade 3, 0.6cm, ER+ (100%), PR+ (1%), HER2+ (3+), Ki67 30%, clear margins, 0.4 SLN negative for carcinoma.    05/14/2018 -  Chemotherapy   The patient had trastuzumab (HERCEPTIN) 300 mg in sodium chloride 0.9 % 250 mL chemo infusion, 273 mg, Intravenous,  Once, 4 of 4 cycles Dose modification: 6 mg/kg (original dose 2 mg/kg, Cycle 3, Reason: Other (see comments), Comment: switch to maintenance Herceptin - every 3 weeks), 6 mg/kg (original dose 6 mg/kg, Cycle 4, Reason: Other (see comments), Comment: bios not approved, herc again today as it is appr) Administration: 300 mg (05/14/2018), 150 mg (05/20/2018), 150 mg (06/10/2018), 147 mg (05/27/2018), 147 mg (06/03/2018), 150 mg (06/17/2018), 150 mg (06/24/2018), 150 mg (07/01/2018), 150 mg (07/08/2018), 150 mg (07/15/2018), 150 mg (07/22/2018), 450 mg (07/29/2018), 450 mg (08/18/2018) PACLitaxel (TAXOL) 138 mg in sodium chloride 0.9 % 250 mL chemo infusion (</= 23m/m2), 80 mg/m2 = 138 mg,  Intravenous,  Once, 3 of 3 cycles Dose modification: 65 mg/m2 (original dose 80 mg/m2, Cycle 2, Reason: Dose not tolerated), 50 mg/m2 (original dose 80 mg/m2, Cycle 3, Reason: Dose not tolerated) Administration: 138 mg (05/14/2018), 138 mg (05/20/2018), 114 mg (06/10/2018), 114 mg (05/27/2018), 114 mg (06/03/2018), 114 mg (06/17/2018), 114 mg (06/24/2018), 114 mg (07/01/2018), 114 mg (07/08/2018), 90 mg (07/15/2018), 90 mg (07/22/2018) trastuzumab-dkst (OGIVRI) 450 mg in sodium chloride 0.9 % 250 mL chemo infusion, 450 mg (100 % of original dose 450 mg), Intravenous,  Once, 9 of 13 cycles Dose modification: 450 mg (original dose 450 mg, Cycle 4, Reason: Other (see comments), Comment: Biosimilar Conversion) Administration: 450 mg (09/09/2018), 450 mg (09/29/2018), 450 mg (10/20/2018), 450 mg (11/10/2018), 450 mg (12/01/2018), 450 mg (12/22/2018), 450 mg (01/12/2019), 450 mg (02/02/2019)  for chemotherapy treatment.    08/2018 -  Radiation Therapy   [facility]   09/2018 -  Anti-estrogen oral therapy   Tamoxifen 20 mg daily     CHIEF COMPLIANT: Follow-up of right breast cancer onHerceptin maintenance  INTERVAL HISTORY: RCamellia Popescuis a 59y.o. with above-mentioned history of right breast cancer who underwent a lumpectomy, adjuvant chemotherapy, and radiation. She is currently on Herceptin maintenance. Echo on 02/18/19 showed an ejection fraction of 60-65%. She presents to the clinic todayfor treatment. Tolerating the treatment well.  Continues to have right-sided breast discomfort.  Mild neuropathy at the tips of her index and thumb fingers of the right hand.  Especially worse when the weather is  cold.  ALLERGIES:  has No Known Allergies.  MEDICATIONS:  Current Outpatient Medications  Medication Sig Dispense Refill  . albuterol (PROVENTIL HFA;VENTOLIN HFA) 108 (90 Base) MCG/ACT inhaler Inhale into the lungs every 6 (six) hours as needed for wheezing or shortness of  breath.    [name] atenolol-chlorthalidone (TENORETIC) 100-25 MG tablet Take 1 tablet by mouth daily.    . cholecalciferol (VITAMIN D3) 25 MCG (1000 UNIT) tablet Take 2,000 Units by mouth daily.    . clonazePAM (KLONOPIN) 0.5 MG tablet Take 0.5 mg by mouth at bedtime.    . diclofenac Sodium (VOLTAREN) 1 % GEL Apply 1 % topically 2 (two) times daily.    [name] lidocaine-prilocaine (EMLA) cream Apply to affected area once 30 g 3  . Melatonin 10 MG TABS Take by mouth.    [name] omeprazole (PRILOSEC) 20 MG capsule Take 20 mg by mouth daily.    . potassium chloride SA (K-DUR,KLOR-CON) 20 MEQ tablet Take 20 mEq by mouth 2 (two) times daily.    . tamoxifen (NOLVADEX) 20 MG tablet TAKE 1 TABLET BY MOUTH EVERY DAY 90 tablet 0   No current facility-administered medications for this visit.    PHYSICAL EXAMINATION: ECOG PERFORMANCE STATUS: 1 - Symptomatic but completely ambulatory  Vitals:   02/23/19 0832  BP: 126/71  Pulse: 78  Resp: 18  Temp: 98.2 F (36.8 C)  SpO2: 98%   Filed Weights   02/23/19 0832  Weight: 161 lb 12.8 oz (73.4 kg)    LABORATORY DATA:  I have reviewed the data as listed CMP Latest Ref Rng & Units 01/12/2019 12/01/2018 10/20/2018  Glucose 70 - 99 mg/dL 98 98 100(H)  BUN 6 - 20 mg/dL '16 16 17  ' Creatinine 0.44 - 1.00 mg/dL 0.78 0.80 0.79  Sodium 135 - 145 mmol/L 138 140 140  Potassium 3.5 - 5.1 mmol/L 3.8 3.7 3.9  Chloride 98 - 111 mmol/L 104 103 102  CO2 22 - 32 mmol/L '28 26 28  ' Calcium 8.9 - 10.3 mg/dL 9.1 9.1 9.7  Total Protein 6.5 - 8.1 g/dL 6.8 7.0 7.2  Total Bilirubin 0.3 - 1.2 mg/dL 0.5 0.7 0.5  Alkaline Phos 38 - 126 U/L 73 79 83  AST 15 - 41 U/L 38 35 33  ALT 0 - 44 U/L 34 28 31    Lab Results  Component Value Date   WBC 4.6 02/23/2019   HGB 12.7 02/23/2019   HCT 37.0 02/23/2019   MCV 91.4 02/23/2019   PLT 154 02/23/2019   NEUTROABS 2.7 02/23/2019    ASSESSMENT & PLAN:  Malignant neoplasm of lower-outer quadrant of right breast of female, estrogen receptor  positive ([place]) 03/06/2018:Screening mammogram detected right breast calcifications, 8 mm mass detected by ultrasound, axilla negative, 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 Adj Chemo with Taxol-Herceptin weekly X 11 (stopped early for neuropathy)followed by Herceptin q 3 weeks for a year. 2. Adj RTcompleted September 2020 (Dr. Pablo Ledger) 3. Foll by Adj Anti estrogen therapywith tamoxifen started September 2020. ------------------------------------------------------------------------------------------------------------------------------------------- Current treatment: Herceptinmaintenance which will be completed end of April 2021. Echocardiogram 11/17/2018: EF 60 to 65%   Herceptin toxicities: Being monitored closely Chemo-induced peripheral neuropathy:  Mostly at the tips of right index and thumb Carpal tunnel syndrome Fatigue: Resolved  Vitamin D deficiency: We will check vitamin D level today.  Patient tells me that she will be seeing Dr. Sofie Rower for gastroenterology evaluation and colonoscopy. Breast cancer surveillance: Patient has a mammogram appointment coming soon.  Return to clinic every 3 weeks for Herceptin every 6 weeks to follow-up with me.     No orders of the defined types were placed in this encounter.  The patient has a good understanding of the overall plan. she agrees with it. she will call with any problems that may develop before the next visit here.  Total time spent: 30 mins including face to face time and time spent for planning, charting and coordination of care  Kim Lose, MD 02/23/2019  I, Cloyde Reams Dorshimer, am acting as scribe for Dr. Nicholas Montgomery.  I have reviewed the above documentation for accuracy and completeness, and I agree with the  above.

## 2019-02-23 ENCOUNTER — Inpatient Hospital Stay: Payer: Managed Care, Other (non HMO) | Admitting: Hematology and Oncology

## 2019-02-23 ENCOUNTER — Other Ambulatory Visit: Payer: Self-pay

## 2019-02-23 ENCOUNTER — Inpatient Hospital Stay: Payer: Managed Care, Other (non HMO)

## 2019-02-23 ENCOUNTER — Encounter: Payer: Self-pay | Admitting: *Deleted

## 2019-02-23 VITALS — BP 126/71 | HR 78 | Temp 98.2°F | Resp 18 | Ht 61.0 in | Wt 161.8 lb

## 2019-02-23 DIAGNOSIS — E559 Vitamin D deficiency, unspecified: Secondary | ICD-10-CM

## 2019-02-23 DIAGNOSIS — C50511 Malignant neoplasm of lower-outer quadrant of right female breast: Secondary | ICD-10-CM

## 2019-02-23 DIAGNOSIS — Z170 Estrogen receptor positive status [ER+]: Secondary | ICD-10-CM

## 2019-02-23 DIAGNOSIS — Z5112 Encounter for antineoplastic immunotherapy: Secondary | ICD-10-CM | POA: Diagnosis not present

## 2019-02-23 DIAGNOSIS — Z95828 Presence of other vascular implants and grafts: Secondary | ICD-10-CM

## 2019-02-23 LAB — CBC WITH DIFFERENTIAL (CANCER CENTER ONLY)
Abs Immature Granulocytes: 0.01 10*3/uL (ref 0.00–0.07)
Basophils Absolute: 0 10*3/uL (ref 0.0–0.1)
Basophils Relative: 1 %
Eosinophils Absolute: 0.3 10*3/uL (ref 0.0–0.5)
Eosinophils Relative: 7 %
HCT: 37 % (ref 36.0–46.0)
Hemoglobin: 12.7 g/dL (ref 12.0–15.0)
Immature Granulocytes: 0 %
Lymphocytes Relative: 24 %
Lymphs Abs: 1.1 10*3/uL (ref 0.7–4.0)
MCH: 31.4 pg (ref 26.0–34.0)
MCHC: 34.3 g/dL (ref 30.0–36.0)
MCV: 91.4 fL (ref 80.0–100.0)
Monocytes Absolute: 0.5 10*3/uL (ref 0.1–1.0)
Monocytes Relative: 10 %
Neutro Abs: 2.7 10*3/uL (ref 1.7–7.7)
Neutrophils Relative %: 58 %
Platelet Count: 154 10*3/uL (ref 150–400)
RBC: 4.05 MIL/uL (ref 3.87–5.11)
RDW: 12.4 % (ref 11.5–15.5)
WBC Count: 4.6 10*3/uL (ref 4.0–10.5)
nRBC: 0 % (ref 0.0–0.2)

## 2019-02-23 LAB — CMP (CANCER CENTER ONLY)
ALT: 36 U/L (ref 0–44)
AST: 34 U/L (ref 15–41)
Albumin: 3.5 g/dL (ref 3.5–5.0)
Alkaline Phosphatase: 78 U/L (ref 38–126)
Anion gap: 10 (ref 5–15)
BUN: 14 mg/dL (ref 6–20)
CO2: 27 mmol/L (ref 22–32)
Calcium: 8.9 mg/dL (ref 8.9–10.3)
Chloride: 105 mmol/L (ref 98–111)
Creatinine: 0.75 mg/dL (ref 0.44–1.00)
GFR, Est AFR Am: >60 mL/min (ref >60)
GFR, Estimated: >60 mL/min (ref >60)
Glucose, Bld: 87 mg/dL (ref 70–99)
Potassium: 3.7 mmol/L (ref 3.5–5.1)
Sodium: 142 mmol/L (ref 135–145)
Total Bilirubin: 0.3 mg/dL (ref 0.3–1.2)
Total Protein: 6.9 g/dL (ref 6.5–8.1)

## 2019-02-23 LAB — VITAMIN D 25 HYDROXY (VIT D DEFICIENCY, FRACTURES): Vit D, 25-Hydroxy: 37.22 ng/mL (ref 30–100)

## 2019-02-23 MED ORDER — SODIUM CHLORIDE 0.9% FLUSH
10.0000 mL | INTRAVENOUS | Status: DC | PRN
  Administered 2019-02-23: 10 mL
  Filled 2019-02-23: qty 10

## 2019-02-23 MED ORDER — DIPHENHYDRAMINE HCL 25 MG PO CAPS
25.0000 mg | ORAL_CAPSULE | Freq: Once | ORAL | Status: AC
  Administered 2019-02-23: 09:00:00 25 mg via ORAL

## 2019-02-23 MED ORDER — DIPHENHYDRAMINE HCL 25 MG PO CAPS
ORAL_CAPSULE | ORAL | Status: AC
  Filled 2019-02-23: qty 1

## 2019-02-23 MED ORDER — ACETAMINOPHEN 325 MG PO TABS
650.0000 mg | ORAL_TABLET | Freq: Once | ORAL | Status: AC
  Administered 2019-02-23: 650 mg via ORAL

## 2019-02-23 MED ORDER — TRASTUZUMAB-DKST CHEMO 150 MG IV SOLR
450.0000 mg | Freq: Once | INTRAVENOUS | Status: AC
  Administered 2019-02-23: 450 mg via INTRAVENOUS
  Filled 2019-02-23: qty 21.43

## 2019-02-23 MED ORDER — SODIUM CHLORIDE 0.9 % IV SOLN
Freq: Once | INTRAVENOUS | Status: AC
  Filled 2019-02-23: qty 250

## 2019-02-23 MED ORDER — TAMOXIFEN CITRATE 20 MG PO TABS: 20.0000 mg | ORAL_TABLET | Freq: Every day | ORAL | 3 refills | Status: DC

## 2019-02-23 MED ORDER — HEPARIN SOD (PORK) LOCK FLUSH 100 UNIT/ML IV SOLN
500.0000 [IU] | Freq: Once | INTRAVENOUS | Status: AC | PRN
  Administered 2019-02-23: 500 [IU]
  Filled 2019-02-23: qty 5

## 2019-02-23 MED ORDER — ACETAMINOPHEN 325 MG PO TABS
ORAL_TABLET | ORAL | Status: AC
  Filled 2019-02-23: qty 2

## 2019-02-23 NOTE — Patient Instructions (Signed)

## 2019-02-23 NOTE — Assessment & Plan Note (Signed)
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 Adj Chemo with Taxol-Herceptin weekly X 11 (stopped early for neuropathy)followed by Herceptin q 3 weeks for a year. 2. Adj RTcompleted September 2020 (Dr. Pablo Ledger) 3. Foll by Adj Anti estrogen therapywith tamoxifen started September 2020. ------------------------------------------------------------------------------------------------------------------------------------------- Current treatment: Herceptinmaintenance which will be completed end of April 2021. Echocardiogram 11/17/2018: EF 60 to 65%   Herceptin toxicities: Being monitored closely Chemo-induced peripheral neuropathy:  Getting better Carpal tunnel syndrome Fatigue: Resolved  Return to clinic every 3 weeks for Herceptin every 6 weeks to follow-up with me.

## 2019-02-23 NOTE — Patient Instructions (Signed)
[facility] Discharge Instructions for Patients Receiving Chemotherapy  Today you received the following chemotherapy agents Trastuzumab.  To help prevent nausea and vomiting after your treatment, we encourage you to take your nausea medication as directed.   If you develop nausea and vomiting that is not controlled by your nausea medication, call the clinic.   BELOW ARE SYMPTOMS THAT SHOULD BE REPORTED IMMEDIATELY:  *FEVER GREATER THAN 100.5 F  *CHILLS WITH OR WITHOUT FEVER  NAUSEA AND VOMITING THAT IS NOT CONTROLLED WITH YOUR NAUSEA MEDICATION  *UNUSUAL SHORTNESS OF BREATH  *UNUSUAL BRUISING OR BLEEDING  TENDERNESS IN MOUTH AND THROAT WITH OR WITHOUT PRESENCE OF ULCERS  *URINARY PROBLEMS  *BOWEL PROBLEMS  UNUSUAL RASH Items with * indicate a potential emergency and should be followed up as soon as possible.  Feel free to call the clinic you have any questions or concerns. The clinic phone number is [phone].  Please show the CHEMO ALERT CARD at check-in to the Emergency Department and triage nurse.    

## 2019-02-24 ENCOUNTER — Telehealth: Payer: Self-pay | Admitting: Hematology and Oncology

## 2019-02-24 NOTE — Telephone Encounter (Signed)
I left a message regarding schedule  

## 2019-02-27 ENCOUNTER — Other Ambulatory Visit: Payer: Self-pay

## 2019-02-27 ENCOUNTER — Ambulatory Visit
Admission: RE | Admit: 2019-02-27 | Discharge: 2019-02-27 | Disposition: A | Discharge status: Home / Self Care | Payer: Managed Care, Other (non HMO) | Source: Ambulatory Visit | Attending: Hematology and Oncology | Admitting: Hematology and Oncology

## 2019-02-27 DIAGNOSIS — C50511 Malignant neoplasm of lower-outer quadrant of right female breast: Secondary | ICD-10-CM

## 2019-02-27 HISTORY — DX: Personal history of irradiation: Z92.3

## 2019-02-27 HISTORY — DX: Personal history of antineoplastic chemotherapy: Z92.21

## 2019-03-02 ENCOUNTER — Encounter: Payer: Self-pay | Admitting: *Deleted

## 2019-03-16 ENCOUNTER — Other Ambulatory Visit: Payer: Self-pay

## 2019-03-16 ENCOUNTER — Inpatient Hospital Stay: Payer: Managed Care, Other (non HMO) | Attending: Hematology and Oncology

## 2019-03-16 VITALS — BP 128/77 | HR 80 | Temp 97.8°F | Resp 17 | Ht 61.0 in | Wt 161.2 lb

## 2019-03-16 DIAGNOSIS — Z170 Estrogen receptor positive status [ER+]: Secondary | ICD-10-CM | POA: Insufficient documentation

## 2019-03-16 DIAGNOSIS — C50511 Malignant neoplasm of lower-outer quadrant of right female breast: Secondary | ICD-10-CM | POA: Diagnosis present

## 2019-03-16 DIAGNOSIS — Z5112 Encounter for antineoplastic immunotherapy: Secondary | ICD-10-CM | POA: Insufficient documentation

## 2019-03-16 MED ORDER — DIPHENHYDRAMINE HCL 25 MG PO CAPS
25.0000 mg | ORAL_CAPSULE | Freq: Once | ORAL | Status: AC
  Administered 2019-03-16: 25 mg via ORAL

## 2019-03-16 MED ORDER — ACETAMINOPHEN 325 MG PO TABS
ORAL_TABLET | ORAL | Status: AC
  Filled 2019-03-16: qty 2

## 2019-03-16 MED ORDER — ACETAMINOPHEN 325 MG PO TABS
650.0000 mg | ORAL_TABLET | Freq: Once | ORAL | Status: AC
  Administered 2019-03-16: 650 mg via ORAL

## 2019-03-16 MED ORDER — DIPHENHYDRAMINE HCL 25 MG PO CAPS
ORAL_CAPSULE | ORAL | Status: AC
  Filled 2019-03-16: qty 1

## 2019-03-16 MED ORDER — SODIUM CHLORIDE 0.9% FLUSH
10.0000 mL | INTRAVENOUS | Status: DC | PRN
  Administered 2019-03-16: 10 mL
  Filled 2019-03-16: qty 10

## 2019-03-16 MED ORDER — HEPARIN SOD (PORK) LOCK FLUSH 100 UNIT/ML IV SOLN
500.0000 [IU] | Freq: Once | INTRAVENOUS | Status: AC | PRN
  Administered 2019-03-16: 500 [IU]
  Filled 2019-03-16: qty 5

## 2019-03-16 MED ORDER — SODIUM CHLORIDE 0.9 % IV SOLN
Freq: Once | INTRAVENOUS | Status: AC
  Filled 2019-03-16: qty 250

## 2019-03-16 MED ORDER — TRASTUZUMAB-DKST CHEMO 150 MG IV SOLR
450.0000 mg | Freq: Once | INTRAVENOUS | Status: AC
  Administered 2019-03-16: 450 mg via INTRAVENOUS
  Filled 2019-03-16: qty 21.43

## 2019-03-16 NOTE — Patient Instructions (Signed)
Trastuzumab injection for infusion What is this medicine? TRASTUZUMAB (tras TOO zoo mab) is a monoclonal antibody. It is used to treat breast cancer and stomach cancer. This medicine may be used for other purposes; ask your health care provider or pharmacist if you have questions. COMMON BRAND NAME(S): Herceptin, Herzuma, KANJINTI, Ogivri, Ontruzant, Trazimera What should I tell my health care provider before I take this medicine? They need to know if you have any of these conditions:  heart disease  heart failure  lung or breathing disease, like asthma  an unusual or allergic reaction to trastuzumab, benzyl alcohol, or other medications, foods, dyes, or preservatives  pregnant or trying to get pregnant  breast-feeding How should I use this medicine? This drug is given as an infusion into a vein. It is administered in a hospital or clinic by a specially trained health care professional. Talk to your pediatrician regarding the use of this medicine in children. This medicine is not approved for use in children. Overdosage: If you think you have taken too much of this medicine contact a poison control center or emergency room at once. NOTE: This medicine is only for you. Do not share this medicine with others. What if I miss a dose? It is important not to miss a dose. Call your doctor or health care professional if you are unable to keep an appointment. What may interact with this medicine? This medicine may interact with the following medications:  certain types of chemotherapy, such as daunorubicin, doxorubicin, epirubicin, and idarubicin This list may not describe all possible interactions. Give your health care provider a list of all the medicines, herbs, non-prescription drugs, or dietary supplements you use. Also tell them if you smoke, drink alcohol, or use illegal drugs. Some items may interact with your medicine. What should I watch for while using this medicine? Visit your  doctor for checks on your progress. Report any side effects. Continue your course of treatment even though you feel ill unless your doctor tells you to stop. Call your doctor or health care professional for advice if you get a fever, chills or sore throat, or other symptoms of a cold or flu. Do not treat yourself. Try to avoid being around people who are sick. You may experience fever, chills and shaking during your first infusion. These effects are usually mild and can be treated with other medicines. Report any side effects during the infusion to your health care professional. Fever and chills usually do not happen with later infusions. Do not become pregnant while taking this medicine or for 7 months after stopping it. Women should inform their doctor if they wish to become pregnant or think they might be pregnant. Women of child-bearing potential will need to have a negative pregnancy test before starting this medicine. There is a potential for serious side effects to an unborn child. Talk to your health care professional or pharmacist for more information. Do not breast-feed an infant while taking this medicine or for 7 months after stopping it. Women must use effective birth control with this medicine. What side effects may I notice from receiving this medicine? Side effects that you should report to your doctor or health care professional as soon as possible:  allergic reactions like skin rash, itching or hives, swelling of the face, lips, or tongue  chest pain or palpitations  cough  dizziness  feeling faint or lightheaded, falls  fever  general ill feeling or flu-like symptoms  signs of worsening heart failure like   breathing problems; swelling in your legs and feet  unusually weak or tired Side effects that usually do not require medical attention (report to your doctor or health care professional if they continue or are bothersome):  bone pain  changes in  taste  diarrhea  joint pain  nausea/vomiting  weight loss This list may not describe all possible side effects. Call your doctor for medical advice about side effects. You may report side effects to FDA at 1-800-FDA-1088. Where should I keep my medicine? This drug is given in a hospital or clinic and will not be stored at home. NOTE: This sheet is a summary. It may not cover all possible information. If you have questions about this medicine, talk to your doctor, pharmacist, or health care provider.  2020 Elsevier/Gold Standard (2015-12-13 14:37:52)  

## 2019-03-31 NOTE — Progress Notes (Signed)
Pharmacist Chemotherapy Monitoring - Follow Up Assessment    I verify that I have reviewed each item in the below checklist:  . Regimen for the patient is scheduled for the appropriate day and plan matches scheduled date. [name] Appropriate non-routine labs are ordered dependent on drug ordered. . If applicable, additional medications reviewed and ordered per protocol based on lifetime cumulative doses and/or treatment regimen.   Plan for follow-up and/or issues identified: No . I-vent associated with next due treatment: No . MD and/or nursing notified: No    Kennith Center, Pharm.D., CPP 03/31/2019@11 :29 AM

## 2019-04-06 ENCOUNTER — Other Ambulatory Visit: Payer: Self-pay

## 2019-04-06 ENCOUNTER — Inpatient Hospital Stay: Payer: Managed Care, Other (non HMO) | Attending: Hematology and Oncology

## 2019-04-06 VITALS — BP 112/65 | HR 69 | Temp 98.4°F | Resp 18 | Wt 159.5 lb

## 2019-04-06 DIAGNOSIS — Z170 Estrogen receptor positive status [ER+]: Secondary | ICD-10-CM | POA: Insufficient documentation

## 2019-04-06 DIAGNOSIS — T451X5D Adverse effect of antineoplastic and immunosuppressive drugs, subsequent encounter: Secondary | ICD-10-CM | POA: Insufficient documentation

## 2019-04-06 DIAGNOSIS — Z79810 Long term (current) use of selective estrogen receptor modulators (SERMs): Secondary | ICD-10-CM | POA: Diagnosis not present

## 2019-04-06 DIAGNOSIS — G620 Drug-induced polyneuropathy: Secondary | ICD-10-CM | POA: Diagnosis not present

## 2019-04-06 DIAGNOSIS — Z5112 Encounter for antineoplastic immunotherapy: Secondary | ICD-10-CM | POA: Insufficient documentation

## 2019-04-06 DIAGNOSIS — Z923 Personal history of irradiation: Secondary | ICD-10-CM | POA: Diagnosis not present

## 2019-04-06 DIAGNOSIS — C50511 Malignant neoplasm of lower-outer quadrant of right female breast: Secondary | ICD-10-CM | POA: Diagnosis present

## 2019-04-06 MED ORDER — SODIUM CHLORIDE 0.9% FLUSH
10.0000 mL | INTRAVENOUS | Status: DC | PRN
  Administered 2019-04-06: 10 mL
  Filled 2019-04-06: qty 10

## 2019-04-06 MED ORDER — SODIUM CHLORIDE 0.9 % IV SOLN
Freq: Once | INTRAVENOUS | Status: AC
  Filled 2019-04-06: qty 250

## 2019-04-06 MED ORDER — HEPARIN SOD (PORK) LOCK FLUSH 100 UNIT/ML IV SOLN
500.0000 [IU] | Freq: Once | INTRAVENOUS | Status: AC | PRN
  Administered 2019-04-06: 500 [IU]
  Filled 2019-04-06: qty 5

## 2019-04-06 MED ORDER — DIPHENHYDRAMINE HCL 25 MG PO CAPS
ORAL_CAPSULE | ORAL | Status: AC
  Filled 2019-04-06: qty 1

## 2019-04-06 MED ORDER — ACETAMINOPHEN 325 MG PO TABS
ORAL_TABLET | ORAL | Status: AC
  Filled 2019-04-06: qty 2

## 2019-04-06 MED ORDER — DIPHENHYDRAMINE HCL 25 MG PO CAPS
25.0000 mg | ORAL_CAPSULE | Freq: Once | ORAL | Status: AC
  Administered 2019-04-06: 25 mg via ORAL

## 2019-04-06 MED ORDER — TRASTUZUMAB-DKST CHEMO 150 MG IV SOLR
450.0000 mg | Freq: Once | INTRAVENOUS | Status: AC
  Administered 2019-04-06: 450 mg via INTRAVENOUS
  Filled 2019-04-06: qty 21.43

## 2019-04-06 MED ORDER — ACETAMINOPHEN 325 MG PO TABS
650.0000 mg | ORAL_TABLET | Freq: Once | ORAL | Status: AC
  Administered 2019-04-06: 650 mg via ORAL

## 2019-04-06 NOTE — Patient Instructions (Signed)
[facility] Discharge Instructions for Patients Receiving Chemotherapy  Today you received the following chemotherapy agents Trastuzumab-dkst (OGIVRI).  To help prevent nausea and vomiting after your treatment, we encourage you to take your nausea medication as prescribed.   If you develop nausea and vomiting that is not controlled by your nausea medication, call the clinic.   BELOW ARE SYMPTOMS THAT SHOULD BE REPORTED IMMEDIATELY:  *FEVER GREATER THAN 100.5 F  *CHILLS WITH OR WITHOUT FEVER  NAUSEA AND VOMITING THAT IS NOT CONTROLLED WITH YOUR NAUSEA MEDICATION  *UNUSUAL SHORTNESS OF BREATH  *UNUSUAL BRUISING OR BLEEDING  TENDERNESS IN MOUTH AND THROAT WITH OR WITHOUT PRESENCE OF ULCERS  *URINARY PROBLEMS  *BOWEL PROBLEMS  UNUSUAL RASH Items with * indicate a potential emergency and should be followed up as soon as possible.  Feel free to call the clinic should you have any questions or concerns. The clinic phone number is [phone].  Please show the CHEMO ALERT CARD at check-in to the Emergency Department and triage nurse.   

## 2019-04-21 NOTE — Progress Notes (Signed)
Pharmacist Chemotherapy Monitoring - Follow Up Assessment    I verify that I have reviewed each item in the below checklist:  . Regimen for the patient is scheduled for the appropriate day and plan matches scheduled date. [name] Appropriate non-routine labs are ordered dependent on drug ordered. . If applicable, additional medications reviewed and ordered per protocol based on lifetime cumulative doses and/or treatment regimen.   Plan for follow-up and/or issues identified: No . I-vent associated with next due treatment: No . MD and/or nursing notified: No  Philomena Course 04/21/2019 3:07 PM

## 2019-04-24 ENCOUNTER — Other Ambulatory Visit: Payer: Self-pay

## 2019-04-24 DIAGNOSIS — C50511 Malignant neoplasm of lower-outer quadrant of right female breast: Secondary | ICD-10-CM

## 2019-04-26 NOTE — Progress Notes (Signed)
Patient Care Team: Kim Shan, MD as PCP - General (Family Medicine) Kim Kaufmann, RN as Oncology Nurse Navigator Kim Germany, RN as Oncology Nurse Navigator  DIAGNOSIS:    ICD-10-CM   1. Malignant neoplasm of lower-outer quadrant of right breast of female, estrogen receptor positive ([place])  C50.511    Z17.0     SUMMARY OF ONCOLOGIC HISTORY: Oncology History  Malignant neoplasm of lower-outer quadrant of right breast of female, estrogen receptor positive ([place])  03/06/2018 Initial Diagnosis   Screening mammogram detected right breast calcifications, 8 mm mass detected by ultrasound, axilla negative, biopsy revealed grade 2 IDC with high-grade DCIS, ER 100%, PR 1%, Ki-67 30%, HER-2 +3+ by IHC, T1BN0 stage Ia   03/19/2018 Cancer Staging   Staging form: Breast, AJCC 8th Edition - Clinical stage from 03/19/2018: Stage IA (cT1b, cN0, cM0, G2, ER+, PR+, HER2+) - Signed by Kim Lose, MD on 03/19/2018   03/24/2018 Surgery   Right lumpectomy Kim Montgomery): IDC with DCIS, grade 3, 0.6cm, ER+ (100%), PR+ (1%), HER2+ (3+), Ki67 30%, clear margins, 0.4 SLN negative for carcinoma.    05/14/2018 -  Chemotherapy   The patient had trastuzumab (HERCEPTIN) 300 mg in sodium chloride 0.9 % 250 mL chemo infusion, 273 mg, Intravenous,  Once, 4 of 4 cycles Dose modification: 6 mg/kg (original dose 2 mg/kg, Cycle 3, Reason: Other (see comments), Comment: switch to maintenance Herceptin - every 3 weeks), 6 mg/kg (original dose 6 mg/kg, Cycle 4, Reason: Other (see comments), Comment: bios not approved, herc again today as it is appr) Administration: 300 mg (05/14/2018), 150 mg (05/20/2018), 150 mg (06/10/2018), 147 mg (05/27/2018), 147 mg (06/03/2018), 150 mg (06/17/2018), 150 mg (06/24/2018), 150 mg (07/01/2018), 150 mg (07/08/2018), 150 mg (07/15/2018), 150 mg (07/22/2018), 450 mg (07/29/2018), 450 mg (08/18/2018) PACLitaxel (TAXOL) 138 mg in sodium chloride 0.9 % 250 mL chemo infusion (</= 14m/m2), 80 mg/m2 = 138 mg,  Intravenous,  Once, 3 of 3 cycles Dose modification: 65 mg/m2 (original dose 80 mg/m2, Cycle 2, Reason: Dose not tolerated), 50 mg/m2 (original dose 80 mg/m2, Cycle 3, Reason: Dose not tolerated) Administration: 138 mg (05/14/2018), 138 mg (05/20/2018), 114 mg (06/10/2018), 114 mg (05/27/2018), 114 mg (06/03/2018), 114 mg (06/17/2018), 114 mg (06/24/2018), 114 mg (07/01/2018), 114 mg (07/08/2018), 90 mg (07/15/2018), 90 mg (07/22/2018) trastuzumab-dkst (OGIVRI) 450 mg in sodium chloride 0.9 % 250 mL chemo infusion, 450 mg (100 % of original dose 450 mg), Intravenous,  Once, 12 of 13 cycles Dose modification: 450 mg (original dose 450 mg, Cycle 4, Reason: Other (see comments), Comment: Biosimilar Conversion) Administration: 450 mg (09/09/2018), 450 mg (09/29/2018), 450 mg (10/20/2018), 450 mg (11/10/2018), 450 mg (12/01/2018), 450 mg (12/22/2018), 450 mg (01/12/2019), 450 mg (02/02/2019), 450 mg (02/23/2019), 450 mg (03/16/2019), 450 mg (04/06/2019)  for chemotherapy treatment.    08/2018 -  Radiation Therapy   [facility]   09/2018 -  Anti-estrogen oral therapy   Tamoxifen 20 mg daily     CHIEF COMPLIANT: Follow-up of right breast cancer onHerceptin maintenance  INTERVAL HISTORY: Kim Kirlinis a 59y.o. with above-mentioned history of right breast cancer who underwent a lumpectomy, adjuvant chemotherapy, and radiation. She is currently on Herceptin maintenance. Mammogram on 02/27/19 showed no evidence of malignancy bilaterally. She presents to the clinic todayfor treatment.  She has tolerated the treatment extremely well without any problems or concerns.  ALLERGIES:  has No Known Allergies.  MEDICATIONS:  Current Outpatient  Medications  Medication Sig Dispense Refill  . albuterol (PROVENTIL HFA;VENTOLIN HFA) 108 (90 Base) MCG/ACT inhaler Inhale into the lungs every 6 (six) hours as needed for wheezing or shortness of breath.    [name] atenolol-chlorthalidone (TENORETIC)  100-25 MG tablet Take 1 tablet by mouth daily.    . cholecalciferol (VITAMIN D3) 25 MCG (1000 UNIT) tablet Take 2,000 Units by mouth daily.    . clonazePAM (KLONOPIN) 0.5 MG tablet Take 0.5 mg by mouth at bedtime.    . diclofenac Sodium (VOLTAREN) 1 % GEL Apply 1 % topically 2 (two) times daily.    [name] lidocaine-prilocaine (EMLA) cream Apply to affected area once 30 g 3  . Melatonin 10 MG TABS Take by mouth.    [name] omeprazole (PRILOSEC) 20 MG capsule Take 20 mg by mouth daily.    . potassium chloride SA (K-DUR,KLOR-CON) 20 MEQ tablet Take 20 mEq by mouth 2 (two) times daily.    . tamoxifen (NOLVADEX) 20 MG tablet Take 1 tablet (20 mg total) by mouth daily. 90 tablet 3   No current facility-administered medications for this visit.    PHYSICAL EXAMINATION: ECOG PERFORMANCE STATUS: 1 - Symptomatic but completely ambulatory  Vitals:   04/27/19 0910  BP: 131/77  Pulse: 64  Resp: 17  Temp: 98 F (36.7 C)  SpO2: 98%   Filed Weights   04/27/19 0910  Weight: 161 lb 14.4 oz (73.4 kg)    LABORATORY DATA:  I have reviewed the data as listed CMP Latest Ref Rng & Units 02/23/2019 01/12/2019 12/01/2018  Glucose 70 - 99 mg/dL 87 98 98  BUN 6 - 20 mg/dL '14 16 16  ' Creatinine 0.44 - 1.00 mg/dL 0.75 0.78 0.80  Sodium 135 - 145 mmol/L 142 138 140  Potassium 3.5 - 5.1 mmol/L 3.7 3.8 3.7  Chloride 98 - 111 mmol/L 105 104 103  CO2 22 - 32 mmol/L '27 28 26  ' Calcium 8.9 - 10.3 mg/dL 8.9 9.1 9.1  Total Protein 6.5 - 8.1 g/dL 6.9 6.8 7.0  Total Bilirubin 0.3 - 1.2 mg/dL 0.3 0.5 0.7  Alkaline Phos 38 - 126 U/L 78 73 79  AST 15 - 41 U/L 34 38 35  ALT 0 - 44 U/L 36 34 28    Lab Results  Component Value Date   WBC 5.5 04/27/2019   HGB 12.2 04/27/2019   HCT 36.4 04/27/2019   MCV 91.9 04/27/2019   PLT 149 (L) 04/27/2019   NEUTROABS 3.2 04/27/2019    ASSESSMENT & PLAN:  Malignant neoplasm of lower-outer quadrant of right breast of female, estrogen receptor positive ([place]) 03/06/2018:Screening  mammogram detected right breast calcifications, 8 mm mass detected by ultrasound, axilla negative, 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 Adj Chemo with Taxol-Herceptin weekly X 11 (stopped early for neuropathy)followed by Herceptin q 3 weeks for a year. 2. Adj RTcompleted September 2020(Dr. Wentworth) 3. Foll by Adj Anti estrogen therapywith tamoxifen started September 2020. ------------------------------------------------------------------------------------------------------------------------------------------- Current treatment: Herceptinmaintenance completed4/26/2021. Echocardiogram 11/17/2018: EF 60 to 65%   Herceptin toxicities: Being monitored closely Chemo-induced peripheral neuropathy:  Marked improvement Carpal tunnel syndrome   Breast cancer surveillance: Mammogram 02/27/2019: Benign breast density category C  Return to clinic in 3 months for survivorship care plan visit.  After that  we can see her once a year.    No orders of the defined types were placed in this encounter.  The patient has a good understanding of the overall plan. she agrees with it. she will call with any problems that may develop before the next visit here.  Total time spent: 30 mins including face to face time and time spent for planning, charting and coordination of care  Kim Lose, MD 04/27/2019  I, Cloyde Reams Dorshimer, am acting as scribe for Dr. Nicholas Montgomery.  I have reviewed the above documentation for accuracy and completeness, and I agree with the above.

## 2019-04-27 ENCOUNTER — Inpatient Hospital Stay: Payer: Managed Care, Other (non HMO)

## 2019-04-27 ENCOUNTER — Inpatient Hospital Stay (HOSPITAL_BASED_OUTPATIENT_CLINIC_OR_DEPARTMENT_OTHER): Payer: Managed Care, Other (non HMO) | Admitting: Hematology and Oncology

## 2019-04-27 ENCOUNTER — Encounter: Payer: Self-pay | Admitting: *Deleted

## 2019-04-27 ENCOUNTER — Other Ambulatory Visit: Payer: Self-pay

## 2019-04-27 VITALS — BP 131/77 | HR 64 | Temp 98.0°F | Resp 17 | Ht 61.0 in | Wt 161.9 lb

## 2019-04-27 DIAGNOSIS — Z95828 Presence of other vascular implants and grafts: Secondary | ICD-10-CM

## 2019-04-27 DIAGNOSIS — Z0000 Encounter for general adult medical examination without abnormal findings: Secondary | ICD-10-CM

## 2019-04-27 DIAGNOSIS — Z170 Estrogen receptor positive status [ER+]: Secondary | ICD-10-CM

## 2019-04-27 DIAGNOSIS — Z5112 Encounter for antineoplastic immunotherapy: Secondary | ICD-10-CM | POA: Diagnosis not present

## 2019-04-27 DIAGNOSIS — C50511 Malignant neoplasm of lower-outer quadrant of right female breast: Secondary | ICD-10-CM | POA: Diagnosis not present

## 2019-04-27 LAB — CBC WITH DIFFERENTIAL (CANCER CENTER ONLY)
Abs Immature Granulocytes: 0.01 10*3/uL (ref 0.00–0.07)
Basophils Absolute: 0 10*3/uL (ref 0.0–0.1)
Basophils Relative: 1 %
Eosinophils Absolute: 0.3 10*3/uL (ref 0.0–0.5)
Eosinophils Relative: 6 %
HCT: 36.4 % (ref 36.0–46.0)
Hemoglobin: 12.2 g/dL (ref 12.0–15.0)
Immature Granulocytes: 0 %
Lymphocytes Relative: 27 %
Lymphs Abs: 1.5 10*3/uL (ref 0.7–4.0)
MCH: 30.8 pg (ref 26.0–34.0)
MCHC: 33.5 g/dL (ref 30.0–36.0)
MCV: 91.9 fL (ref 80.0–100.0)
Monocytes Absolute: 0.5 10*3/uL (ref 0.1–1.0)
Monocytes Relative: 9 %
Neutro Abs: 3.2 10*3/uL (ref 1.7–7.7)
Neutrophils Relative %: 57 %
Platelet Count: 149 10*3/uL — ABNORMAL LOW (ref 150–400)
RBC: 3.96 MIL/uL (ref 3.87–5.11)
RDW: 13.3 % (ref 11.5–15.5)
WBC Count: 5.5 10*3/uL (ref 4.0–10.5)
nRBC: 0 % (ref 0.0–0.2)

## 2019-04-27 LAB — CMP (CANCER CENTER ONLY)
ALT: 29 U/L (ref 0–44)
AST: 35 U/L (ref 15–41)
Albumin: 3.5 g/dL (ref 3.5–5.0)
Alkaline Phosphatase: 76 U/L (ref 38–126)
Anion gap: 10 (ref 5–15)
BUN: 11 mg/dL (ref 6–20)
CO2: 27 mmol/L (ref 22–32)
Calcium: 9.4 mg/dL (ref 8.9–10.3)
Chloride: 105 mmol/L (ref 98–111)
Creatinine: 0.84 mg/dL (ref 0.44–1.00)
GFR, Est AFR Am: >60 mL/min (ref >60)
GFR, Estimated: >60 mL/min (ref >60)
Glucose, Bld: 99 mg/dL (ref 70–99)
Potassium: 3.9 mmol/L (ref 3.5–5.1)
Sodium: 142 mmol/L (ref 135–145)
Total Bilirubin: 0.6 mg/dL (ref 0.3–1.2)
Total Protein: 6.8 g/dL (ref 6.5–8.1)

## 2019-04-27 LAB — VITAMIN D 25 HYDROXY (VIT D DEFICIENCY, FRACTURES): Vit D, 25-Hydroxy: 44.53 ng/mL (ref 30–100)

## 2019-04-27 MED ORDER — SODIUM CHLORIDE 0.9 % IV SOLN
Freq: Once | INTRAVENOUS | Status: AC
  Filled 2019-04-27: qty 250

## 2019-04-27 MED ORDER — HEPARIN SOD (PORK) LOCK FLUSH 100 UNIT/ML IV SOLN
500.0000 [IU] | Freq: Once | INTRAVENOUS | Status: AC | PRN
  Administered 2019-04-27: 500 [IU]
  Filled 2019-04-27: qty 5

## 2019-04-27 MED ORDER — ACETAMINOPHEN 325 MG PO TABS
ORAL_TABLET | ORAL | Status: AC
  Filled 2019-04-27: qty 2

## 2019-04-27 MED ORDER — SODIUM CHLORIDE 0.9% FLUSH
10.0000 mL | INTRAVENOUS | Status: DC | PRN
  Administered 2019-04-27: 12:00:00 10 mL
  Filled 2019-04-27: qty 10

## 2019-04-27 MED ORDER — TRASTUZUMAB-DKST CHEMO 150 MG IV SOLR
450.0000 mg | Freq: Once | INTRAVENOUS | Status: AC
  Administered 2019-04-27: 11:00:00 450 mg via INTRAVENOUS
  Filled 2019-04-27: qty 21.43

## 2019-04-27 MED ORDER — DIPHENHYDRAMINE HCL 25 MG PO CAPS
ORAL_CAPSULE | ORAL | Status: AC
  Filled 2019-04-27: qty 1

## 2019-04-27 MED ORDER — DIPHENHYDRAMINE HCL 25 MG PO CAPS
25.0000 mg | ORAL_CAPSULE | Freq: Once | ORAL | Status: AC
  Administered 2019-04-27: 25 mg via ORAL

## 2019-04-27 MED ORDER — ACETAMINOPHEN 325 MG PO TABS
650.0000 mg | ORAL_TABLET | Freq: Once | ORAL | Status: AC
  Administered 2019-04-27: 11:00:00 650 mg via ORAL

## 2019-04-27 MED ORDER — SODIUM CHLORIDE 0.9% FLUSH
10.0000 mL | INTRAVENOUS | Status: DC | PRN
  Administered 2019-04-27: 10 mL
  Filled 2019-04-27: qty 10

## 2019-04-27 NOTE — Patient Instructions (Signed)
[facility] Discharge Instructions for Patients Receiving Chemotherapy  Today you received the following chemotherapy agents: trastuzumab-dkst (ogivri)  To help prevent nausea and vomiting after your treatment, we encourage you to take your nausea medication as prescribed.   If you develop nausea and vomiting that is not controlled by your nausea medication, call the clinic.   BELOW ARE SYMPTOMS THAT SHOULD BE REPORTED IMMEDIATELY:  *FEVER GREATER THAN 100.5 F  *CHILLS WITH OR WITHOUT FEVER  NAUSEA AND VOMITING THAT IS NOT CONTROLLED WITH YOUR NAUSEA MEDICATION  *UNUSUAL SHORTNESS OF BREATH  *UNUSUAL BRUISING OR BLEEDING  TENDERNESS IN MOUTH AND THROAT WITH OR WITHOUT PRESENCE OF ULCERS  *URINARY PROBLEMS  *BOWEL PROBLEMS  UNUSUAL RASH Items with * indicate a potential emergency and should be followed up as soon as possible.  Feel free to call the clinic should you have any questions or concerns. The clinic phone number is [phone].  Please show the [place] at check-in to the Emergency Department and triage nurse.

## 2019-04-27 NOTE — Assessment & Plan Note (Signed)
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 Adj Chemo with Taxol-Herceptin weekly X 11 (stopped early for neuropathy)followed by Herceptin q 3 weeks for a year. 2. Adj RTcompleted September 2020(Dr. Wentworth) 3. Foll by Adj Anti estrogen therapywith tamoxifen started September 2020. ------------------------------------------------------------------------------------------------------------------------------------------- Current treatment: Herceptinmaintenance completed4/26/2021. Echocardiogram 11/17/2018: EF 60 to 65%   Herceptin toxicities: Being monitored closely Chemo-induced peripheral neuropathy:  Marked improvement Carpal tunnel syndrome   Breast cancer surveillance: Mammogram 02/27/2019: Benign breast density category C  Return to clinic in 6 months for follow-up.  After that we can see her once a year.

## 2019-04-30 ENCOUNTER — Telehealth: Payer: Self-pay | Admitting: Hematology and Oncology

## 2019-04-30 NOTE — Telephone Encounter (Signed)
Scheduled per 04/06 los, patient has been called and voicemail was left. 

## 2019-07-29 NOTE — Progress Notes (Signed)
SURVIVORSHIP VISIT:    BRIEF ONCOLOGIC HISTORY:  Oncology History  Malignant neoplasm of lower-outer quadrant of right breast of female, estrogen receptor positive ([place])  03/06/2018 Initial Diagnosis   Screening mammogram detected right breast calcifications, 8 mm mass detected by ultrasound, axilla negative, biopsy revealed grade 2 IDC with high-grade DCIS, ER 100%, PR 1%, Ki-67 30%, HER-2 +3+ by IHC, T1BN0 stage Ia   03/19/2018 Cancer Staging   Staging form: Breast, AJCC 8th Edition - Clinical stage from 03/19/2018: Stage IA (cT1b, cN0, cM0, G2, ER+, PR+, HER2+)   03/24/2018 Surgery   Right lumpectomy [name]) [phone]): IDC with DCIS, grade 3, 0.6cm, ER+ (100%), PR+ (1%), HER2+ (3+), Ki67 30%, clear margins, 4 SLN negative for carcinoma.    03/24/2018 Cancer Staging   Staging form: Breast, AJCC 8th Edition - Pathologic stage from 03/24/2018: Stage IA (pT1b, pN0, cM0, G3, ER+, PR-, HER2+)   05/14/2018 - 04/27/2019 Chemotherapy   trastuzumab (HERCEPTIN) 300 mg in sodium chloride 0.9 % 250 mL chemo infusion, 273 mg, Intravenous,  Once, 4 of 4 cycles. Dose modification: 6 mg/kg (original dose 2 mg/kg, Cycle 3, Reason: Other (see comments), Comment: switch to maintenance Herceptin - every 3 weeks), 6 mg/kg (original dose 6 mg/kg, Cycle 4, Reason: Other (see comments), Comment: bios not approved, herc again today as it is appr). Administration: 300 mg (05/14/2018), 150 mg (05/20/2018), 150 mg (06/10/2018), 147 mg (05/27/2018), 147 mg (06/03/2018), 150 mg (06/17/2018), 150 mg (06/24/2018), 150 mg (07/01/2018), 150 mg (07/08/2018), 150 mg (07/15/2018), 150 mg (07/22/2018), 450 mg (07/29/2018), 450 mg (08/18/2018)  PACLitaxel (TAXOL) 138 mg in sodium chloride 0.9 % 250 mL chemo infusion (</= '80mg'$ /m2), 80 mg/m2 = 138 mg, Intravenous,  Once, 3 of 3 cycles. Dose modification: 65 mg/m2 (original dose 80 mg/m2, Cycle 2, Reason: Dose not tolerated), 50 mg/m2 (original dose 80 mg/m2, Cycle 3, Reason: Dose not tolerated)  Administration: 138 mg (05/14/2018), 138 mg (05/20/2018), 114 mg (06/10/2018), 114 mg (05/27/2018), 114 mg (06/03/2018), 114 mg (06/17/2018), 114 mg (06/24/2018), 114 mg (07/01/2018), 114 mg (07/08/2018), 90 mg (07/15/2018), 90 mg (07/22/2018)  trastuzumab-dkst (OGIVRI) 450 mg in sodium chloride 0.9 % 250 mL chemo infusion, 450 mg (100 % of original dose 450 mg), Intravenous,  Once, 13 of 13 cycles. Dose modification: 450 mg (original dose 450 mg, Cycle 4, Reason: Other (see comments), Comment: Biosimilar Conversion). Administration: 450 mg (09/09/2018), 450 mg (09/29/2018), 450 mg (10/20/2018), 450 mg (11/10/2018), 450 mg (12/01/2018), 450 mg (12/22/2018), 450 mg (01/12/2019), 450 mg (02/02/2019), 450 mg (02/23/2019), 450 mg (03/16/2019), 450 mg (04/06/2019), 450 mg (04/27/2019)   08/2018 -  Radiation Therapy   Adjuvant XRT at [facility] with Dr. Pablo Ledger    09/2018 - 09/2023 Anti-estrogen oral therapy   Tamoxifen     INTERVAL HISTORY:  Ms. Horsford to review her survivorship care plan detailing her treatment course for breast cancer, as well as monitoring long-term side effects of that treatment, education regarding health maintenance, screening, and overall wellness and health promotion.     Overall, Ms. Downum reports feeling quite well.  She is taking Tamoxifen daily and is having mild hot flashes, and some mood changes.  She has had lymphedema and has followed with PT for this.  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  This is tolerable and she is managing this well.  She also has carpal tunnel and wears a brace at night.    REVIEW OF SYSTEMS:  Review  of Systems  Constitutional: Negative for appetite change, chills, fatigue, fever and unexpected weight change.  HENT:   Negative for hearing loss and lump/mass.   Eyes: Negative for eye problems and icterus.  Respiratory: Negative for chest tightness, cough and shortness of breath.   Cardiovascular:  Negative for chest pain, leg swelling and palpitations.  Gastrointestinal: Negative for abdominal distention.  Endocrine: Positive for hot flashes (mild).  Genitourinary: Negative for difficulty urinating.   Musculoskeletal: Negative for arthralgias.  Skin: Negative for itching and rash.  Neurological: Positive for numbness. Negative for dizziness, extremity weakness and headaches.  Hematological: Negative for adenopathy. Does not bruise/bleed easily.  Psychiatric/Behavioral: Negative for depression. The patient is not nervous/anxious.   Breast: Denies any new nodularity, masses, tenderness, nipple changes, or nipple discharge.      ONCOLOGY TREATMENT TEAM:  1. Surgeon:  Dr. [name] at [facility] 2. Medical Oncologist: Dr. Lindi Adie  3. Radiation Oncologist: Dr. Pablo Ledger    PAST MEDICAL/SURGICAL HISTORY:  Past Medical History:  Diagnosis Date  . Cancer ([place]) 03/2018   right breast cancer  . GERD (gastroesophageal reflux disease)   . Hypertension   . Insomnia   . Personal history of chemotherapy   . Personal history of radiation therapy   . Seasonal allergies    Past Surgical History:  Procedure Laterality Date  . BREAST LUMPECTOMY Right 03/24/2018  . BREAST LUMPECTOMY WITH RADIOACTIVE SEED AND SENTINEL LYMPH NODE BIOPSY Right 03/24/2018   Procedure: RIGHT BREAST LUMPECTOMY WITH RADIOACTIVE SEED AND RIGHT AXILLARY SENTINEL LYMPH NODE BIOPSY;  Surgeon: Rolm Bookbinder, MD;  Location: [facility];  Service: General;  Laterality: Right;  . CYST REMOVAL NECK     at age 59  . FOOT SURGERY Right   . PORTACATH PLACEMENT N/A 05/13/2018   Procedure: INSERTION PORT-A-CATH WITH ULTRASOUND;  Surgeon: Rolm Bookbinder, MD;  Location: [facility];  Service: General;  Laterality: N/A;     ALLERGIES:  No Known Allergies   CURRENT MEDICATIONS:  Outpatient Encounter Medications as of 07/30/2019  Medication Sig  . albuterol (PROVENTIL  HFA;VENTOLIN HFA) 108 (90 Base) MCG/ACT inhaler Inhale into the lungs every 6 (six) hours as needed for wheezing or shortness of breath.  [name] atenolol-chlorthalidone (TENORETIC) 100-25 MG tablet Take 1 tablet by mouth daily.  . cholecalciferol (VITAMIN D3) 25 MCG (1000 UNIT) tablet Take 2,000 Units by mouth daily.  . clonazePAM (KLONOPIN) 0.5 MG tablet Take 0.5 mg by mouth at bedtime.  . diclofenac Sodium (VOLTAREN) 1 % GEL Apply 1 % topically 2 (two) times daily as needed.   . Melatonin 10 MG TABS Take by mouth.  [name] omeprazole (PRILOSEC) 20 MG capsule Take 20 mg by mouth daily as needed.   . potassium chloride SA (K-DUR,KLOR-CON) 20 MEQ tablet Take 20 mEq by mouth 2 (two) times daily.  . tamoxifen (NOLVADEX) 20 MG tablet Take 1 tablet (20 mg total) by mouth daily.  . [DISCONTINUED] lidocaine-prilocaine (EMLA) cream Apply to affected area once   No facility-administered encounter medications on file as of 07/30/2019.     ONCOLOGIC FAMILY HISTORY:  Family History  Problem Relation Age of Onset  . Breast cancer Sister        early to mid 54's     GENETIC COUNSELING/TESTING: Not at this time  SOCIAL HISTORY:  Social History   Socioeconomic History  . Marital status: Single    Spouse name: Not on file  . Number of children: Not on file  .  Years of education: Not on file  . Highest education level: Not on file  Occupational History  . Not on file  Tobacco Use  . Smoking status: Never Smoker  . Smokeless tobacco: Never Used  Vaping Use  . Vaping Use: Never used  Substance and Sexual Activity  . Alcohol use: Yes    Comment: social  . Drug use: Never  . Sexual activity: Not on file  Other Topics Concern  . Not on file  Social History Narrative  . Not on file   Social Determinants of Health   Financial Resource Strain:   . Difficulty of Paying Living Expenses:   Food Insecurity:   . Worried About Charity fundraiser in the Last Year:   . Arboriculturist in the Last Year:    Transportation Needs:   . Film/video editor (Medical):   [name] Lack of Transportation (Non-Medical):   Physical Activity:   . Days of Exercise per Week:   . Minutes of Exercise per Session:   Stress:   . Feeling of Stress :   Social Connections:   . Frequency of Communication with Friends and Family:   . Frequency of Social Gatherings with Friends and Family:   . Attends Religious Services:   . Active Member of Clubs or Organizations:   . Attends Archivist Meetings:   [name] Marital Status:   Intimate Partner Violence:   . Fear of Current or Ex-Partner:   . Emotionally Abused:   [name] Physically Abused:   . Sexually Abused:      OBSERVATIONS/OBJECTIVE:  BP (!) 132/62 (BP Location: Left Arm, Patient Position: Sitting)   Pulse 71   Temp 98 F (36.7 C) (Temporal)   Resp 18   Ht '5\' 1"'$  (1.549 m)   Wt 161 lb 12.8 oz (73.4 kg)   SpO2 97%   BMI 30.57 kg/m  GENERAL: Patient is a well appearing female in no acute distress HEENT:  Sclerae anicteric.  Oropharynx clear and moist. No ulcerations or evidence of oropharyngeal candidiasis. Neck is supple.  NODES:  No cervical, supraclavicular, or axillary lymphadenopathy palpated.  BREAST EXAM:  Right breast s/p lumpectomy and radiation, no sign of local recurrence, left breast benign LUNGS:  Clear to auscultation bilaterally.  No wheezes or rhonchi. HEART:  Regular rate and rhythm. No murmur appreciated. ABDOMEN:  Soft, nontender.  Positive, normoactive bowel sounds. No organomegaly palpated. MSK:  No focal spinal tenderness to palpation. Full range of motion bilaterally in the upper extremities. EXTREMITIES:  No peripheral edema.   SKIN:  Clear with no obvious rashes or skin changes. No nail dyscrasia. NEURO:  Nonfocal. Well oriented.  Appropriate affect.    LABORATORY DATA:  None for this visit.  DIAGNOSTIC IMAGING:  None for this visit.      ASSESSMENT AND PLAN:  Ms.. Kim Montgomery is a pleasant 59 y.o. female with Stage IA  right breast invasive ductal carcinoma, ER+/PR+/HER2+, diagnosed in 03/2018, treated with lumpectomy, adjuvant chemotherapy, maintenance herceptin x 1 year, adjuvant radiation therapy, and anti-estrogen therapy with Tamoxifen beginning in 09/2018.  She presents to the Survivorship Clinic for our initial meeting and routine follow-up post-completion of treatment for breast cancer.    1. Stage IA right breast cancer:  Ms. Hosman is continuing to recover from definitive treatment for breast cancer. She will follow-up with her medical oncologist, Dr. Lindi Adie in 6 months with history and physical exam per surveillance protocol.  She will continue her anti-estrogen therapy  with Tamoxifen. Thus far, she is tolerating the Tamoxifen well, with minimal side effects. She was instructed to make Dr. Lindi Adie or myself aware if she begins to experience any worsening side effects of the medication and I could see her back in clinic to help manage those side effects, as needed. Her mammogram is due 02/2020.   Today, a comprehensive survivorship care plan and treatment summary was reviewed with the patient today detailing her breast cancer diagnosis, treatment course, potential late/long-term effects of treatment, appropriate follow-up care with recommendations for the future, and patient education resources.  A copy of this summary, along with a letter will be sent to the patient's primary care provider via mail/fax/In Basket message after today's visit.    2. Mild distress: QOL-CSV given to patient.  This is very mild and likely related to her Tamoxifen.  I think as she continues on Tamoxifen it will improve.  If not, she will call us and let us know.  3. Bone health:   She was given education on specific activities to promote bone health.  4. Cancer screening:  Due to Ms. Treloar history and her age, she should receive screening for skin cancers, colon cancer, and gynecologic cancers.  The information and recommendations  are listed on the patient's comprehensive care plan/treatment summary and were reviewed in detail with the patient.    5. Health maintenance and wellness promotion: Ms. Paiva was encouraged to consume 5-7 servings of fruits and vegetables per day. We reviewed the "Nutrition Rainbow" handout, as well as the handout "Take Control of Your Health and Reduce Your Cancer Risk" from the [employer].  She was also encouraged to engage in moderate to vigorous exercise for 30 minutes per day most days of the week. We discussed the [employer] fitness program, which is designed for cancer survivors to help them become more physically fit after cancer treatments.  She was instructed to limit her alcohol consumption and continue to abstain from tobacco use.     6. Support services/counseling: It is not uncommon for this period of the patient's cancer care trajectory to be one of many emotions and stressors.  We discussed how this can be increasingly difficult during the times of quarantine and social distancing due to the COVID-19 pandemic.   She was given information regarding our available services and encouraged to contact me with any questions or for help enrolling in any of our support group/programs.   7. Lymphedema: This is stable and she is s/p PT and is improving.    8.  Chemotherapy induced peripheral neuropathy: Mild, hopefully this will continue to improve.    Follow up instructions:    -Return to cancer center in 6 months for f/u with Dr. Lindi Adie  -Mammogram due in 02/2020 -Follow up with Dr. [name] in 1 year -She is welcome to return back to the Survivorship Clinic at any time; no additional follow-up needed at this time.  -Consider referral back to survivorship as a long-term survivor for continued surveillance  The patient was provided an opportunity to ask questions and all were answered. The patient agreed with the plan and demonstrated an understanding of the instructions.    Total encounter time: 45 minutes*  Wilber Bihari, NP 07/30/19 10:57 AM Medical Oncology and Hematology [facility] [place], [place] [address] Tel. [phone]    Fax. [phone]  *Total Encounter Time as defined by the Centers for Medicare and Medicaid Services includes, in addition to  the face-to-face time of a patient visit (documented in the note above) non-face-to-face time: obtaining and reviewing outside history, ordering and reviewing medications, tests or procedures, care coordination (communications with other health care professionals or caregivers) and documentation in the medical record.

## 2019-07-30 ENCOUNTER — Inpatient Hospital Stay: Payer: Managed Care, Other (non HMO) | Attending: Adult Health | Admitting: Adult Health

## 2019-07-30 ENCOUNTER — Encounter: Payer: Self-pay | Admitting: Licensed Clinical Social Worker

## 2019-07-30 ENCOUNTER — Encounter: Payer: Self-pay | Admitting: Adult Health

## 2019-07-30 ENCOUNTER — Other Ambulatory Visit: Payer: Self-pay

## 2019-07-30 VITALS — BP 132/62 | HR 71 | Temp 98.0°F | Resp 18 | Ht 61.0 in | Wt 161.8 lb

## 2019-07-30 DIAGNOSIS — Z923 Personal history of irradiation: Secondary | ICD-10-CM | POA: Insufficient documentation

## 2019-07-30 DIAGNOSIS — G620 Drug-induced polyneuropathy: Secondary | ICD-10-CM | POA: Insufficient documentation

## 2019-07-30 DIAGNOSIS — Z79810 Long term (current) use of selective estrogen receptor modulators (SERMs): Secondary | ICD-10-CM | POA: Diagnosis not present

## 2019-07-30 DIAGNOSIS — Z170 Estrogen receptor positive status [ER+]: Secondary | ICD-10-CM | POA: Diagnosis not present

## 2019-07-30 DIAGNOSIS — C50511 Malignant neoplasm of lower-outer quadrant of right female breast: Secondary | ICD-10-CM | POA: Diagnosis not present

## 2019-07-30 DIAGNOSIS — T451X5D Adverse effect of antineoplastic and immunosuppressive drugs, subsequent encounter: Secondary | ICD-10-CM | POA: Insufficient documentation

## 2019-07-30 DIAGNOSIS — I890 Lymphedema, not elsewhere classified: Secondary | ICD-10-CM | POA: Insufficient documentation

## 2019-07-30 DIAGNOSIS — Z9221 Personal history of antineoplastic chemotherapy: Secondary | ICD-10-CM | POA: Diagnosis not present

## 2019-07-30 DIAGNOSIS — T451X5A Adverse effect of antineoplastic and immunosuppressive drugs, initial encounter: Secondary | ICD-10-CM

## 2019-07-30 NOTE — Progress Notes (Signed)
[facility] Quality of Life Screening Clinical Social Work  Clinical Social Work was referred by survivorship quality of life screening protocol.  The patient scored a 48.72 on the Quality of Life/ Cancer Survivor (QOL-CS) scale which indicates high quality of life.   Based on screener, there are no significant survivorship concerns. Patient was given general survivorship resources and may access support programs as needed.    Follow-up QOL screen to be sent in 1 month.     Mida Cory E, LCSW

## 2019-08-13 ENCOUNTER — Other Ambulatory Visit: Payer: Self-pay | Admitting: Obstetrics and Gynecology

## 2019-08-13 DIAGNOSIS — E2839 Other primary ovarian failure: Secondary | ICD-10-CM

## 2019-09-22 ENCOUNTER — Encounter: Payer: Self-pay | Admitting: Licensed Clinical Social Worker

## 2019-09-22 NOTE — Progress Notes (Signed)
[facility] Quality of Life Screening Clinical Social Work  Clinical Social Work received follow-up Quality of Life Screener (QOL-CS) back from patient.   Patient scored a 48.76 which indicates high quality of life. This is 0.04 higher than initial score indicating an unchanged, high quality of life.  Patient can continue to access support services, including support groups and resource referrals, as needed.     Reet Scharrer E, LCSW

## 2019-10-10 ENCOUNTER — Other Ambulatory Visit: Payer: Self-pay | Admitting: Hematology and Oncology

## 2019-10-21 ENCOUNTER — Ambulatory Visit (INDEPENDENT_AMBULATORY_CARE_PROVIDER_SITE_OTHER): Payer: Managed Care, Other (non HMO) | Admitting: Podiatry

## 2019-10-21 ENCOUNTER — Encounter: Payer: Self-pay | Admitting: Podiatry

## 2019-10-21 ENCOUNTER — Other Ambulatory Visit: Payer: Self-pay

## 2019-10-21 ENCOUNTER — Ambulatory Visit (INDEPENDENT_AMBULATORY_CARE_PROVIDER_SITE_OTHER): Payer: Managed Care, Other (non HMO)

## 2019-10-21 DIAGNOSIS — M21619 Bunion of unspecified foot: Secondary | ICD-10-CM

## 2019-10-21 DIAGNOSIS — M778 Other enthesopathies, not elsewhere classified: Secondary | ICD-10-CM

## 2019-10-21 DIAGNOSIS — M21622 Bunionette of left foot: Secondary | ICD-10-CM

## 2019-10-21 MED ORDER — NEOMYCIN-POLYMYXIN-HC 3.5-10000-1 OT SOLN
OTIC | 0 refills | Status: DC
Start: 2019-10-21 — End: 2020-01-26

## 2019-10-21 NOTE — Progress Notes (Signed)
Subjective:   Patient ID: Kim Montgomery, female   DOB: 59 y.o.   MRN: [id number]   HPI Patient presents stating that she has had history of bunion surgery right with hardware still in her foot and she has a bunion on her left foot that is getting more tender along with the outside of the foot and also an ingrown toenail on her left big toe.  She is going to [facility] in 2 weeks and would like to have something done after that and does not smoke likes to be active   Review of Systems  All other systems reviewed and are negative.       Objective:  Physical Exam Vitals and nursing note reviewed.  Constitutional:      Appearance: She is well-developed.  Pulmonary:     Effort: Pulmonary effort is normal.  Musculoskeletal:        General: Normal range of motion.  Skin:    General: Skin is warm.  Neurological:     Mental Status: She is alert.     Neurovascular status intact muscle strength adequate range of motion within normal limits with hyperostosis medial aspect first metatarsal head left is red and painful when pressed and also on the fifth metatarsal head left is red painful when pressed.  The medial border left hallux is incurvated sore in the corner with no active drainage noted and the right foot shows a previous surgical procedure that was done here approximately 15 years ago.  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  Previous bunion surgery right     Plan:  H&P all conditions and x-rays reviewed.  At this point I do think distal osteotomy left first and fifth metatarsal would be best and she wants this done but needs to wait till December and I did discuss ingrown toenail correction left and patient wants this done today and I allowed her to read and then signed consent form.  I infiltrated the left hallux 60 mg like Marcaine mixture I  remove the medial border after sterile prep and with sterile instrumentation and then exposed matrix applied phenol 3 applications 30 seconds followed by alcohol lavage sterile dressing and gave instructions for soaks.  Patient will be seen back for [place] to recheck for consultation concerning bunion and metatarsal osteotomy correction  X-rays indicate elevation of the intermetatarsal angle left and 4 5 intermetatarsal angle with well-healed surgical site right with pin in place and mild arthritis of the big toe joint right

## 2019-10-21 NOTE — Patient Instructions (Addendum)
Soak Instructions    THE DAY AFTER THE PROCEDURE  Place 1/4 cup of epsom salts in a quart of warm tap water.  Submerge your foot or feet with outer bandage intact for the initial soak; this will allow the bandage to become moist and wet for easy lift off.  Once you remove your bandage, continue to soak in the solution for 20 minutes.  This soak should be done twice a day.  Next, remove your foot or feet from solution, blot dry the affected area and cover.  You may use a band aid large enough to cover the area or use gauze and tape.  Apply other medications to the area as directed by the doctor such as polysporin neosporin.  IF YOUR SKIN BECOMES IRRITATED WHILE USING THESE INSTRUCTIONS, IT IS OKAY TO SWITCH TO  WHITE VINEGAR AND WATER. Or you may use antibacterial soap and water to keep the toe clean  Monitor for any signs/symptoms of infection. Call the office immediately if any occur or go directly to the emergency room. Call with any questions/concerns.    [facility] Instructions-Post Nail Surgery  You have had your ingrown toenail and root treated with a chemical.  This chemical causes a burn that will drain and ooze like a blister.  This can drain for 6-8 weeks or longer.  It is important to keep this area clean, covered, and follow the soaking instructions dispensed at the time of your surgery.  This area will eventually dry and form a scab.  Once the scab forms you no longer need to soak or apply a dressing.  If at any time you experience an increase in pain, redness, swelling, or drainage, you should contact the office as soon as possible.  Bunion  A bunion is a bump on the base of the big toe that forms when the bones of the big toe joint move out of position. Bunions may be small at first, but they often get larger over time. They can make walking painful. What are the causes? A bunion may be caused by:  Wearing narrow or pointed shoes that force the big toe to press against the  other toes.  Abnormal foot development that causes the foot to roll inward (pronate).  Changes in the foot that are caused by certain diseases, such as rheumatoid arthritis or polio.  A foot injury. What increases the risk? The following factors may make you more likely to develop this condition:  Wearing shoes that squeeze the toes together.  Having certain diseases, such as: ? Rheumatoid arthritis. ? Polio. ? Cerebral palsy.  Having family members who have bunions.  Being born with a foot deformity, such as flat feet or low arches.  Doing activities that put a lot of pressure on the feet, such as ballet dancing. What are the signs or symptoms? The main symptom of a bunion is a noticeable bump on the big toe. Other symptoms may include:  Pain.  Swelling around the big toe.  Redness and inflammation.  Thick or hardened skin on the big toe or between the toes.  Stiffness or loss of motion in the big toe.  Trouble with walking. How is this diagnosed? A bunion may be diagnosed based on your symptoms, medical history, and activities. You may have tests, such as:  X-rays. These allow your health care provider to check the position of the bones in your foot and look for damage to your joint. They also help your health  care provider determine the severity of your bunion and the best way to treat it.  Joint aspiration. In this test, a sample of fluid is removed from the toe joint. This test may be done if you are in a lot of pain. It helps rule out diseases that cause painful swelling of the joints, such as arthritis. How is this treated? Treatment depends on the severity of your symptoms. The goal of treatment is to relieve symptoms and prevent the bunion from getting worse. Your health care provider may recommend:  Wearing shoes that have a wide toe box.  Using bunion pads to cushion the affected area.  Taping your toes together to keep them in a normal position.  Placing  a device inside your shoe (orthotics) to help reduce pressure on your toe joint.  Taking medicine to ease pain, inflammation, and swelling.  Applying heat or ice to the affected area.  Doing stretching exercises.  Surgery to remove scar tissue and move the toes back into their normal position. This treatment is rare. Follow these instructions at home: Managing pain, stiffness, and swelling   If directed, put ice on the painful area: ? Put ice in a plastic bag. ? Place a towel between your skin and the bag. ? Leave the ice on for 20 minutes, 2-3 times a day. Activity   If directed, apply heat to the affected area before you exercise. Use the heat source that your health care provider recommends, such as a moist heat pack or a heating pad. ? Place a towel between your skin and the heat source. ? Leave the heat on for 20-30 minutes. ? Remove the heat if your skin turns bright red. This is especially important if you are unable to feel pain, heat, or cold. You may have a greater risk of getting burned.  Do exercises as told by your health care provider. General instructions  Support your toe joint with proper footwear, shoe padding, or taping as told by your health care provider.  Take over-the-counter and prescription medicines only as told by your health care provider.  Keep all follow-up visits as told by your health care provider. This is important. Contact a health care provider if your symptoms:  Get worse.  Do not improve in 2 weeks. Get help right away if you have:  Severe pain and trouble with walking. Summary  A bunion is a bump on the base of the big toe that forms when the bones of the big toe joint move out of position.  Bunions can make walking painful.  Treatment depends on the severity of your symptoms.  Support your toe joint with proper footwear, shoe padding, or taping as told by your health care provider. This information is not intended to replace  advice given to you by your health care provider. Make sure you discuss any questions you have with your health care provider. Document Revised: 06/24/2017 Document Reviewed: 04/30/2017 Elsevier Patient Education  [place].

## 2019-11-16 ENCOUNTER — Ambulatory Visit
Admission: RE | Admit: 2019-11-16 | Discharge: 2019-11-16 | Disposition: A | Discharge status: Home / Self Care | Payer: Managed Care, Other (non HMO) | Source: Ambulatory Visit | Attending: Obstetrics and Gynecology | Admitting: Obstetrics and Gynecology

## 2019-11-16 ENCOUNTER — Other Ambulatory Visit: Payer: Self-pay

## 2019-11-16 DIAGNOSIS — E2839 Other primary ovarian failure: Secondary | ICD-10-CM

## 2019-12-04 ENCOUNTER — Ambulatory Visit (INDEPENDENT_AMBULATORY_CARE_PROVIDER_SITE_OTHER): Payer: Managed Care, Other (non HMO) | Admitting: Podiatry

## 2019-12-04 ENCOUNTER — Other Ambulatory Visit: Payer: Self-pay

## 2019-12-04 ENCOUNTER — Encounter: Payer: Self-pay | Admitting: Podiatry

## 2019-12-04 DIAGNOSIS — M21622 Bunionette of left foot: Secondary | ICD-10-CM

## 2019-12-04 DIAGNOSIS — M21619 Bunion of unspecified foot: Secondary | ICD-10-CM

## 2019-12-07 ENCOUNTER — Telehealth: Payer: Self-pay | Admitting: Podiatry

## 2019-12-07 NOTE — Progress Notes (Signed)
Subjective:   Patient ID: Kim Montgomery, female   DOB: 59 y.o.   MRN: [id number]   HPI Patient presents to discuss surgical intervention and correction of her left foot stating its been sore and making it hard for her to wear shoe gear comfortably.  States that she has tried different treatments without relief of symptoms   ROS      Objective:  Physical Exam  Neurovascular status intact negative Bevelyn Buckles' sign noted patient is found to have structural bunion deformity and fifth metatarsal deformity with large bone spur formation redness and pain with palpation     Assessment:  HAV deformity tailor's bunion deformity     Plan:  Reviewed condition at great length and at this point allowed patient to read consent form going over alternative treatments complications associated with surgery.  She is willing to accept risk and after extensive review signed consent form is scheduled for outpatient surgery and is encouraged to call with questions and I reviewed with her the conservative therapy she has tried including wider shoe soaks and oral anti-inflammatories.  Patient had air fracture walker dispensed all instructions on usage and will be seen back for surgical intervention in the next several weeks

## 2019-12-07 NOTE — Telephone Encounter (Signed)
DOS: 12/22/2019  Procedures: [employer] [phone]) & Metatarsal Osteotomy 5th Lt [phone])  [employer] Effective From: 01/02/2019 -   Deductible: $1,500 with $1,500 met and $0 remaining. Out of Network: $6,000 with $6,000 met and $0 remaining. CoInsurance: 20% Copay: $  Per Sam B no Prior Authorization or Referrals are required. Call Reference# [id number]

## 2019-12-21 MED ORDER — OXYCODONE-ACETAMINOPHEN 10-325 MG PO TABS: 1.0000 | ORAL_TABLET | ORAL | 0 refills | Status: DC | PRN

## 2019-12-21 MED ORDER — ONDANSETRON HCL 4 MG PO TABS: 4.0000 mg | ORAL_TABLET | Freq: Three times a day (TID) | ORAL | 0 refills | Status: DC | PRN

## 2019-12-21 NOTE — Addendum Note (Signed)
Addended by: Wallene Huh on: 12/21/2019 01:26 PM   Modules accepted: Orders

## 2019-12-22 ENCOUNTER — Telehealth: Payer: Self-pay | Admitting: Hematology and Oncology

## 2019-12-22 ENCOUNTER — Encounter: Payer: Self-pay | Admitting: Podiatry

## 2019-12-22 DIAGNOSIS — M2010 Hallux valgus (acquired), unspecified foot: Secondary | ICD-10-CM

## 2019-12-22 DIAGNOSIS — M2012 Hallux valgus (acquired), left foot: Secondary | ICD-10-CM | POA: Diagnosis not present

## 2019-12-22 NOTE — Telephone Encounter (Signed)
Rescheduled appt due to provider PAL. Patient is aware of changes.

## 2019-12-23 NOTE — Progress Notes (Signed)
[place] Bunionectomy left foot, Metatarsal osteotomy 5th, left foot

## 2019-12-28 ENCOUNTER — Encounter: Payer: Self-pay | Admitting: Podiatry

## 2019-12-28 ENCOUNTER — Ambulatory Visit (INDEPENDENT_AMBULATORY_CARE_PROVIDER_SITE_OTHER): Payer: Managed Care, Other (non HMO)

## 2019-12-28 ENCOUNTER — Other Ambulatory Visit: Payer: Self-pay

## 2019-12-28 ENCOUNTER — Ambulatory Visit (INDEPENDENT_AMBULATORY_CARE_PROVIDER_SITE_OTHER): Payer: Managed Care, Other (non HMO) | Admitting: Podiatry

## 2019-12-28 DIAGNOSIS — M21619 Bunion of unspecified foot: Secondary | ICD-10-CM | POA: Diagnosis not present

## 2019-12-28 DIAGNOSIS — M21612 Bunion of left foot: Secondary | ICD-10-CM | POA: Diagnosis not present

## 2019-12-30 NOTE — Progress Notes (Signed)
Subjective:   Patient ID: Kim Montgomery, female   DOB: 59 y.o.   MRN: [id number]   HPI Patient states she is doing very well with surgery and very pleased   ROS      Objective:  Physical Exam  Neurovascular status intact negative Denna Haggard' sign noted wound edges well coapted hallux in rectus position incision sites healing well no drainage noted     Assessment:  Doing well post osteotomy first fifth metatarsal left     Plan:  H&P reviewed condition and recommended continued compression elevation and immobilization and reappoint to recheck  X-rays indicate osteotomies are healing well fixation in place good correction

## 2020-01-18 ENCOUNTER — Encounter: Payer: Managed Care, Other (non HMO) | Admitting: Podiatry

## 2020-01-20 ENCOUNTER — Ambulatory Visit (INDEPENDENT_AMBULATORY_CARE_PROVIDER_SITE_OTHER): Payer: Managed Care, Other (non HMO)

## 2020-01-20 ENCOUNTER — Encounter: Payer: Self-pay | Admitting: Podiatry

## 2020-01-20 ENCOUNTER — Ambulatory Visit (INDEPENDENT_AMBULATORY_CARE_PROVIDER_SITE_OTHER): Payer: Managed Care, Other (non HMO) | Admitting: Podiatry

## 2020-01-20 ENCOUNTER — Other Ambulatory Visit: Payer: Self-pay

## 2020-01-20 DIAGNOSIS — M21622 Bunionette of left foot: Secondary | ICD-10-CM

## 2020-01-20 NOTE — Progress Notes (Signed)
Subjective:   Patient ID: Kim Montgomery, female   DOB: 60 y.o.   MRN: [id number]   HPI Patient states she is doing well very pleased with how her foot is progressing   ROS      Objective:  Physical Exam  Neurovascular status intact negative Bevelyn Buckles' sign noted wound edges well coapted left first and fifth metatarsals with good alignment noted good range of motion no crepitus of the joint     Assessment:  Overall doing well post osteotomy left first fifth metatarsal     Plan:  Reviewed x-ray and at this point dispense ankle compression stocking may return to gradual soft shoes over the next several weeks and gradual increase in activities with continued range of motion  X-rays indicate osteotomies are healing well with slight healing still to go more in the fifth metatarsal but it should heal uneventfully over time and encourage patient to call questions concerns

## 2020-01-25 NOTE — Progress Notes (Signed)
Patient Care Team: Kathreen Devoid, PA-C as PCP - General (Internal Medicine) Nicholas Lose, MD as Consulting Physician (Hematology and Oncology) Rolm Bookbinder, MD as Consulting Physician (General Surgery)  DIAGNOSIS:    ICD-10-CM   1. Malignant neoplasm of lower-outer quadrant of right breast of female, estrogen receptor positive ([place])  C50.511    Z17.0     SUMMARY OF ONCOLOGIC HISTORY: Oncology History  Malignant neoplasm of lower-outer quadrant of right breast of female, estrogen receptor positive ([place])  03/06/2018 Initial Diagnosis   Screening mammogram detected right breast calcifications, 8 mm mass detected by ultrasound, axilla negative, biopsy revealed grade 2 IDC with high-grade DCIS, ER 100%, PR 1%, Ki-67 30%, HER-2 +3+ by IHC, T1BN0 stage Ia   03/19/2018 Cancer Staging   Staging form: Breast, AJCC 8th Edition - Clinical stage from 03/19/2018: Stage IA (cT1b, cN0, cM0, G2, ER+, PR+, HER2+)   03/24/2018 Surgery   Right lumpectomy [name]) [id number]): IDC with DCIS, grade 3, 0.6cm, ER+ (100%), PR+ (1%), HER2+ (3+), Ki67 30%, clear margins, 4 SLN negative for carcinoma.    03/24/2018 Cancer Staging   Staging form: Breast, AJCC 8th Edition - Pathologic stage from 03/24/2018: Stage IA (pT1b, pN0, cM0, G3, ER+, PR-, HER2+)   05/14/2018 - 04/27/2019 Chemotherapy   trastuzumab (HERCEPTIN) 300 mg in sodium chloride 0.9 % 250 mL chemo infusion, 273 mg, Intravenous,  Once, 4 of 4 cycles. Dose modification: 6 mg/kg (original dose 2 mg/kg, Cycle 3, Reason: Other (see comments), Comment: switch to maintenance Herceptin - every 3 weeks), 6 mg/kg (original dose 6 mg/kg, Cycle 4, Reason: Other (see comments), Comment: bios not approved, herc again today as it is appr). Administration: 300 mg (05/14/2018), 150 mg (05/20/2018), 150 mg (06/10/2018), 147 mg (05/27/2018), 147 mg (06/03/2018), 150 mg (06/17/2018), 150 mg (06/24/2018), 150 mg (07/01/2018), 150 mg (07/08/2018), 150 mg (07/15/2018),  150 mg (07/22/2018), 450 mg (07/29/2018), 450 mg (08/18/2018)  PACLitaxel (TAXOL) 138 mg in sodium chloride 0.9 % 250 mL chemo infusion (</= 90m/m2), 80 mg/m2 = 138 mg, Intravenous,  Once, 3 of 3 cycles. Dose modification: 65 mg/m2 (original dose 80 mg/m2, Cycle 2, Reason: Dose not tolerated), 50 mg/m2 (original dose 80 mg/m2, Cycle 3, Reason: Dose not tolerated) Administration: 138 mg (05/14/2018), 138 mg (05/20/2018), 114 mg (06/10/2018), 114 mg (05/27/2018), 114 mg (06/03/2018), 114 mg (06/17/2018), 114 mg (06/24/2018), 114 mg (07/01/2018), 114 mg (07/08/2018), 90 mg (07/15/2018), 90 mg (07/22/2018)  trastuzumab-dkst (OGIVRI) 450 mg in sodium chloride 0.9 % 250 mL chemo infusion, 450 mg (100 % of original dose 450 mg), Intravenous,  Once, 13 of 13 cycles. Dose modification: 450 mg (original dose 450 mg, Cycle 4, Reason: Other (see comments), Comment: Biosimilar Conversion). Administration: 450 mg (09/09/2018), 450 mg (09/29/2018), 450 mg (10/20/2018), 450 mg (11/10/2018), 450 mg (12/01/2018), 450 mg (12/22/2018), 450 mg (01/12/2019), 450 mg (02/02/2019), 450 mg (02/23/2019), 450 mg (03/16/2019), 450 mg (04/06/2019), 450 mg (04/27/2019)   08/2018 -  Radiation Therapy   [facility]   09/2018 - 09/2023 Anti-estrogen oral therapy   Tamoxifen     CHIEF COMPLIANT: Follow-up of right breast cancer ontamoxifen  INTERVAL HISTORY: Kim Kluesneris a 60y.o. with above-mentioned history of right breast cancer who underwent a lumpectomy, adjuvant chemotherapy, radiation,  Herceptin maintenance, and is currently on antiestrogen therapy with tamoxifen. She presents to the clinic todayfor follow-up.   She is tolerating tamoxifen fairly well without any problems or concerns.  ALLERGIES:  has No Known Allergies. ° °MEDICATIONS:  °Current Outpatient Medications  °Medication Sig Dispense Refill  °• albuterol (PROVENTIL HFA;VENTOLIN HFA) 108 (90 Base) MCG/ACT inhaler Inhale into the lungs every 6  (six) hours as needed for wheezing or shortness of breath.    °• atenolol-chlorthalidone (TENORETIC) 100-25 MG tablet Take 1 tablet by mouth daily.    °• cholecalciferol (VITAMIN D3) 25 MCG (1000 UNIT) tablet Take 2,000 Units by mouth daily.    °• clonazePAM (KLONOPIN) 0.5 MG tablet Take 0.5 mg by mouth at bedtime.    °• diclofenac Sodium (VOLTAREN) 1 % GEL Apply 1 % topically 2 (two) times daily as needed.     °• Melatonin 10 MG TABS Take by mouth.    °• omeprazole (PRILOSEC) 20 MG capsule Take 20 mg by mouth daily as needed.     °• potassium chloride SA (K-DUR,KLOR-CON) 20 MEQ tablet Take 20 mEq by mouth 2 (two) times daily.    °• tamoxifen (NOLVADEX) 20 MG tablet TAKE 1 TABLET BY MOUTH EVERY DAY 90 tablet 3  ° °No current facility-administered medications for this visit.  ° ° °PHYSICAL EXAMINATION: °ECOG PERFORMANCE STATUS: 1 - Symptomatic but completely ambulatory ° °Vitals:  ° 01/26/20 1533  °BP: 134/68  °Pulse: 84  °Resp: 17  °Temp: 97.7 °F (36.5 °C)  °SpO2: 98%  ° °Filed Weights  ° 01/26/20 1533  °Weight: 158 lb 14.4 oz (72.1 kg)  ° ° °BREAST: No palpable masses or nodules in either right or left breasts. No palpable axillary supraclavicular or infraclavicular adenopathy no breast tenderness or nipple discharge. (exam performed in the presence of a chaperone) ° °LABORATORY DATA:  °I have reviewed the data as listed °CMP Latest Ref Rng & Units 04/27/2019 02/23/2019 01/12/2019  °Glucose 70 - 99 mg/dL 99 87 98  °BUN 6 - 20 mg/dL 11 14 16  °Creatinine 0.44 - 1.00 mg/dL 0.84 0.75 0.78  °Sodium 135 - 145 mmol/L 142 142 138  °Potassium 3.5 - 5.1 mmol/L 3.9 3.7 3.8  °Chloride 98 - 111 mmol/L 105 105 104  °CO2 22 - 32 mmol/L 27 27 28  °Calcium 8.9 - 10.3 mg/dL 9.4 8.9 9.1  °Total Protein 6.5 - 8.1 g/dL 6.8 6.9 6.8  °Total Bilirubin 0.3 - 1.2 mg/dL 0.6 0.3 0.5  °Alkaline Phos 38 - 126 U/L 76 78 73  °AST 15 - 41 U/L 35 34 38  °ALT 0 - 44 U/L 29 36 34  ° ° °Lab Results  °Component Value Date  ° WBC 5.5 04/27/2019  ° HGB  12.2 04/27/2019  ° HCT 36.4 04/27/2019  ° MCV 91.9 04/27/2019  ° PLT 149 (L) 04/27/2019  ° NEUTROABS 3.2 04/27/2019  ° ° °ASSESSMENT & PLAN:  °Malignant neoplasm of lower-outer quadrant of right breast of female, estrogen receptor positive ([facility]) °03/06/2018:Screening mammogram detected right breast calcifications, 8 mm mass detected by ultrasound, axilla negative, biopsy revealed grade 2 IDC with high-grade DCIS, ER 100%, PR 1%, Ki-67 30%, HER-2 +3+ by IHC, T1BN0 stage Ia °  °3.23.20: Rt Lumpectomy Grade 2 IDC 0.6 cm, 0/4 LN Neg, Er 100%, PR 1%, Her 2: 3+ pos, T1bN0 Stage 1A °  °Treatment plan: °1. Adj Chemo with Taxol-Herceptin weekly X 11 (stopped early for neuropathy) followed by Herceptin q 3 weeks for a year.  Completed 04/27/2019 °2. Adj RT completed September 2020 (Dr. Wentworth) °3. Foll by Adj Anti estrogen therapy with tamoxifen started September 2020. °------------------------------------------------------------------------------------------------------------------------------------------- °Current treatment: Tamoxifen started September 2020. °Echocardiogram 11/17/2018: EF 60 to 65%  °  °Chemo-induced peripheral   neuropathy:   Marked improvement °Carpal tunnel syndrome °   °Tamoxifen toxicities: Denies any major adverse effects. °Occasional hot flashes ° °Breast cancer surveillance:  °Mammogram 02/27/2019: Benign breast density category C °Breast exam 01/26/2020: Benign ° °Return to clinic in 1 year for follow-up ° ° ° °No orders of the defined types were placed in this encounter. ° °The patient has a good understanding of the overall plan. she agrees with it. she will call with any problems that may develop before the next visit here. ° °Total time spent: 20 mins including face to face time and time spent for planning, charting and coordination of care ° °Gudena, Vinay, MD °01/26/2020 ° °I, Molly Dorshimer, am acting as scribe for Dr. Vinay Gudena. ° °I have reviewed the above documentation for accuracy and  completeness, and I agree with the above. ° ° ° ° ° ° °

## 2020-01-26 ENCOUNTER — Inpatient Hospital Stay: Payer: Managed Care, Other (non HMO) | Attending: Hematology and Oncology | Admitting: Hematology and Oncology

## 2020-01-26 ENCOUNTER — Other Ambulatory Visit: Payer: Self-pay | Admitting: Hematology and Oncology

## 2020-01-26 ENCOUNTER — Other Ambulatory Visit: Payer: Self-pay

## 2020-01-26 DIAGNOSIS — C50511 Malignant neoplasm of lower-outer quadrant of right female breast: Secondary | ICD-10-CM | POA: Insufficient documentation

## 2020-01-26 DIAGNOSIS — Z79899 Other long term (current) drug therapy: Secondary | ICD-10-CM | POA: Insufficient documentation

## 2020-01-26 DIAGNOSIS — R232 Flushing: Secondary | ICD-10-CM | POA: Insufficient documentation

## 2020-01-26 DIAGNOSIS — Z9889 Other specified postprocedural states: Secondary | ICD-10-CM

## 2020-01-26 DIAGNOSIS — G5600 Carpal tunnel syndrome, unspecified upper limb: Secondary | ICD-10-CM | POA: Insufficient documentation

## 2020-01-26 DIAGNOSIS — Z170 Estrogen receptor positive status [ER+]: Secondary | ICD-10-CM | POA: Insufficient documentation

## 2020-01-26 DIAGNOSIS — G620 Drug-induced polyneuropathy: Secondary | ICD-10-CM | POA: Insufficient documentation

## 2020-01-26 DIAGNOSIS — T451X5A Adverse effect of antineoplastic and immunosuppressive drugs, initial encounter: Secondary | ICD-10-CM | POA: Diagnosis not present

## 2020-01-26 NOTE — Assessment & Plan Note (Addendum)
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 Adj Chemo with Taxol-Herceptin weekly X 11 (stopped early for neuropathy)followed by Herceptin q 3 weeks for a year.  Completed 04/27/2019 2. Adj RTcompleted September 2020(Dr. Wentworth) 3. Foll by Adj Anti estrogen therapywith tamoxifen started September 2020. ------------------------------------------------------------------------------------------------------------------------------------------- Current treatment: Tamoxifen started September 2020. Echocardiogram 11/17/2018: EF 60 to 65%   Chemo-induced peripheral neuropathy: Marked improvement Carpal tunnel syndrome   Tamoxifen toxicities: Denies any major adverse effects Occasional hot flashes  Breast cancer surveillance:  Mammogram 02/27/2019: Benign breast density category C Breast exam 01/26/2020: Benign  Return to clinic in 1 year for follow-up

## 2020-01-27 ENCOUNTER — Telehealth: Payer: Self-pay | Admitting: Hematology and Oncology

## 2020-01-27 NOTE — Telephone Encounter (Signed)
Scheduled appts per 1/25 los. Pt confirmed appt date and time.

## 2020-02-01 ENCOUNTER — Ambulatory Visit: Payer: Managed Care, Other (non HMO) | Admitting: Hematology and Oncology

## 2020-03-02 ENCOUNTER — Other Ambulatory Visit: Payer: Self-pay

## 2020-03-02 ENCOUNTER — Ambulatory Visit (INDEPENDENT_AMBULATORY_CARE_PROVIDER_SITE_OTHER): Payer: Managed Care, Other (non HMO)

## 2020-03-02 ENCOUNTER — Ambulatory Visit: Payer: Managed Care, Other (non HMO) | Admitting: Podiatry

## 2020-03-02 ENCOUNTER — Encounter: Payer: Managed Care, Other (non HMO) | Admitting: Podiatry

## 2020-03-02 ENCOUNTER — Ambulatory Visit: Payer: Managed Care, Other (non HMO)

## 2020-03-02 DIAGNOSIS — M21622 Bunionette of left foot: Secondary | ICD-10-CM

## 2020-03-02 DIAGNOSIS — M2012 Hallux valgus (acquired), left foot: Secondary | ICD-10-CM

## 2020-03-02 DIAGNOSIS — Z9889 Other specified postprocedural states: Secondary | ICD-10-CM

## 2020-03-07 NOTE — Progress Notes (Signed)
Subjective:   Patient ID: Kim Montgomery, female   DOB: 60 y.o.   MRN: [id number]   HPI Patient states I am doing well but I still get swelling at the end of the day   ROS      Objective:  Physical Exam  Neurovascular status intact with patient's left foot healing well wound edges well coapted hallux in rectus position good range of motion mild swelling still noted      Assessment:  Doing well post foot surgery left     Plan:  H&P reviewed condition recommended the continued elevation compression and patient will be seen back to recheck as needed and is encouraged to return to normal activity  X-rays indicate osteotomies are healing well fixation in place no signs of pathology

## 2020-03-14 ENCOUNTER — Ambulatory Visit
Admission: RE | Admit: 2020-03-14 | Discharge: 2020-03-14 | Disposition: A | Discharge status: Home / Self Care | Payer: Managed Care, Other (non HMO) | Source: Ambulatory Visit | Attending: Hematology and Oncology | Admitting: Hematology and Oncology

## 2020-03-14 ENCOUNTER — Other Ambulatory Visit: Payer: Self-pay

## 2020-03-14 ENCOUNTER — Encounter: Payer: Self-pay | Admitting: Podiatry

## 2020-03-14 DIAGNOSIS — Z9889 Other specified postprocedural states: Secondary | ICD-10-CM

## 2020-04-30 IMAGING — MG DIGITAL DIAGNOSTIC BILAT W/ TOMO W/ CAD
6 of 9 series · 6 of 25 positions shown · non-contrast
Comparison: Previous exam(s).

CLINICAL DATA: History of right breast cancer status post
lumpectomy in Friday March, 2018.

EXAM:
DIGITAL DIAGNOSTIC BILATERAL MAMMOGRAM WITH CAD AND TOMO

[R MLO]
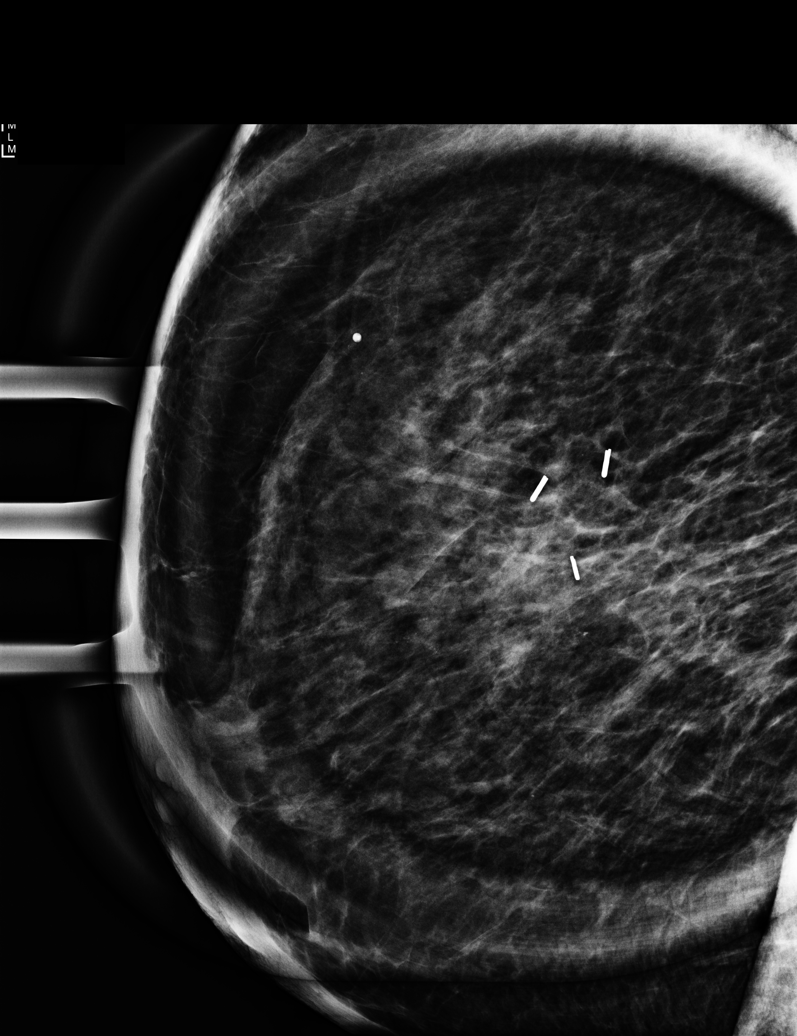

[R CC synth-2D]
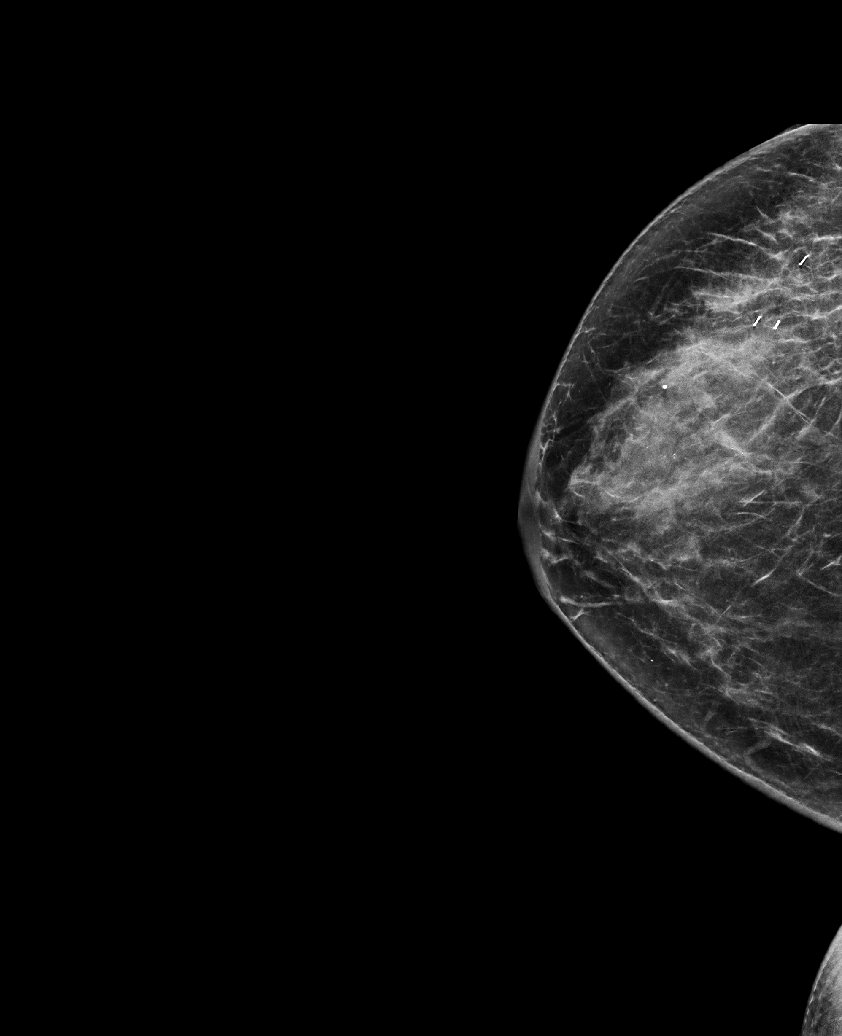

[R MLO synth-2D]
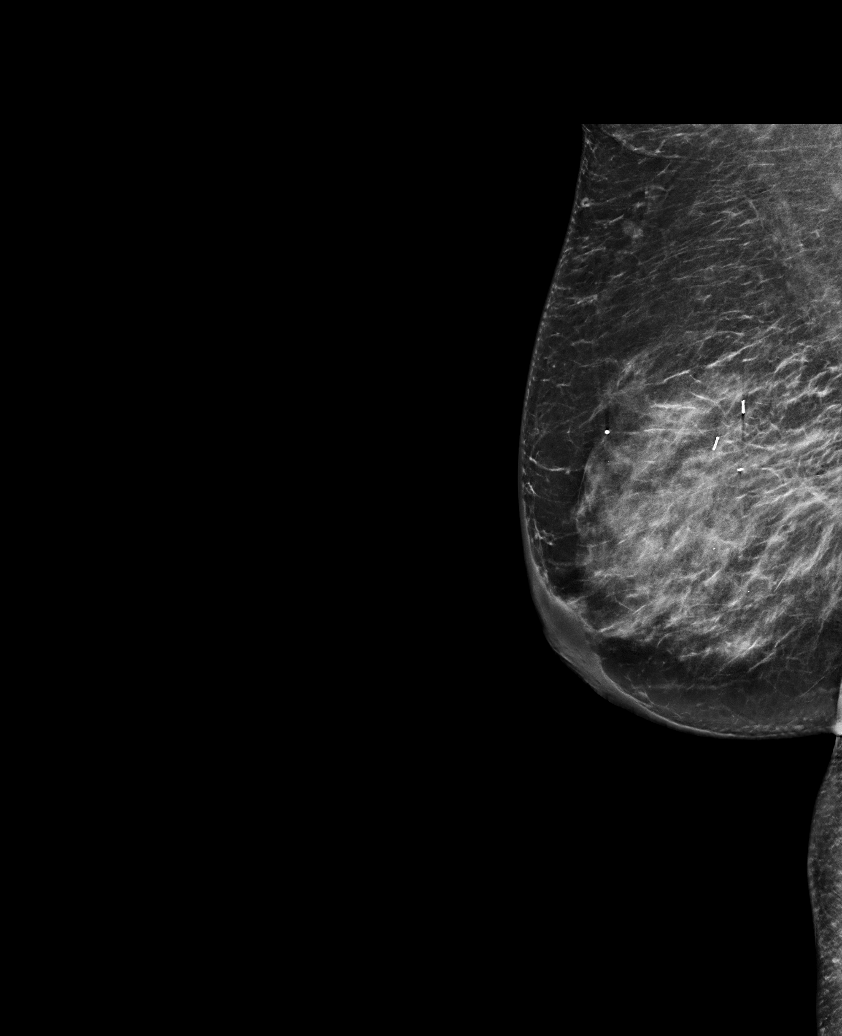

[L CC synth-2D]
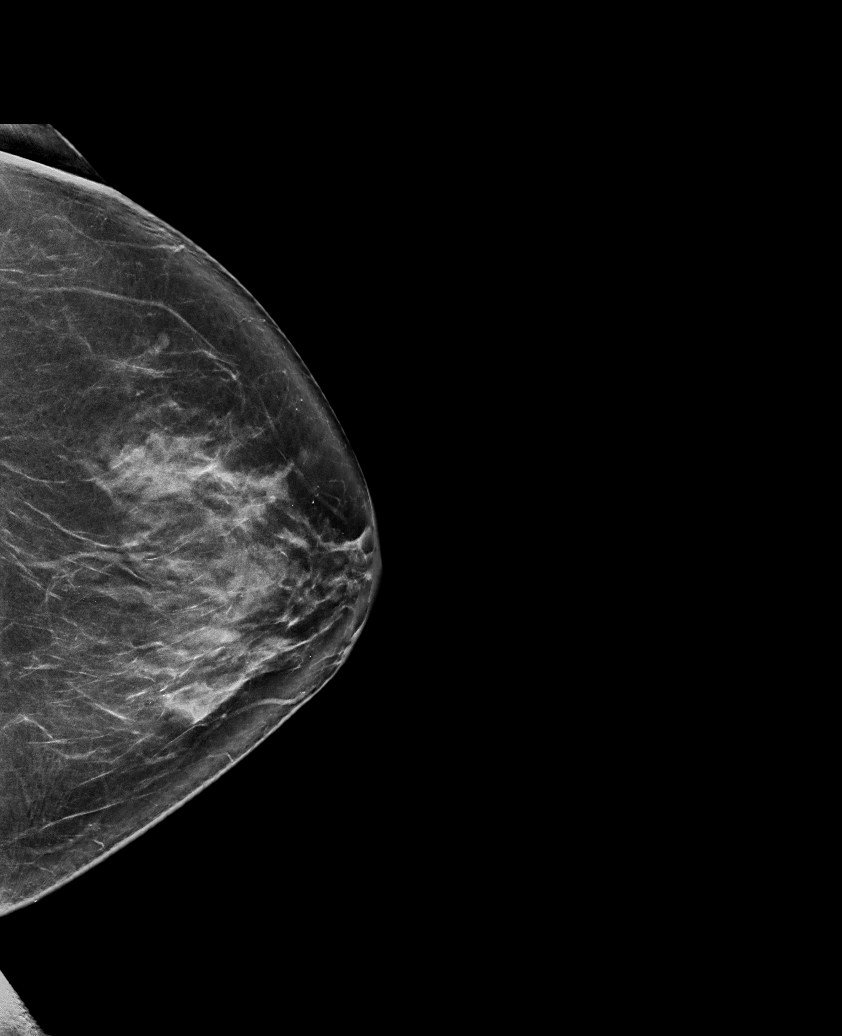

[L MLO synth-2D]
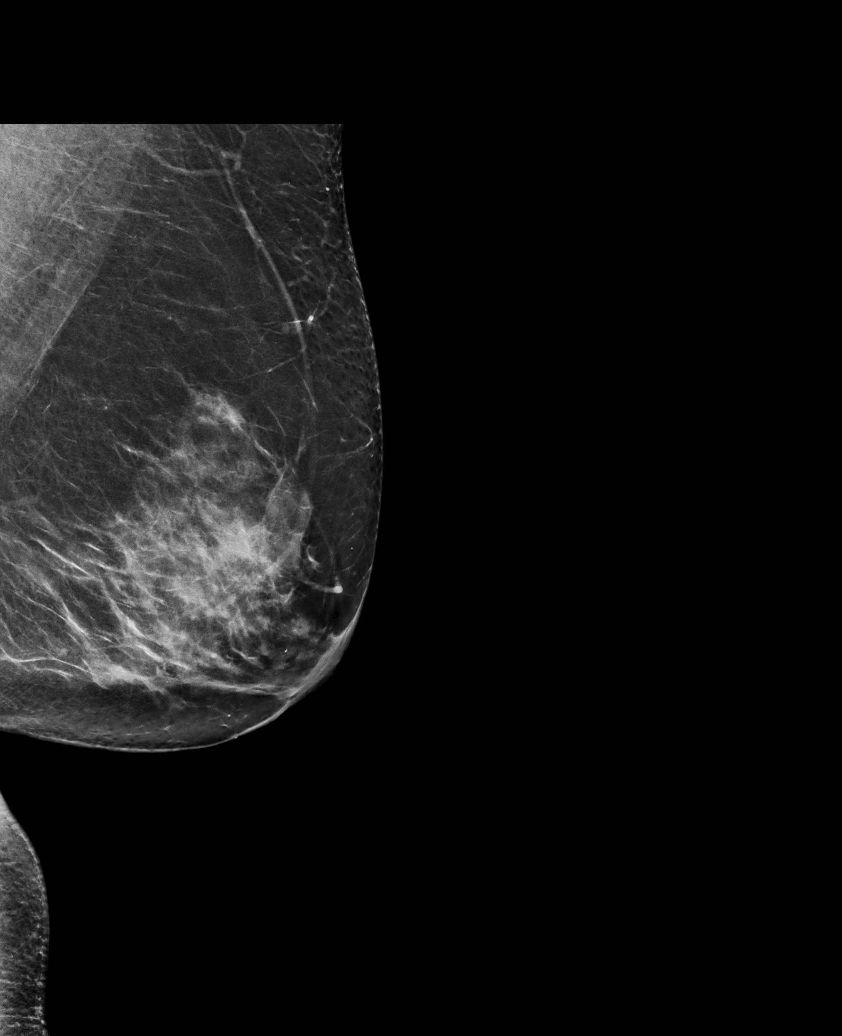

[R MLO tomo · tomo slice 39/76.0]
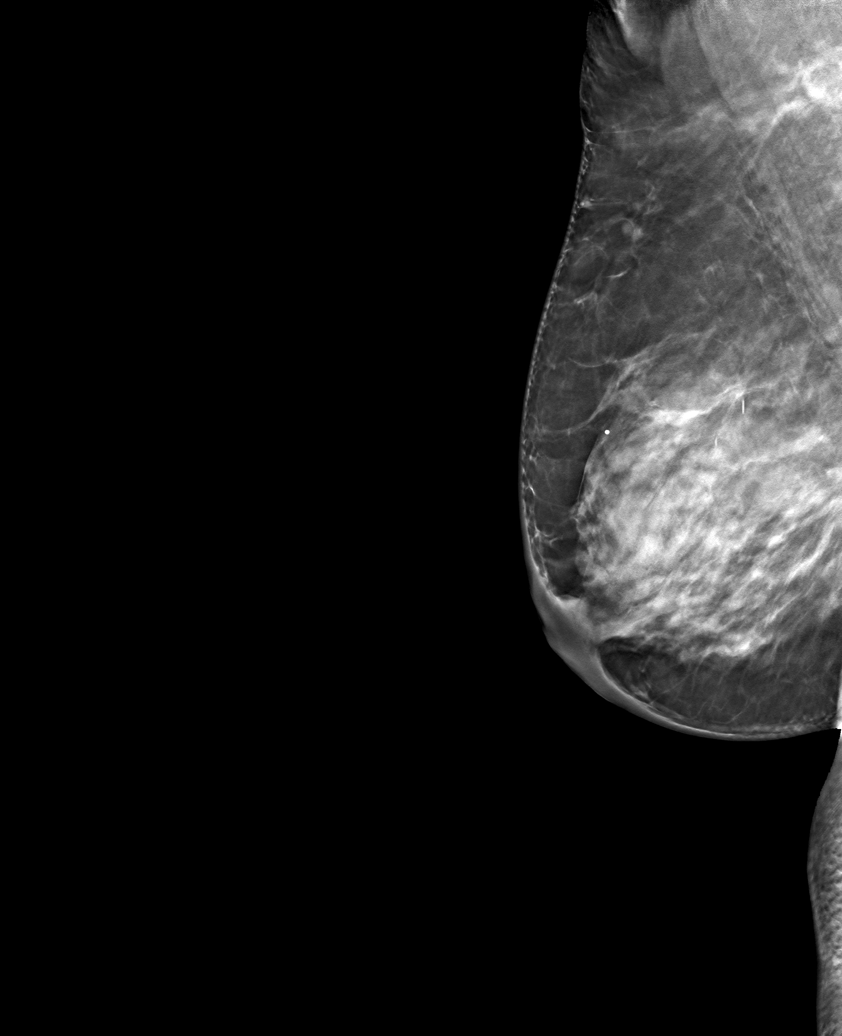

[6 of 25 positions shown; findings below may reference images not displayed]

ACR Breast Density Category c: The breast tissue is heterogeneously
dense, which may obscure small masses.
FINDINGS: Lumpectomy changes are seen in the upper-outer quadrant of the right
breast. No suspicious mass or malignant type microcalcifications
identified in either breast.

Mammographic images were processed with CAD.
IMPRESSION: No evidence of malignancy in either breast.

RECOMMENDATION:
Bilateral diagnostic mammogram in 1 year is recommended.

I have discussed the findings and recommendations with the patient.
If applicable, a reminder letter will be sent to the patient
regarding the next appointment.

BI-RADS CATEGORY  2: Benign.

## 2020-10-21 ENCOUNTER — Other Ambulatory Visit: Payer: Self-pay | Admitting: Hematology and Oncology

## 2020-10-21 DIAGNOSIS — Z9889 Other specified postprocedural states: Secondary | ICD-10-CM

## 2020-10-21 DIAGNOSIS — Z853 Personal history of malignant neoplasm of breast: Secondary | ICD-10-CM

## 2020-12-24 ENCOUNTER — Other Ambulatory Visit: Payer: Self-pay | Admitting: Hematology and Oncology

## 2020-12-28 NOTE — Telephone Encounter (Signed)
Office note states this can be refilled until 09/2023. Gardiner Rhyme, RN

## 2021-01-28 NOTE — Progress Notes (Signed)
Patient Care Team: Kathreen Devoid, PA-C as PCP - General (Internal Medicine) Nicholas Lose, MD as Consulting Physician (Hematology and Oncology) Rolm Bookbinder, MD as Consulting Physician (General Surgery)  DIAGNOSIS:    ICD-10-CM   1. Malignant neoplasm of lower-outer quadrant of right breast of female, estrogen receptor positive ([place])  C50.511    Z17.0       SUMMARY OF ONCOLOGIC HISTORY: Oncology History  Malignant neoplasm of lower-outer quadrant of right breast of female, estrogen receptor positive ([place])  03/06/2018 Initial Diagnosis   Screening mammogram detected right breast calcifications, 8 mm mass detected by ultrasound, axilla negative, biopsy revealed grade 2 IDC with high-grade DCIS, ER 100%, PR 1%, Ki-67 30%, HER-2 +3+ by IHC, T1BN0 stage Ia   03/19/2018 Cancer Staging   Staging form: Breast, AJCC 8th Edition - Clinical stage from 03/19/2018: Stage IA (cT1b, cN0, cM0, G2, ER+, PR+, HER2+)   03/24/2018 Surgery   Right lumpectomy [name]) [phone]): IDC with DCIS, grade 3, 0.6cm, ER+ (100%), PR+ (1%), HER2+ (3+), Ki67 30%, clear margins, 4 SLN negative for carcinoma.    03/24/2018 Cancer Staging   Staging form: Breast, AJCC 8th Edition - Pathologic stage from 03/24/2018: Stage IA (pT1b, pN0, cM0, G3, ER+, PR-, HER2+)   05/14/2018 - 04/27/2019 Chemotherapy   trastuzumab (HERCEPTIN) 300 mg in sodium chloride 0.9 % 250 mL chemo infusion, 273 mg, Intravenous,  Once, 4 of 4 cycles. Dose modification: 6 mg/kg (original dose 2 mg/kg, Cycle 3, Reason: Other (see comments), Comment: switch to maintenance Herceptin - every 3 weeks), 6 mg/kg (original dose 6 mg/kg, Cycle 4, Reason: Other (see comments), Comment: bios not approved, herc again today as it is appr). Administration: 300 mg (05/14/2018), 150 mg (05/20/2018), 150 mg (06/10/2018), 147 mg (05/27/2018), 147 mg (06/03/2018), 150 mg (06/17/2018), 150 mg (06/24/2018), 150 mg (07/01/2018), 150 mg (07/08/2018), 150 mg (07/15/2018),  150 mg (07/22/2018), 450 mg (07/29/2018), 450 mg (08/18/2018)  PACLitaxel (TAXOL) 138 mg in sodium chloride 0.9 % 250 mL chemo infusion (</= 2m/m2), 80 mg/m2 = 138 mg, Intravenous,  Once, 3 of 3 cycles. Dose modification: 65 mg/m2 (original dose 80 mg/m2, Cycle 2, Reason: Dose not tolerated), 50 mg/m2 (original dose 80 mg/m2, Cycle 3, Reason: Dose not tolerated) Administration: 138 mg (05/14/2018), 138 mg (05/20/2018), 114 mg (06/10/2018), 114 mg (05/27/2018), 114 mg (06/03/2018), 114 mg (06/17/2018), 114 mg (06/24/2018), 114 mg (07/01/2018), 114 mg (07/08/2018), 90 mg (07/15/2018), 90 mg (07/22/2018)  trastuzumab-dkst (OGIVRI) 450 mg in sodium chloride 0.9 % 250 mL chemo infusion, 450 mg (100 % of original dose 450 mg), Intravenous,  Once, 13 of 13 cycles. Dose modification: 450 mg (original dose 450 mg, Cycle 4, Reason: Other (see comments), Comment: Biosimilar Conversion). Administration: 450 mg (09/09/2018), 450 mg (09/29/2018), 450 mg (10/20/2018), 450 mg (11/10/2018), 450 mg (12/01/2018), 450 mg (12/22/2018), 450 mg (01/12/2019), 450 mg (02/02/2019), 450 mg (02/23/2019), 450 mg (03/16/2019), 450 mg (04/06/2019), 450 mg (04/27/2019)   08/2018 -  Radiation Therapy   [facility]   09/2018 - 09/2023 Anti-estrogen oral therapy   Tamoxifen     CHIEF COMPLIANT: Follow-up of right breast cancer on tamoxifen  INTERVAL HISTORY: Kim Pinetteis a 61y.o. with above-mentioned history of right breast cancer who underwent a lumpectomy, adjuvant chemotherapy, radiation,  Herceptin maintenance, and is currently on antiestrogen therapy with tamoxifen. Mammogram on 03/14/2020 showed no evidence of malignancy. She presents to the clinic today for follow-up.  She is tolerating  tamoxifen extremely well without any problems or concerns.  Denies any lumps or nodules in the breast.  ALLERGIES:  has No Known Allergies.  MEDICATIONS:  Current Outpatient Medications  Medication Sig Dispense Refill    albuterol (PROVENTIL HFA;VENTOLIN HFA) 108 (90 Base) MCG/ACT inhaler Inhale into the lungs every 6 (six) hours as needed for wheezing or shortness of breath.     atenolol-chlorthalidone (TENORETIC) 100-25 MG tablet Take 1 tablet by mouth daily.     cholecalciferol (VITAMIN D3) 25 MCG (1000 UNIT) tablet Take 2,000 Units by mouth daily.     clonazePAM (KLONOPIN) 0.5 MG tablet Take 0.5 mg by mouth at bedtime.     diclofenac Sodium (VOLTAREN) 1 % GEL Apply 1 % topically 2 (two) times daily as needed.      Melatonin 10 MG TABS Take by mouth.     omeprazole (PRILOSEC) 20 MG capsule Take 20 mg by mouth daily as needed.      potassium chloride SA (K-DUR,KLOR-CON) 20 MEQ tablet Take 20 mEq by mouth 2 (two) times daily.     tamoxifen (NOLVADEX) 20 MG tablet TAKE 1 TABLET BY MOUTH EVERY DAY 90 tablet 3   No current facility-administered medications for this visit.    PHYSICAL EXAMINATION: ECOG PERFORMANCE STATUS: 1 - Symptomatic but completely ambulatory  Vitals:   01/30/21 1531  BP: 129/69  Pulse: 73  Resp: 18  Temp: 97.7 F (36.5 C)  SpO2: 99%   Filed Weights   01/30/21 1531  Weight: 156 lb 3 oz (70.8 kg)    BREAST: No palpable masses or nodules in either right or left breasts. No palpable axillary supraclavicular or infraclavicular adenopathy no breast tenderness or nipple discharge. (exam performed in the presence of a chaperone)  LABORATORY DATA:  I have reviewed the data as listed CMP Latest Ref Rng & Units 04/27/2019 02/23/2019 01/12/2019  Glucose 70 - 99 mg/dL 99 87 98  BUN 6 - 20 mg/dL '11 14 16  ' Creatinine 0.44 - 1.00 mg/dL 0.84 0.75 0.78  Sodium 135 - 145 mmol/L 142 142 138  Potassium 3.5 - 5.1 mmol/L 3.9 3.7 3.8  Chloride 98 - 111 mmol/L 105 105 104  CO2 22 - 32 mmol/L '27 27 28  ' Calcium 8.9 - 10.3 mg/dL 9.4 8.9 9.1  Total Protein 6.5 - 8.1 g/dL 6.8 6.9 6.8  Total Bilirubin 0.3 - 1.2 mg/dL 0.6 0.3 0.5  Alkaline Phos 38 - 126 U/L 76 78 73  AST 15 - 41 U/L 35 34 38  ALT 0 -  44 U/L 29 36 34    Lab Results  Component Value Date   WBC 5.5 04/27/2019   HGB 12.2 04/27/2019   HCT 36.4 04/27/2019   MCV 91.9 04/27/2019   PLT 149 (L) 04/27/2019   NEUTROABS 3.2 04/27/2019    ASSESSMENT & PLAN:  Malignant neoplasm of lower-outer quadrant of right breast of female, estrogen receptor positive ([place]) 03/06/2018:Screening mammogram detected right breast calcifications, 8 mm mass detected by ultrasound, axilla negative, biopsy revealed grade 2 IDC with high-grade DCIS, ER 100%, PR 1%, Ki-67 30%, HER-2 +3+ by IHC, T1BN0 stage Ia   3.23.20: Rt Lumpectomy Grade 2 IDC 0.6 cm, 0/4 LN Neg, Er 100%, PR 1%, Her 2: 3+ pos, T1bN0 Stage 1A   Treatment plan: 1. Adj Chemo with Taxol-Herceptin weekly X 11 (stopped early for neuropathy) followed by Herceptin q 3 weeks for a year.  Completed 04/27/2019 2. Adj RT completed September 2020 (Dr. Pablo Ledger) 3. Foll by  Adj Anti estrogen therapy with tamoxifen started September 2020. ------------------------------------------------------------------------------------------------------------------------------------------- Current treatment: Tamoxifen started September 2020. Echocardiogram 11/17/2018: EF 60 to 65%    Chemo-induced peripheral neuropathy:   Marked improvement    Tamoxifen toxicities: Denies any major adverse effects. Occasional hot flashes   Breast cancer surveillance:  Mammogram 03/14/2020: Benign breast density category C Breast exam 01/30/21: Benign   Return to clinic in 1 year for follow-up    No orders of the defined types were placed in this encounter.  The patient has a good understanding of the overall plan. she agrees with it. she will call with any problems that may develop before the next visit here.  Total time spent: 20 mins including face to face time and time spent for planning, charting and coordination of care  Rulon Eisenmenger, MD, MPH 01/30/2021  I, Thana Ates, am acting as scribe for Dr. Nicholas Lose.  I have reviewed the above documentation for accuracy and completeness, and I agree with the above.

## 2021-01-30 ENCOUNTER — Other Ambulatory Visit: Payer: Self-pay

## 2021-01-30 ENCOUNTER — Inpatient Hospital Stay: Payer: Managed Care, Other (non HMO) | Attending: Hematology and Oncology | Admitting: Hematology and Oncology

## 2021-01-30 DIAGNOSIS — Z79810 Long term (current) use of selective estrogen receptor modulators (SERMs): Secondary | ICD-10-CM | POA: Diagnosis not present

## 2021-01-30 DIAGNOSIS — Z79899 Other long term (current) drug therapy: Secondary | ICD-10-CM | POA: Insufficient documentation

## 2021-01-30 DIAGNOSIS — Z170 Estrogen receptor positive status [ER+]: Secondary | ICD-10-CM | POA: Insufficient documentation

## 2021-01-30 DIAGNOSIS — C50511 Malignant neoplasm of lower-outer quadrant of right female breast: Secondary | ICD-10-CM | POA: Diagnosis present

## 2021-01-30 MED ORDER — MAGNESIUM OXIDE -MG SUPPLEMENT 400 (240 MG) MG PO TABS: 400.0000 mg | ORAL_TABLET | Freq: Every day | ORAL | Status: AC

## 2021-01-30 NOTE — Assessment & Plan Note (Signed)
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 Adj Chemo with Taxol-Herceptin weekly X 11 (stopped early for neuropathy)followed by Herceptin q 3 weeks for a year.  Completed 04/27/2019 2. Adj RTcompleted September 2020(Dr. Wentworth) 3. Foll by Adj Anti estrogen therapywith tamoxifen started September 2020. ------------------------------------------------------------------------------------------------------------------------------------------- Current treatment: Tamoxifen started September 2020. Echocardiogram 11/17/2018: EF 60 to 65%   Chemo-induced peripheral neuropathy:Marked improvement Carpal tunnel syndrome  Tamoxifen toxicities: Denies any major adverse effects. Occasional hot flashes  Breast cancer surveillance: Mammogram 03/14/2020: Benign breast density category C Breast exam 01/30/21: Benign  Return to clinic in 1 year for follow-up

## 2021-03-24 ENCOUNTER — Other Ambulatory Visit: Payer: Self-pay

## 2021-03-24 ENCOUNTER — Ambulatory Visit
Admission: RE | Admit: 2021-03-24 | Discharge: 2021-03-24 | Disposition: A | Discharge status: Home / Self Care | Payer: Managed Care, Other (non HMO) | Source: Ambulatory Visit | Attending: Hematology and Oncology | Admitting: Hematology and Oncology

## 2021-03-24 ENCOUNTER — Other Ambulatory Visit: Payer: Self-pay | Admitting: Hematology and Oncology

## 2021-03-24 DIAGNOSIS — Z9889 Other specified postprocedural states: Secondary | ICD-10-CM

## 2021-03-24 DIAGNOSIS — Z1231 Encounter for screening mammogram for malignant neoplasm of breast: Secondary | ICD-10-CM

## 2021-03-24 DIAGNOSIS — Z853 Personal history of malignant neoplasm of breast: Secondary | ICD-10-CM

## 2021-03-27 ENCOUNTER — Other Ambulatory Visit: Payer: Self-pay | Admitting: Hematology and Oncology

## 2021-03-27 DIAGNOSIS — R928 Other abnormal and inconclusive findings on diagnostic imaging of breast: Secondary | ICD-10-CM

## 2021-04-11 ENCOUNTER — Ambulatory Visit
Admission: RE | Admit: 2021-04-11 | Discharge: 2021-04-11 | Disposition: A | Discharge status: Home / Self Care | Payer: Managed Care, Other (non HMO) | Source: Ambulatory Visit | Attending: Hematology and Oncology | Admitting: Hematology and Oncology

## 2021-04-11 ENCOUNTER — Other Ambulatory Visit: Payer: Self-pay | Admitting: Hematology and Oncology

## 2021-04-11 DIAGNOSIS — R928 Other abnormal and inconclusive findings on diagnostic imaging of breast: Secondary | ICD-10-CM

## 2021-04-11 DIAGNOSIS — N6310 Unspecified lump in the right breast, unspecified quadrant: Secondary | ICD-10-CM

## 2021-04-14 ENCOUNTER — Other Ambulatory Visit: Payer: Self-pay | Admitting: Hematology and Oncology

## 2021-04-14 DIAGNOSIS — N6310 Unspecified lump in the right breast, unspecified quadrant: Secondary | ICD-10-CM

## 2021-04-17 ENCOUNTER — Ambulatory Visit
Admission: RE | Admit: 2021-04-17 | Discharge: 2021-04-17 | Disposition: A | Discharge status: Home / Self Care | Payer: Managed Care, Other (non HMO) | Source: Ambulatory Visit | Attending: Hematology and Oncology | Admitting: Hematology and Oncology

## 2021-04-17 DIAGNOSIS — N6310 Unspecified lump in the right breast, unspecified quadrant: Secondary | ICD-10-CM

## 2021-07-01 ENCOUNTER — Other Ambulatory Visit: Payer: Self-pay | Admitting: Nurse Practitioner

## 2021-12-22 ENCOUNTER — Other Ambulatory Visit: Payer: Self-pay | Admitting: Hematology and Oncology

## 2022-02-02 ENCOUNTER — Other Ambulatory Visit: Payer: Self-pay | Admitting: Hematology and Oncology

## 2022-02-02 DIAGNOSIS — Z1231 Encounter for screening mammogram for malignant neoplasm of breast: Secondary | ICD-10-CM

## 2022-02-05 ENCOUNTER — Other Ambulatory Visit: Payer: Self-pay

## 2022-02-05 ENCOUNTER — Inpatient Hospital Stay: Payer: Managed Care, Other (non HMO) | Attending: Hematology and Oncology | Admitting: Hematology and Oncology

## 2022-02-05 VITALS — BP 142/65 | HR 76 | Temp 97.3°F | Resp 19 | Wt 163.2 lb

## 2022-02-05 DIAGNOSIS — Z170 Estrogen receptor positive status [ER+]: Secondary | ICD-10-CM | POA: Diagnosis not present

## 2022-02-05 DIAGNOSIS — C50511 Malignant neoplasm of lower-outer quadrant of right female breast: Secondary | ICD-10-CM | POA: Insufficient documentation

## 2022-02-05 DIAGNOSIS — Z79810 Long term (current) use of selective estrogen receptor modulators (SERMs): Secondary | ICD-10-CM | POA: Diagnosis not present

## 2022-02-05 NOTE — Progress Notes (Signed)
Patient Care Team: Kathreen Devoid, PA-C as PCP - General (Internal Medicine) Nicholas Lose, MD as Consulting Physician (Hematology and Oncology) Rolm Bookbinder, MD as Consulting Physician (General Surgery)  DIAGNOSIS:  Encounter Diagnosis  Name Primary?   Malignant neoplasm of lower-outer quadrant of right breast of female, estrogen receptor positive ([place]) Yes    SUMMARY OF ONCOLOGIC HISTORY: Oncology History  Malignant neoplasm of lower-outer quadrant of right breast of female, estrogen receptor positive ([place])  03/06/2018 Initial Diagnosis   Screening mammogram detected right breast calcifications, 8 mm mass detected by ultrasound, axilla negative, biopsy revealed grade 2 IDC with high-grade DCIS, ER 100%, PR 1%, Ki-67 30%, HER-2 +3+ by IHC, T1BN0 stage Ia   03/19/2018 Cancer Staging   Staging form: Breast, AJCC 8th Edition - Clinical stage from 03/19/2018: Stage IA (cT1b, cN0, cM0, G2, ER+, PR+, HER2+)   03/24/2018 Surgery   Right lumpectomy [name]) [phone]): IDC with DCIS, grade 3, 0.6cm, ER+ (100%), PR+ (1%), HER2+ (3+), Ki67 30%, clear margins, 4 SLN negative for carcinoma.    03/24/2018 Cancer Staging   Staging form: Breast, AJCC 8th Edition - Pathologic stage from 03/24/2018: Stage IA (pT1b, pN0, cM0, G3, ER+, PR-, HER2+)   05/14/2018 - 04/27/2019 Chemotherapy   trastuzumab (HERCEPTIN) 300 mg in sodium chloride 0.9 % 250 mL chemo infusion, 273 mg, Intravenous,  Once, 4 of 4 cycles. Dose modification: 6 mg/kg (original dose 2 mg/kg, Cycle 3, Reason: Other (see comments), Comment: switch to maintenance Herceptin - every 3 weeks), 6 mg/kg (original dose 6 mg/kg, Cycle 4, Reason: Other (see comments), Comment: bios not approved, herc again today as it is appr). Administration: 300 mg (05/14/2018), 150 mg (05/20/2018), 150 mg (06/10/2018), 147 mg (05/27/2018), 147 mg (06/03/2018), 150 mg (06/17/2018), 150 mg (06/24/2018), 150 mg (07/01/2018), 150 mg (07/08/2018), 150 mg  (07/15/2018), 150 mg (07/22/2018), 450 mg (07/29/2018), 450 mg (08/18/2018)  PACLitaxel (TAXOL) 138 mg in sodium chloride 0.9 % 250 mL chemo infusion (</= '80mg'$ /m2), 80 mg/m2 = 138 mg, Intravenous,  Once, 3 of 3 cycles. Dose modification: 65 mg/m2 (original dose 80 mg/m2, Cycle 2, Reason: Dose not tolerated), 50 mg/m2 (original dose 80 mg/m2, Cycle 3, Reason: Dose not tolerated) Administration: 138 mg (05/14/2018), 138 mg (05/20/2018), 114 mg (06/10/2018), 114 mg (05/27/2018), 114 mg (06/03/2018), 114 mg (06/17/2018), 114 mg (06/24/2018), 114 mg (07/01/2018), 114 mg (07/08/2018), 90 mg (07/15/2018), 90 mg (07/22/2018)  trastuzumab-dkst (OGIVRI) 450 mg in sodium chloride 0.9 % 250 mL chemo infusion, 450 mg (100 % of original dose 450 mg), Intravenous,  Once, 13 of 13 cycles. Dose modification: 450 mg (original dose 450 mg, Cycle 4, Reason: Other (see comments), Comment: Biosimilar Conversion). Administration: 450 mg (09/09/2018), 450 mg (09/29/2018), 450 mg (10/20/2018), 450 mg (11/10/2018), 450 mg (12/01/2018), 450 mg (12/22/2018), 450 mg (01/12/2019), 450 mg (02/02/2019), 450 mg (02/23/2019), 450 mg (03/16/2019), 450 mg (04/06/2019), 450 mg (04/27/2019)   08/2018 -  Radiation Therapy   Adjuvant XRT at [facility] with Dr. Pablo Ledger    09/2018 - 09/2023 Anti-estrogen oral therapy   Tamoxifen     CHIEF COMPLIANT:  Follow-up of right breast cancer on tamoxifen    INTERVAL HISTORY: Kim Montgomery is a 62 y.o. with above-mentioned history of right breast cancer who underwent a lumpectomy, adjuvant chemotherapy, radiation,  Herceptin maintenance, and is currently on antiestrogen therapy with tamoxifen.   She states that her health has been good. She denies any hot flashes or joint stiffness. She says she does get irritated  easily. She denies any pain or discomfort in breast. She says the neuropathy is tolerable. She says she try's to exercise at home and she walks with a friend when the weather is normal.  ALLERGIES:  has No Known  Allergies.  MEDICATIONS:  Current Outpatient Medications  Medication Sig Dispense Refill   albuterol (PROVENTIL HFA;VENTOLIN HFA) 108 (90 Base) MCG/ACT inhaler Inhale into the lungs every 6 (six) hours as needed for wheezing or shortness of breath.     atenolol-chlorthalidone (TENORETIC) 100-25 MG tablet Take 1 tablet by mouth daily.     cholecalciferol (VITAMIN D3) 25 MCG (1000 UNIT) tablet Take 2,000 Units by mouth daily.     clonazePAM (KLONOPIN) 0.5 MG tablet Take 0.5 mg by mouth at bedtime.     diclofenac Sodium (VOLTAREN) 1 % GEL Apply 1 % topically 2 (two) times daily as needed.      magnesium oxide (MAG-OX) 400 (240 Mg) MG tablet Take 1 tablet (400 mg total) by mouth daily.     Melatonin 10 MG TABS Take by mouth.     omeprazole (PRILOSEC) 20 MG capsule Take 20 mg by mouth daily as needed.      potassium chloride SA (K-DUR,KLOR-CON) 20 MEQ tablet Take 20 mEq by mouth 2 (two) times daily.     tamoxifen (NOLVADEX) 20 MG tablet TAKE 1 TABLET BY MOUTH EVERY DAY 90 tablet 3   No current facility-administered medications for this visit.    PHYSICAL EXAMINATION: ECOG PERFORMANCE STATUS: 1 - Symptomatic but completely ambulatory  Vitals:   02/05/22 1529  BP: (!) 142/65  Pulse: 76  Resp: 19  Temp: (!) 97.3 F (36.3 C)  SpO2: 99%   Filed Weights   02/05/22 1529  Weight: 163 lb 4 oz (74 kg)    BREAST: No palpable masses or nodules in either right or left breasts. No palpable axillary supraclavicular or infraclavicular adenopathy no breast tenderness or nipple discharge. (exam performed in the presence of a chaperone)  LABORATORY DATA:  I have reviewed the data as listed    Latest Ref Rng & Units 04/27/2019    9:00 AM 02/23/2019    8:05 AM 01/12/2019    8:21 AM  CMP  Glucose 70 - 99 mg/dL 99  87  98   BUN 6 - 20 mg/dL '11  14  16   '$ Creatinine 0.44 - 1.00 mg/dL 0.84  0.75  0.78   Sodium 135 - 145 mmol/L 142  142  138   Potassium 3.5 - 5.1 mmol/L 3.9  3.7  3.8   Chloride 98 -  111 mmol/L 105  105  104   CO2 22 - 32 mmol/L '27  27  28   '$ Calcium 8.9 - 10.3 mg/dL 9.4  8.9  9.1   Total Protein 6.5 - 8.1 g/dL 6.8  6.9  6.8   Total Bilirubin 0.3 - 1.2 mg/dL 0.6  0.3  0.5   Alkaline Phos 38 - 126 U/L 76  78  73   AST 15 - 41 U/L 35  34  38   ALT 0 - 44 U/L 29  36  34     Lab Results  Component Value Date   WBC 5.5 04/27/2019   HGB 12.2 04/27/2019   HCT 36.4 04/27/2019   MCV 91.9 04/27/2019   PLT 149 (L) 04/27/2019   NEUTROABS 3.2 04/27/2019    ASSESSMENT & PLAN:  Malignant neoplasm of lower-outer quadrant of right breast of female, estrogen receptor positive ([place])  03/06/2018:Screening mammogram detected right breast calcifications, 8 mm mass detected by ultrasound, axilla negative, biopsy revealed grade 2 IDC with high-grade DCIS, ER 100%, PR 1%, Ki-67 30%, HER-2 +3+ by IHC, T1BN0 stage Ia   3.23.20: Rt Lumpectomy Grade 2 IDC 0.6 cm, 0/4 LN Neg, Er 100%, PR 1%, Her 2: 3+ pos, T1bN0 Stage 1A   Treatment plan: 1. Adj Chemo with Taxol-Herceptin weekly X 11 (stopped early for neuropathy) followed by Herceptin q 3 weeks for a year.  Completed 04/27/2019 2. Adj RT completed September 2020 (Dr. Pablo Ledger) 3. Foll by Adj Anti estrogen therapy with tamoxifen started September 2020. ------------------------------------------------------------------------------------------------------------------------------------------- Current treatment: Tamoxifen started September 2020. Echocardiogram 11/17/2018: EF 60 to 65%    Chemo-induced peripheral neuropathy:   Marked improvement    Tamoxifen toxicities: Denies any major adverse effects. Occasional hot flashes   Breast cancer surveillance:  Mammogram 03/27/2022: Benign breast density category C Breast exam 02/05/2022: Benign   Return to clinic in 1 year for follow-up    No orders of the defined types were placed in this encounter.  The patient has a good understanding of the overall plan. she agrees with it. she will call  with any problems that may develop before the next visit here. Total time spent: 30 mins including face to face time and time spent for planning, charting and co-ordination of care   Harriette Ohara, MD 02/05/22    I Gardiner Coins am acting as a Education administrator for [employer]  I have reviewed the above documentation for accuracy and completeness, and I agree with the above.

## 2022-02-05 NOTE — Assessment & Plan Note (Signed)
03/06/2018:Screening mammogram detected right breast calcifications, 8 mm mass detected by ultrasound, axilla negative, biopsy revealed grade 2 IDC with high-grade DCIS, ER 100%, PR 1%, Ki-67 30%, HER-2 +3+ by IHC, T1BN0 stage Ia   3.23.20: Rt Lumpectomy Grade 2 IDC 0.6 cm, 0/4 LN Neg, Er 100%, PR 1%, Her 2: 3+ pos, T1bN0 Stage 1A   Treatment plan: 1. Adj Chemo with Taxol-Herceptin weekly X 11 (stopped early for neuropathy) followed by Herceptin q 3 weeks for a year.  Completed 04/27/2019 2. Adj RT completed September 2020 (Dr. Pablo Ledger) 3. Foll by Adj Anti estrogen therapy with tamoxifen started September 2020. ------------------------------------------------------------------------------------------------------------------------------------------- Current treatment: Tamoxifen started September 2020. Echocardiogram 11/17/2018: EF 60 to 65%    Chemo-induced peripheral neuropathy:   Marked improvement    Tamoxifen toxicities: Denies any major adverse effects. Occasional hot flashes   Breast cancer surveillance:  Mammogram 03/27/2022: Benign breast density category C Breast exam 02/05/2022: Benign   Return to clinic in 1 year for follow-up

## 2022-03-27 ENCOUNTER — Ambulatory Visit
Admission: RE | Admit: 2022-03-27 | Discharge: 2022-03-27 | Disposition: A | Discharge status: Home / Self Care | Payer: Managed Care, Other (non HMO) | Source: Ambulatory Visit | Attending: Hematology and Oncology | Admitting: Hematology and Oncology

## 2022-03-27 DIAGNOSIS — Z1231 Encounter for screening mammogram for malignant neoplasm of breast: Secondary | ICD-10-CM

## 2022-11-21 NOTE — Telephone Encounter (Signed)
Telephone call  

## 2022-11-22 NOTE — Telephone Encounter (Signed)
Telephone call  

## 2022-12-19 ENCOUNTER — Other Ambulatory Visit: Payer: Self-pay | Admitting: Hematology and Oncology

## 2023-02-11 ENCOUNTER — Inpatient Hospital Stay
Payer: No Typology Code available for payment source | Attending: Hematology and Oncology | Admitting: Hematology and Oncology

## 2023-02-11 VITALS — BP 128/71 | HR 86 | Temp 98.2°F | Resp 18 | Ht 61.0 in | Wt 161.9 lb

## 2023-02-11 DIAGNOSIS — T451X5D Adverse effect of antineoplastic and immunosuppressive drugs, subsequent encounter: Secondary | ICD-10-CM | POA: Insufficient documentation

## 2023-02-11 DIAGNOSIS — Z170 Estrogen receptor positive status [ER+]: Secondary | ICD-10-CM | POA: Insufficient documentation

## 2023-02-11 DIAGNOSIS — R232 Flushing: Secondary | ICD-10-CM | POA: Diagnosis not present

## 2023-02-11 DIAGNOSIS — G4700 Insomnia, unspecified: Secondary | ICD-10-CM | POA: Insufficient documentation

## 2023-02-11 DIAGNOSIS — Z79899 Other long term (current) drug therapy: Secondary | ICD-10-CM | POA: Diagnosis not present

## 2023-02-11 DIAGNOSIS — G620 Drug-induced polyneuropathy: Secondary | ICD-10-CM | POA: Insufficient documentation

## 2023-02-11 DIAGNOSIS — Z923 Personal history of irradiation: Secondary | ICD-10-CM | POA: Diagnosis not present

## 2023-02-11 DIAGNOSIS — Z79810 Long term (current) use of selective estrogen receptor modulators (SERMs): Secondary | ICD-10-CM | POA: Diagnosis not present

## 2023-02-11 DIAGNOSIS — C50511 Malignant neoplasm of lower-outer quadrant of right female breast: Secondary | ICD-10-CM | POA: Diagnosis not present

## 2023-02-11 DIAGNOSIS — G5600 Carpal tunnel syndrome, unspecified upper limb: Secondary | ICD-10-CM | POA: Diagnosis not present

## 2023-02-11 NOTE — Assessment & Plan Note (Signed)
 03/06/2018:Screening mammogram detected right breast calcifications, 8 mm mass detected by ultrasound, axilla negative, biopsy revealed grade 2 IDC with high-grade DCIS, ER 100%, PR 1%, Ki-67 30%, HER-2 +3+ by IHC, T1BN0 stage Ia   3.23.20: Rt Lumpectomy Grade 2 IDC 0.6 cm, 0/4 LN Neg, Er 100%, PR 1%, Her 2: 3+ pos, T1bN0 Stage 1A   Treatment plan: 1. Adj Chemo with Taxol -Herceptin  weekly X 11 (stopped early for neuropathy) followed by Herceptin  q 3 weeks for a year.  Completed 04/27/2019 2. Adj RT completed September 2020 (Dr. Katheryn Pandy) 3. Foll by Adj Anti estrogen therapy with tamoxifen  started September 2020. ------------------------------------------------------------------------------------------------------------------------------------------- Current treatment: Tamoxifen  started September 2020. Echocardiogram 11/17/2018: EF 60 to 65%    Chemo-induced peripheral neuropathy:   Marked improvement    Tamoxifen  toxicities: Denies any major adverse effects. Occasional hot flashes   Breast cancer surveillance:  Mammogram 03/28/2022: Benign breast density category C Breast exam 02/11/2023: Benign   Return to clinic in 1 year for follow-up

## 2023-02-11 NOTE — Progress Notes (Signed)
 Patient Care Team: Ritta Chessman, PA-C as PCP - General (Internal Medicine) Cameron Cea, MD as Consulting Physician (Hematology and Oncology) Enid Harry, MD as Consulting Physician (General Surgery)  DIAGNOSIS:  Encounter Diagnosis  Name Primary?   Malignant neoplasm of lower-outer quadrant of right breast of female, estrogen receptor positive ([facility]) Yes    SUMMARY OF ONCOLOGIC HISTORY: Oncology History  Malignant neoplasm of lower-outer quadrant of right breast of female, estrogen receptor positive ([facility])  03/06/2018 Initial Diagnosis   Screening mammogram detected right breast calcifications, 8 mm mass detected by ultrasound, axilla negative, biopsy revealed grade 2 IDC with high-grade DCIS, ER 100%, PR 1%, Ki-67 30%, HER-2 +3+ by IHC, T1BN0 stage Ia   03/19/2018 Cancer Staging   Staging form: Breast, AJCC 8th Edition - Clinical stage from 03/19/2018: Stage IA (cT1b, cN0, cM0, G2, ER+, PR+, HER2+)   03/24/2018 Surgery   Right lumpectomy Delane Fear) [phone]): IDC with DCIS, grade 3, 0.6cm, ER+ (100%), PR+ (1%), HER2+ (3+), Ki67 30%, clear margins, 4 SLN negative for carcinoma.    03/24/2018 Cancer Staging   Staging form: Breast, AJCC 8th Edition - Pathologic stage from 03/24/2018: Stage IA (pT1b, pN0, cM0, G3, ER+, PR-, HER2+)   05/14/2018 - 04/27/2019 Chemotherapy   trastuzumab  (HERCEPTIN ) 300 mg in sodium chloride  0.9 % 250 mL chemo infusion, 273 mg, Intravenous,  Once, 4 of 4 cycles. Dose modification: 6 mg/kg (original dose 2 mg/kg, Cycle 3, Reason: Other (see comments), Comment: switch to maintenance Herceptin  - every 3 weeks), 6 mg/kg (original dose 6 mg/kg, Cycle 4, Reason: Other (see comments), Comment: bios not approved, herc again today as it is appr). Administration: 300 mg (05/14/2018), 150 mg (05/20/2018), 150 mg (06/10/2018), 147 mg (05/27/2018), 147 mg (06/03/2018), 150 mg (06/17/2018), 150 mg (06/24/2018), 150 mg (07/01/2018), 150 mg (07/08/2018), 150 mg  (07/15/2018), 150 mg (07/22/2018), 450 mg (07/29/2018), 450 mg (08/18/2018)  PACLitaxel  (TAXOL ) 138 mg in sodium chloride  0.9 % 250 mL chemo infusion (</= 80mg /m2), 80 mg/m2 = 138 mg, Intravenous,  Once, 3 of 3 cycles. Dose modification: 65 mg/m2 (original dose 80 mg/m2, Cycle 2, Reason: Dose not tolerated), 50 mg/m2 (original dose 80 mg/m2, Cycle 3, Reason: Dose not tolerated) Administration: 138 mg (05/14/2018), 138 mg (05/20/2018), 114 mg (06/10/2018), 114 mg (05/27/2018), 114 mg (06/03/2018), 114 mg (06/17/2018), 114 mg (06/24/2018), 114 mg (07/01/2018), 114 mg (07/08/2018), 90 mg (07/15/2018), 90 mg (07/22/2018)  trastuzumab -dkst (OGIVRI ) 450 mg in sodium chloride  0.9 % 250 mL chemo infusion, 450 mg (100 % of original dose 450 mg), Intravenous,  Once, 13 of 13 cycles. Dose modification: 450 mg (original dose 450 mg, Cycle 4, Reason: Other (see comments), Comment: Biosimilar Conversion). Administration: 450 mg (09/09/2018), 450 mg (09/29/2018), 450 mg (10/20/2018), 450 mg (11/10/2018), 450 mg (12/01/2018), 450 mg (12/22/2018), 450 mg (01/12/2019), 450 mg (02/02/2019), 450 mg (02/23/2019), 450 mg (03/16/2019), 450 mg (04/06/2019), 450 mg (04/27/2019)   08/2018 -  Radiation Therapy   Adjuvant XRT at [facility] with Dr. Katheryn Pandy    09/2018 - 09/2023 Anti-estrogen oral therapy   Tamoxifen      CHIEF COMPLIANT: Follow-up on tamoxifen  therapy  HISTORY OF PRESENT ILLNESS:  History of Present Illness   Kim Montgomery "Kim Montgomery" is a 63 year old female who presents for a follow-up visit regarding her ongoing treatment and side effects.  She experiences occasional hot flashes, which are manageable. No chest pain or discomfort is reported. She had her mammogram in March.  She has ongoing neuropathy from chemotherapy, with  pain in her hands attributed to neuropathy rather than arthritis. She manages mild carpal tunnel syndrome in one arm by wearing a wristband overnight, describing it as tolerable.  She was informed of low  magnesium  levels and takes Prilosec frequently. She stopped Prilosec for five days while taking magnesium  supplements without experiencing heartburn and wants to discontinue Prilosec permanently. She is no longer taking Klonopin and has switched to a medication starting with 'T' for sleep, though she has only taken it once. She is attempting to discontinue all sleep aids, including melatonin. She continues to take potassium due to atenolol use, which depletes potassium levels, and acknowledges the likely need for indefinite potassium supplementation. She also takes vitamin D.  She used to enjoy hiking but now finds it difficult due to knee issues, particularly when descending. Walking and ascending are manageable, but descending is challenging, making her appear unsteady. Despite these limitations, she remains active and recently traveled to the [place], visiting several [place] parks.         ALLERGIES:  has no known allergies.  MEDICATIONS:  Current Outpatient Medications  Medication Sig Dispense Refill   albuterol (PROVENTIL HFA;VENTOLIN HFA) 108 (90 Base) MCG/ACT inhaler Inhale into the lungs every 6 (six) hours as needed for wheezing or shortness of breath.     atenolol-chlorthalidone (TENORETIC) 100-25 MG tablet Take 1 tablet by mouth daily.     cholecalciferol (VITAMIN D3) 25 MCG (1000 UNIT) tablet Take 2,000 Units by mouth daily.     diclofenac Sodium (VOLTAREN) 1 % GEL Apply 1 % topically 2 (two) times daily as needed.      magnesium  oxide (MAG-OX) 400 (240 Mg) MG tablet Take 1 tablet (400 mg total) by mouth daily.     Melatonin 10 MG TABS Take by mouth.     omeprazole (PRILOSEC) 20 MG capsule Take 20 mg by mouth daily as needed.      potassium chloride SA (K-DUR,KLOR-CON) 20 MEQ tablet Take 20 mEq by mouth 2 (two) times daily.     tamoxifen  (NOLVADEX ) 20 MG tablet TAKE 1 TABLET BY MOUTH EVERY DAY 90 tablet 1   No current facility-administered medications for this visit.     PHYSICAL EXAMINATION: ECOG PERFORMANCE STATUS: 1 - Symptomatic but completely ambulatory  Vitals:   02/11/23 1515  BP: 128/71  Pulse: 86  Resp: 18  Temp: 98.2 F (36.8 C)  SpO2: 96%   Filed Weights   02/11/23 1515  Weight: 161 lb 14.4 oz (73.4 kg)    Physical Exam no palpable lumps or nodules in bilateral breasts or axilla        (exam performed in the presence of a chaperone)  LABORATORY DATA:  I have reviewed the data as listed    Latest Ref Rng & Units 04/27/2019    9:00 AM 02/23/2019    8:05 AM 01/12/2019    8:21 AM  CMP  Glucose 70 - 99 mg/dL 99  87  98   BUN 6 - 20 mg/dL 11  14  16    Creatinine 0.44 - 1.00 mg/dL 5.40  9.81  1.91   Sodium 135 - 145 mmol/L 142  142  138   Potassium 3.5 - 5.1 mmol/L 3.9  3.7  3.8   Chloride 98 - 111 mmol/L 105  105  104   CO2 22 - 32 mmol/L 27  27  28    Calcium 8.9 - 10.3 mg/dL 9.4  8.9  9.1   Total Protein 6.5 - 8.1 g/dL 6.8  6.9  6.8   Total Bilirubin 0.3 - 1.2 mg/dL 0.6  0.3  0.5   Alkaline Phos 38 - 126 U/L 76  78  73   AST 15 - 41 U/L 35  34  38   ALT 0 - 44 U/L 29  36  34     Lab Results  Component Value Date   WBC 5.5 04/27/2019   HGB 12.2 04/27/2019   HCT 36.4 04/27/2019   MCV 91.9 04/27/2019   PLT 149 (L) 04/27/2019   NEUTROABS 3.2 04/27/2019    ASSESSMENT & PLAN:  Malignant neoplasm of lower-outer quadrant of right breast of female, estrogen receptor positive ([facility]) 03/06/2018:Screening mammogram detected right breast calcifications, 8 mm mass detected by ultrasound, axilla negative, biopsy revealed grade 2 IDC with high-grade DCIS, ER 100%, PR 1%, Ki-67 30%, HER-2 +3+ by IHC, T1BN0 stage Ia   3.23.20: Rt Lumpectomy Grade 2 IDC 0.6 cm, 0/4 LN Neg, Er 100%, PR 1%, Her 2: 3+ pos, T1bN0 Stage 1A   Treatment plan: 1. Adj Chemo with Taxol -Herceptin  weekly X 11 (stopped early for neuropathy) followed by Herceptin  q 3 weeks for a year.  Completed 04/27/2019 2. Adj RT completed September 2020 (Dr. Katheryn Pandy) 3. Foll  by Adj Anti estrogen therapy with tamoxifen  started September 2020. ------------------------------------------------------------------------------------------------------------------------------------------- Current treatment: Tamoxifen  started September 2020. Echocardiogram 11/17/2018: EF 60 to 65%    Chemo-induced peripheral neuropathy:   Marked improvement    Tamoxifen  toxicities: Denies any major adverse effects. Occasional hot flashes   Breast cancer surveillance:  Mammogram 03/28/2022: Benign breast density category C Breast exam 02/11/2023: Benign   Return to clinic in 1 year for follow-up ------------------------------------- Assessment and Plan    Breast Cancer Follow-up In year five of follow-up after breast cancer treatment. No current chest pain or discomfort. Mammogram scheduled for March. No new symptoms reported. Discussed the importance of continued surveillance and the low likelihood of recurrence at this stage. 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 Symptoms are chronic with occasional hand pain. Discussed that neuropathy is a common long-term side effect of chemotherapy and may not improve significantly over time.  Hot Flashes Intermittent hot flashes reported, not severe. Discussed that these are common post-treatment and generally decrease over time.  Carpal Tunnel Syndrome Mild carpal tunnel syndrome in the right arm, managed with a wristband at night. Symptoms are well-controlled. Discussed the benefits of continued use of the wristband and potential need for further intervention if symptoms worsen. - Continue using wristband at night  Hypomagnesemia Recent low magnesium  levels likely secondary to chronic Prilosec use. Completed a five-day course of magnesium  and discontinued Prilosec without recurrence of heartburn. Discussed the benefits of  discontinuing Prilosec to avoid long-term magnesium  depletion. - Monitor magnesium  levels - Continue to avoid Prilosec if possible  Insomnia Attempting to discontinue all sleep aids, including melatonin and Klonopin. Currently using trazodone for sleep but has only taken it once. Discussed the benefits of reducing reliance on sleep aids and potential non-pharmacological approaches. - Continue efforts to discontinue sleep aids  General Health Maintenance Taking vitamin D and potassium supplements. Potassium supplementation is necessary due to atenolol use. Discussed the importance of maintaining these supplements to prevent deficiencies. - Continue vitamin D supplementation - Continue potassium supplementation  Follow-up - Schedule final follow-up visit in one year.          No orders of the defined types  were placed in this encounter.  The patient has a good understanding of the overall plan. she agrees with it. she will call with any problems that may develop before the next visit here. Total time spent: 30 mins including face to face time and time spent for planning, charting and co-ordination of care   Viinay K Dima Mini, MD 02/11/23

## 2023-02-25 ENCOUNTER — Other Ambulatory Visit: Payer: Self-pay | Admitting: Physician Assistant

## 2023-02-25 DIAGNOSIS — Z1231 Encounter for screening mammogram for malignant neoplasm of breast: Secondary | ICD-10-CM

## 2023-04-01 ENCOUNTER — Ambulatory Visit
Admission: RE | Admit: 2023-04-01 | Discharge: 2023-04-01 | Disposition: A | Discharge status: Home / Self Care | Payer: No Typology Code available for payment source | Source: Ambulatory Visit | Attending: Physician Assistant | Admitting: Physician Assistant

## 2023-04-01 DIAGNOSIS — Z1231 Encounter for screening mammogram for malignant neoplasm of breast: Secondary | ICD-10-CM

## 2023-04-24 ENCOUNTER — Telehealth: Payer: Self-pay | Admitting: *Deleted

## 2023-04-24 NOTE — Telephone Encounter (Signed)
 Received call from pt requesting advice if Tamoxifen  can cause hypomagnesia. Per MD there is no correlation between the two.  Pt educated and states she will f/u with PCP on Mag checks.

## 2023-05-22 ENCOUNTER — Other Ambulatory Visit: Payer: Self-pay | Admitting: Obstetrics and Gynecology

## 2023-05-22 DIAGNOSIS — Z853 Personal history of malignant neoplasm of breast: Secondary | ICD-10-CM

## 2023-06-14 ENCOUNTER — Other Ambulatory Visit: Payer: Self-pay | Admitting: Hematology and Oncology

## 2023-08-19 ENCOUNTER — Other Ambulatory Visit

## 2023-09-12 ENCOUNTER — Ambulatory Visit
Admission: RE | Admit: 2023-09-12 | Discharge: 2023-09-12 | Disposition: A | Discharge status: Home / Self Care | Source: Ambulatory Visit | Attending: Obstetrics and Gynecology | Admitting: Obstetrics and Gynecology

## 2023-09-12 DIAGNOSIS — Z853 Personal history of malignant neoplasm of breast: Secondary | ICD-10-CM

## 2023-09-12 MED ORDER — GADOPICLENOL 0.5 MMOL/ML IV SOLN
7.0000 mL | Freq: Once | INTRAVENOUS | Status: AC | PRN
Start: 2023-09-12 — End: 2023-09-12
  Administered 2023-09-12: 7 mL via INTRAVENOUS

## 2023-09-16 ENCOUNTER — Other Ambulatory Visit: Payer: Self-pay | Admitting: Obstetrics and Gynecology

## 2023-09-16 DIAGNOSIS — R599 Enlarged lymph nodes, unspecified: Secondary | ICD-10-CM

## 2023-09-27 ENCOUNTER — Other Ambulatory Visit

## 2023-10-01 ENCOUNTER — Ambulatory Visit
Admission: RE | Admit: 2023-10-01 | Discharge: 2023-10-01 | Disposition: A | Source: Ambulatory Visit | Attending: Obstetrics and Gynecology | Admitting: Obstetrics and Gynecology

## 2023-10-01 ENCOUNTER — Ambulatory Visit
Admission: RE | Admit: 2023-10-01 | Discharge: 2023-10-01 | Disposition: A | Discharge status: Home / Self Care | Source: Ambulatory Visit | Attending: Obstetrics and Gynecology | Admitting: Obstetrics and Gynecology

## 2023-10-01 DIAGNOSIS — R599 Enlarged lymph nodes, unspecified: Secondary | ICD-10-CM

## 2023-10-14 ENCOUNTER — Telehealth: Payer: Self-pay

## 2023-10-14 NOTE — Telephone Encounter (Signed)
 S/w patient regarding questions on Tamoxifen . Patient will complete 5-years of Tamoxifen  therapy on 11/05/23. Patient states that she has enough medication to last her through December and wanted clarification if she should continue to medication until it is finished or stop taking it in November at the 5-year mark. Per Dr. Gudena, patient can stop taking the Tamoxifen  in November.  Patient verbalized an understanding of the information. All questions answered at time of call.

## 2023-12-08 ENCOUNTER — Other Ambulatory Visit: Payer: Self-pay | Admitting: Hematology and Oncology

## 2024-02-17 ENCOUNTER — Inpatient Hospital Stay: Payer: No Typology Code available for payment source | Admitting: Hematology and Oncology
